# Patient Record
Sex: Female | Born: 1950 | Race: White | Hispanic: No | Marital: Married
Health system: Midwestern US, Community
[De-identification: ages and names within clinical notes are randomized; demographics above are authoritative.]

## PROBLEM LIST (undated history)

## (undated) DIAGNOSIS — I2510 Atherosclerotic heart disease of native coronary artery without angina pectoris: Secondary | ICD-10-CM

## (undated) DIAGNOSIS — Z8709 Personal history of other diseases of the respiratory system: Secondary | ICD-10-CM

## (undated) DIAGNOSIS — L989 Disorder of the skin and subcutaneous tissue, unspecified: Secondary | ICD-10-CM

## (undated) DIAGNOSIS — E785 Hyperlipidemia, unspecified: Secondary | ICD-10-CM

## (undated) DIAGNOSIS — R5383 Other fatigue: Secondary | ICD-10-CM

## (undated) DIAGNOSIS — F411 Generalized anxiety disorder: Secondary | ICD-10-CM

## (undated) DIAGNOSIS — J439 Emphysema, unspecified: Secondary | ICD-10-CM

## (undated) DIAGNOSIS — R140 Abdominal distension (gaseous): Secondary | ICD-10-CM

## (undated) DIAGNOSIS — L299 Pruritus, unspecified: Secondary | ICD-10-CM

## (undated) DIAGNOSIS — I10 Essential (primary) hypertension: Secondary | ICD-10-CM

## (undated) DIAGNOSIS — F32A Depression, unspecified: Secondary | ICD-10-CM

## (undated) DIAGNOSIS — M25551 Pain in right hip: Secondary | ICD-10-CM

## (undated) DIAGNOSIS — M549 Dorsalgia, unspecified: Secondary | ICD-10-CM

## (undated) DIAGNOSIS — I4891 Unspecified atrial fibrillation: Secondary | ICD-10-CM

## (undated) DIAGNOSIS — F410 Panic disorder [episodic paroxysmal anxiety] without agoraphobia: Secondary | ICD-10-CM

## (undated) DIAGNOSIS — M25562 Pain in left knee: Principal | ICD-10-CM

## (undated) DIAGNOSIS — D509 Iron deficiency anemia, unspecified: Secondary | ICD-10-CM

## (undated) DIAGNOSIS — F419 Anxiety disorder, unspecified: Secondary | ICD-10-CM

## (undated) DIAGNOSIS — R918 Other nonspecific abnormal finding of lung field: Secondary | ICD-10-CM

## (undated) DIAGNOSIS — D508 Other iron deficiency anemias: Secondary | ICD-10-CM

## (undated) DIAGNOSIS — E278 Other specified disorders of adrenal gland: Secondary | ICD-10-CM

## (undated) DIAGNOSIS — M47816 Spondylosis without myelopathy or radiculopathy, lumbar region: Principal | ICD-10-CM

## (undated) DIAGNOSIS — M25561 Pain in right knee: Secondary | ICD-10-CM

## (undated) DIAGNOSIS — E559 Vitamin D deficiency, unspecified: Secondary | ICD-10-CM

## (undated) DIAGNOSIS — D649 Anemia, unspecified: Secondary | ICD-10-CM

## (undated) DIAGNOSIS — E611 Iron deficiency: Secondary | ICD-10-CM

## (undated) DIAGNOSIS — I480 Paroxysmal atrial fibrillation: Secondary | ICD-10-CM

## (undated) HISTORY — PX: OTHER SURGICAL HISTORY: SHX169

## (undated) HISTORY — DX: Emphysema, unspecified: J43.9

## (undated) HISTORY — DX: Abdominal distension (gaseous): R14.0

## (undated) HISTORY — DX: Disorder of the skin and subcutaneous tissue, unspecified: L98.9

## (undated) HISTORY — DX: Essential (primary) hypertension: I10

## (undated) HISTORY — DX: Panic disorder (episodic paroxysmal anxiety): F41.0

## (undated) HISTORY — DX: Depression, unspecified: F32.A

## (undated) HISTORY — PX: APPENDECTOMY: SHX54

## (undated) HISTORY — DX: Atherosclerotic heart disease of native coronary artery without angina pectoris: I25.10

## (undated) HISTORY — DX: Personal history of other diseases of the respiratory system: Z87.09

## (undated) HISTORY — DX: Generalized anxiety disorder: F41.1

## (undated) HISTORY — PX: NECK SURGERY: SHX720

## (undated) HISTORY — DX: Hyperlipidemia, unspecified: E78.5

## (undated) HISTORY — DX: Dorsalgia, unspecified: M54.9

## (undated) HISTORY — DX: Pain in right hip: M25.551

## (undated) HISTORY — DX: Unspecified atrial fibrillation: I48.91

## (undated) HISTORY — DX: Other fatigue: R53.83

## (undated) HISTORY — DX: Pruritus, unspecified: L29.9

---

## 1998-02-24 HISTORY — PX: BREAST BIOPSY: SHX20

## 1998-02-24 HISTORY — PX: BREAST EXCISIONAL BIOPSY: SUR124

## 2016-01-03 NOTE — ED Provider Notes (Signed)
Formatting of this note is different from the original.      DOS: 01/03/2016    Chief Complaint   Patient presents with   ? Medical Evaluation     pt reports pain in bilateral arms that radiates up to shoulder with numbness and tingling in fingers     HPI  The patient is a 65 y.o. female who presents today with Medical Evaluation (pt reports pain in bilateral arms that radiates up to shoulder with numbness and tingling in fingers)  HPI Comments: Chief complaint of shoulder pain.  Patient states that 5 days ago she was sitting at her kitchen table when she had a sudden sensation of bilateral upper extremity numbness and tingling with a brief episode of "heartburn".  She was a little bit lightheaded, but discomfort past after only a minute or so.    Last night, she felt like she was having difficulty and left shoulder and arm pain and with no associated symptoms.    This morning, she had another episode of the lateral upper extremity discomfort and tingling which again only lasted a few moments, now has some left shoulder and arm pain.  Today she also had the associated epigastric discomfort and lightheadedness, but these have passed.     Patient has not had shortness of breath, diaphoresis, vomiting or nausea.      Patient called her PCP and was referred to the emergency room for concerns about cardiac issues.  Patient has no history of cardiac disease.  She does smoke cigarettes and has been treated occasionally for hypertension.      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  No increased neck pain recently.    The history is provided by the patient.   Medical Evaluation   Associated symptoms: no abdominal pain, no chest pain, no congestion, no cough, no diarrhea, no ear pain, no fever, no headaches, no nausea, no rash, no rhinorrhea, no shortness of breath, no sore throat and no vomiting        Review of Systems    Review of  Systems   Constitutional: Negative for activity change, chills and fever.   HENT: Negative for congestion, ear pain, rhinorrhea and sore throat.    Eyes: Negative for pain, redness and visual disturbance.   Respiratory: Positive for chest tightness. Negative for cough and shortness of breath.    Cardiovascular: Negative for chest pain and leg swelling.   Gastrointestinal: Negative for abdominal pain, diarrhea, nausea and vomiting.   Genitourinary: Negative for dysuria, flank pain and pelvic pain.   Musculoskeletal: Negative for back pain and neck pain.   Skin: Negative for color change and rash.   Neurological: Positive for light-headedness. Negative for dizziness, syncope, weakness and headaches.   Psychiatric/Behavioral: Negative for agitation and behavioral problems.          The patient's past medical, family and social history was reviewed and updated as needed.        No Known Allergies    Vital Signs  Temp: 36.2 C (97.2 F)  Temp src: Temporal  Pulse: 87  Heart Rate: 82 BPM  Resp: 16  SpO2: 98 %  BP: 124/55   O2 Device: None (Room air)    Physical Exam   Constitutional: She is oriented to person, place, and time. She appears well-developed and well-nourished. No distress.   HENT:   Mouth/Throat: Oropharynx is  clear and moist. No oropharyngeal exudate.   Eyes: Conjunctivae and EOM are normal. Pupils are equal, round, and reactive to light.   Cardiovascular: Regular rhythm.    Pulmonary/Chest: Effort normal and breath sounds normal. No respiratory distress. She has no wheezes. She has no rales. She exhibits no tenderness.   Abdominal: She exhibits no distension and no mass. There is no tenderness. There is no rebound and no guarding.   Musculoskeletal: Normal range of motion. She exhibits tenderness (mild diffuse left shoulder soft tissue tenderness).   Neurological: She is alert and oriented to person, place, and time.   Skin: Skin is warm and dry.   Psychiatric: She has a normal mood and affect. Her behavior  is normal. Judgment and thought content normal.   Nursing note and vitals reviewed.    RESULTS    Radiology orders: None      ED Lab Results   Labs Reviewed   BASIC METABOLIC PANEL (BMP)       Result Value Status    Sodium 144   Final    Potassium 3.9   Final    Chloride 104   Final    CO2 30   Final    BUN 16   Final    Creatinine 0.65   Final    GFR, Calculated 95   Final    Calcium 9.5   Final    Calculated Calcium 8.9   Final    Glucose, Serum 98   Final    Fasting? Unknown   Final   HEMAGRAM AND DIFFERENTIAL    WBC 6.62   Final    RBC 4.18   Final    Hemoglobin 13.2   Final    HCT 39.8   Final    MCV 95   Final    MCH 31.6   Final    MCHC 33.2   Final    RDW-CV 13.1   Final    RDW-SD 45.6   Final    PLT 276   Final    MPV 9.5   Final    Neutrophils 58.6   Final    Lymphocytes 29.8   Final    Monocytes 6.6   Final    Eosinophils 3.6   Final    Basophils 0.8   Final    Immature Grans 0.6   Final    ABS Neutrophils 3.88   Final    ABS Lymphs 1.97   Final    ABS Monocytes 0.44   Final    ABS Eosinophils 0.24   Final    ABS Basophils 0.05   Final    ABS Immature Grans 0.04   Final    Type of Diff: Automated   Final   TROPONIN I    Troponin I <0.034   Final         Relevant Data     Procedures        ED COURSE   A medical screening exam was performed.  On exam , patient looks well with normal vital signs.  She is having 5/10 left shoulder and arm pain currently, with some mild diffuse left shoulder reproducible tenderness and discomfort on movement.  Exam otherwise unremarkable.    EKG today shows no ischemic changes or abnormalities, labs including BMP, CBC and troponin are normal.  With symptoms occurring at rest and involving numbness and tingling in both of the upper extremities, this is sounds unlikely to be cardiac, especially  given normal workup today.    Patient declines Motrin or Tylenol for her shoulder discomfort and will take her own Excedrin at home and follow up with PCP as needed.      ASSESSMENT AND  PLAN  Final diagnoses:   Left shoulder pain, unspecified chronicity     DISPOSITION: Discharged  The patient's pain was managed to an adequate level weighing risk vs. benefit of further medications. Upon departure from the Emergency Department, the patient's pain was 4 on a zero to ten scale.     Any further pain treatment will be at the discretion of the provider following up with the patient based on their clinical assessment.    Condition at departure from the Emergency Department: Stable     PCP:  Duffy BruceJudith Steinberg    MDM  Number of Diagnoses or Management Options  Diagnosis management comments: 4      Amount and/or Complexity of Data Reviewed  Clinical lab tests: ordered  Tests in the medicine section of CPT: reviewed and ordered  Review and summarize past medical records: yes    Rudi RummageJohn Ellis      01/06/2016 10:47    No flowsheet data found.                          Electronically signed by Deretha EmoryJerard, Paul B, PA at 01/06/2016 10:47 EST

## 2016-01-03 NOTE — ED Notes (Signed)
Formatting of this note might be different from the original.  TCALL: (RE-ENTERED)[id number] REFERRED TO ED BY STEINBERG. LEFT ARM TINGLE AND LEFT ARM NUMBNESS. (DJP)  Electronically signed by Era BumpersParent, Daniel at 01/03/2016 23:26 EST

## 2016-01-03 NOTE — ED Notes (Signed)
Formatting of this note might be different from the original.  Blood drawn via saline lock per protocol,  tiger, green and purple tube(s) sent to lab per order.  Electronically signed by Caryl NeverShearer, Daniel at 01/03/2016 11:29 EST

## 2016-01-03 NOTE — ED Notes (Signed)
Formatting of this note might be different from the original.  Patient A+O, speech clear, respirations unlabored, skin pink, warm and dry, denies lightheadedness, chest pain, shortness of breath and nausea, gait steady, NAD.  Discharge instructions provided and reviewed with patient, demonstrated understanding and discharged to home accompanied by family.  Electronically signed by Corliss MarcusBrooks, William A, RN at 01/03/2016 12:41 EST

## 2017-08-31 ENCOUNTER — Other Ambulatory Visit: Payer: Self-pay | Admitting: Family Medicine

## 2017-08-31 DIAGNOSIS — Z1231 Encounter for screening mammogram for malignant neoplasm of breast: Secondary | ICD-10-CM

## 2017-09-04 ENCOUNTER — Telehealth: Payer: Self-pay | Admitting: *Deleted

## 2017-09-04 NOTE — Telephone Encounter (Signed)
Received referral for low dose lung cancer screening CT scan. Message left at phone number listed in EMR for patient to call me back to facilitate scheduling scan.  

## 2017-09-04 NOTE — Telephone Encounter (Signed)
Formatting of this note might be different from the original.  Received referral for low dose lung cancer screening CT scan. Message left at phone number listed in EMR for patient to call me back to facilitate scheduling scan.   Electronically signed by Jonne PlyPerkins, Shawn P, RN at 09/04/2017  1:12 PM EDT

## 2017-09-07 ENCOUNTER — Telehealth: Payer: Self-pay | Admitting: *Deleted

## 2017-09-07 DIAGNOSIS — Z122 Encounter for screening for malignant neoplasm of respiratory organs: Secondary | ICD-10-CM

## 2017-09-07 DIAGNOSIS — Z87891 Personal history of nicotine dependence: Secondary | ICD-10-CM

## 2017-09-07 NOTE — Telephone Encounter (Signed)
Received referral for initial lung cancer screening scan. Contacted patient and obtained smoking history,(current, 33.75 pack year) as well as answering questions related to screening process. Patient denies signs of lung cancer such as weight loss or hemoptysis. Patient denies comorbidity that would prevent curative treatment if lung cancer were found. Patient is scheduled for shared decision making visit and CT scan on 09/17/17.

## 2017-09-16 ENCOUNTER — Encounter: Payer: Self-pay | Admitting: Nurse Practitioner

## 2017-09-17 ENCOUNTER — Inpatient Hospital Stay: Payer: Managed Care, Other (non HMO) | Attending: Nurse Practitioner | Admitting: Nurse Practitioner

## 2017-09-17 ENCOUNTER — Ambulatory Visit
Admission: RE | Admit: 2017-09-17 | Discharge: 2017-09-17 | Disposition: A | Discharge status: Home / Self Care | Payer: Managed Care, Other (non HMO) | Source: Ambulatory Visit | Attending: Nurse Practitioner | Admitting: Nurse Practitioner

## 2017-09-17 ENCOUNTER — Ambulatory Visit: Admission: RE | Admit: 2017-09-17 | Payer: Managed Care, Other (non HMO) | Source: Ambulatory Visit

## 2017-09-17 DIAGNOSIS — J439 Emphysema, unspecified: Secondary | ICD-10-CM | POA: Insufficient documentation

## 2017-09-17 DIAGNOSIS — Z122 Encounter for screening for malignant neoplasm of respiratory organs: Secondary | ICD-10-CM | POA: Insufficient documentation

## 2017-09-17 DIAGNOSIS — Z87891 Personal history of nicotine dependence: Secondary | ICD-10-CM | POA: Diagnosis present

## 2017-09-17 DIAGNOSIS — I700 Atherosclerosis of aorta: Secondary | ICD-10-CM | POA: Diagnosis not present

## 2017-09-17 DIAGNOSIS — N200 Calculus of kidney: Secondary | ICD-10-CM | POA: Diagnosis not present

## 2017-09-17 IMAGING — CT CT CHEST LUNG CANCER SCREENING LOW DOSE W/O CM
2 of 5 series · 15 of 40 positions shown, 18 images · non-contrast
Comparison: None.

CLINICAL DATA: Current smoker, 34 pack-year history.

EXAM:
CT CHEST WITHOUT CONTRAST LOW-DOSE FOR LUNG CANCER SCREENING
TECHNIQUE: Multidetector CT imaging of the chest was performed following the
standard protocol without IV contrast.

[Series 3: lung · axial · 0.55mm/px · z∈[-1291,-983]mm · 12 of 344 slices shown, 15 images (1 of 2)]
[im 18/344  mediastinal]
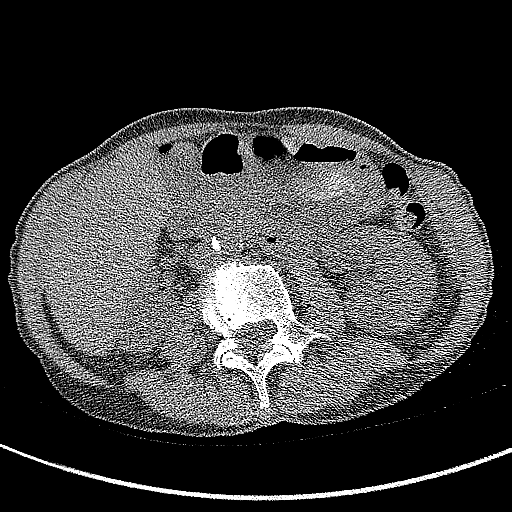
[im 18/344  lung]
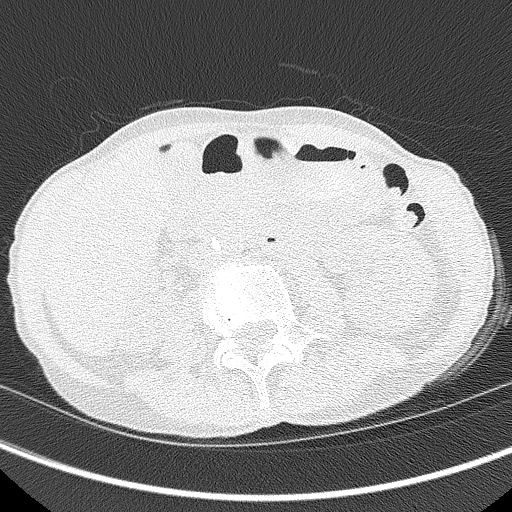
[im 52/344  lung]
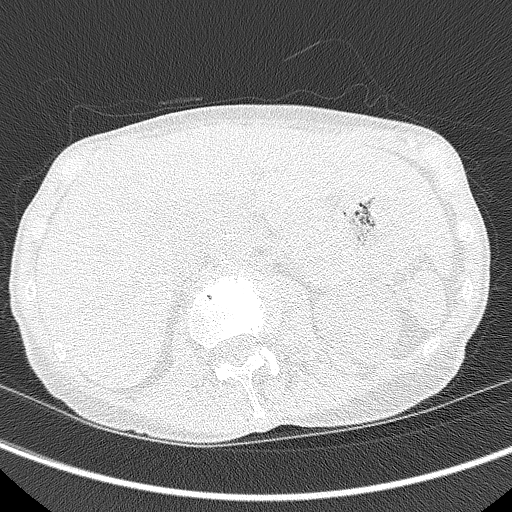
[im 69/344  lung]
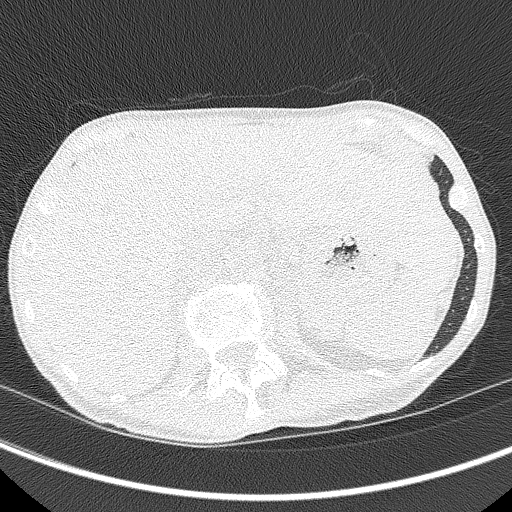
[im 103/344  lung]
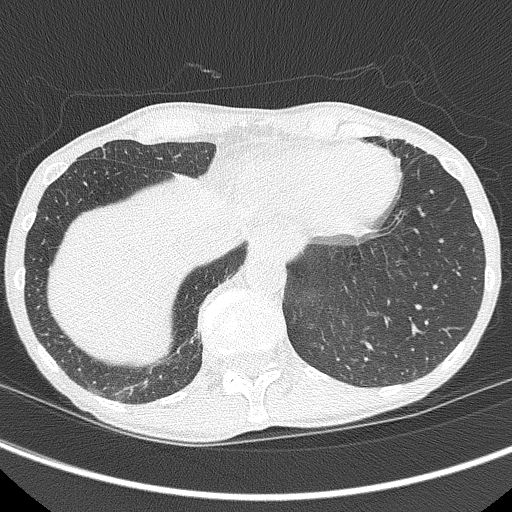
[im 138/344  mediastinal]
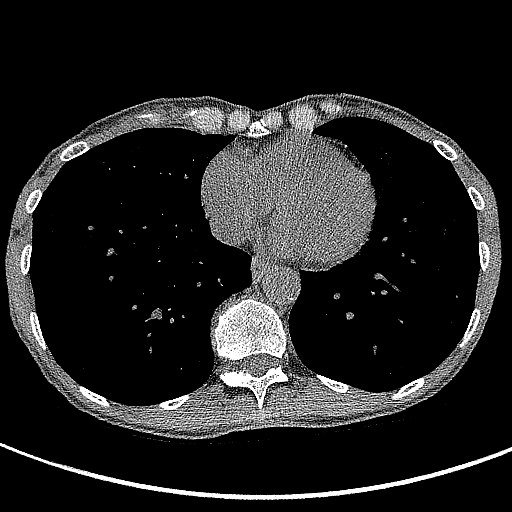
[im 138/344  lung]
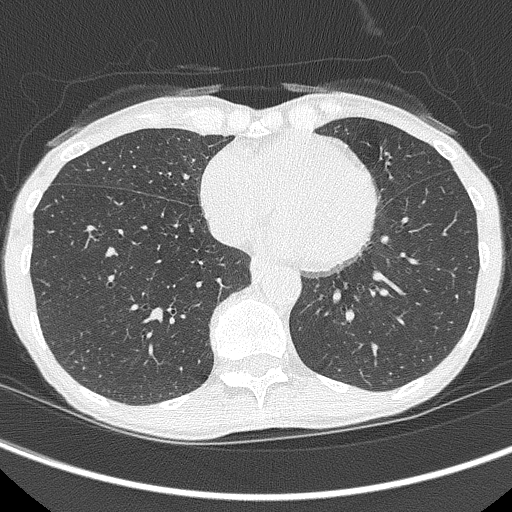
[im 155/344  lung]
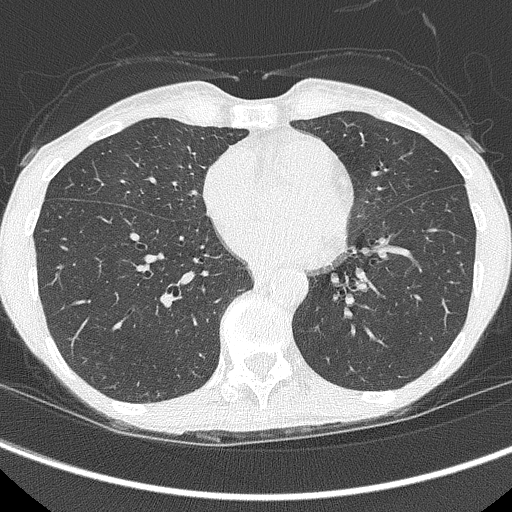
[im 189/344  lung]
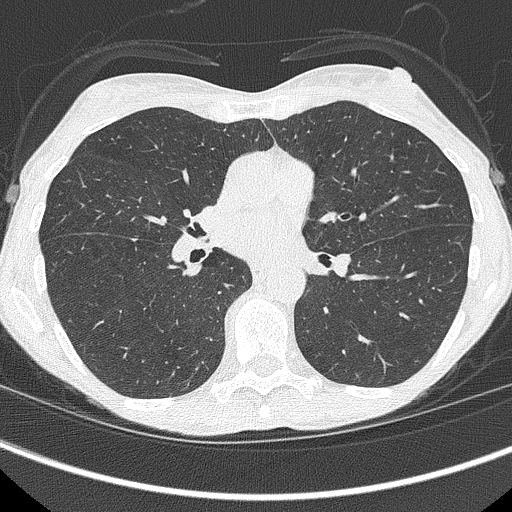
[im 206/344  lung]
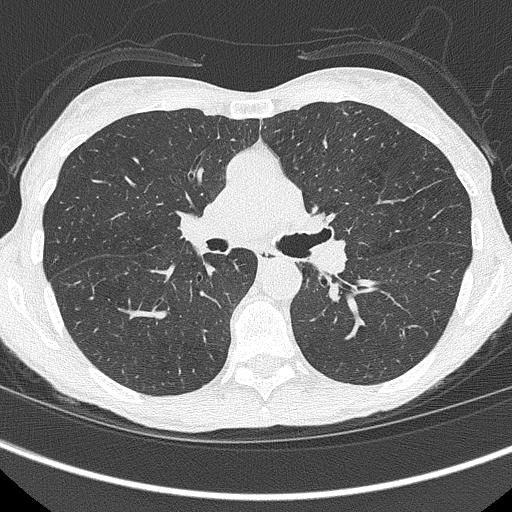
[im 241/344  mediastinal]
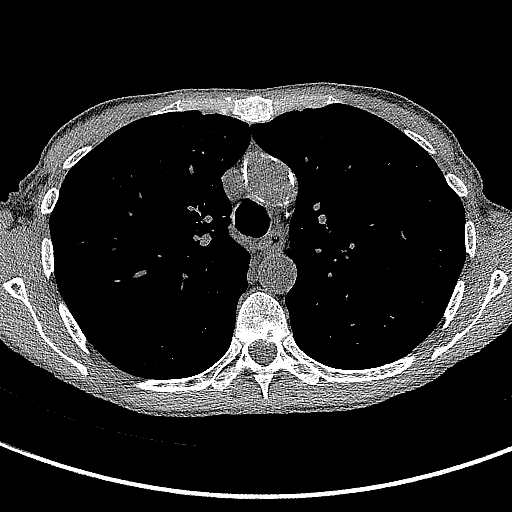
[im 241/344  lung]
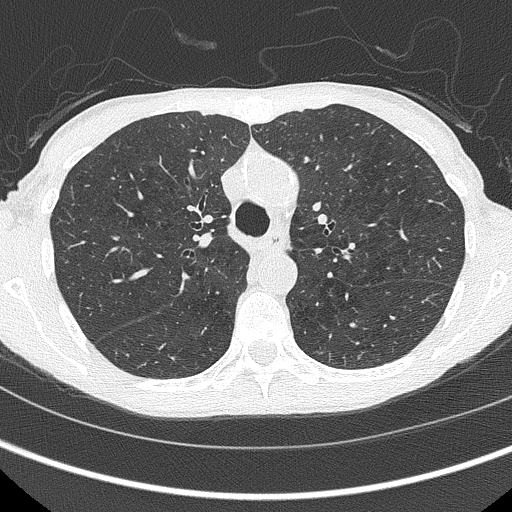
[im 275/344  lung]
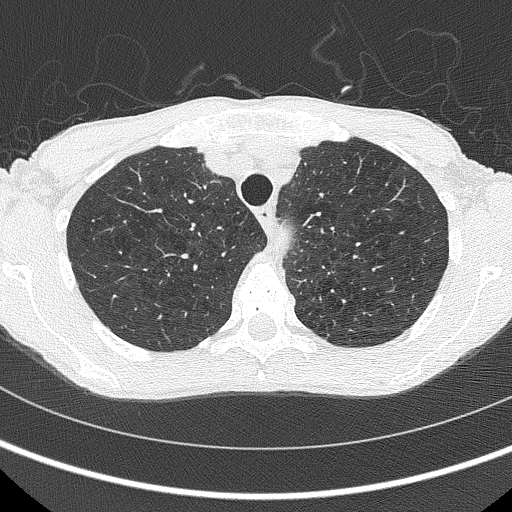
[im 292/344  lung]
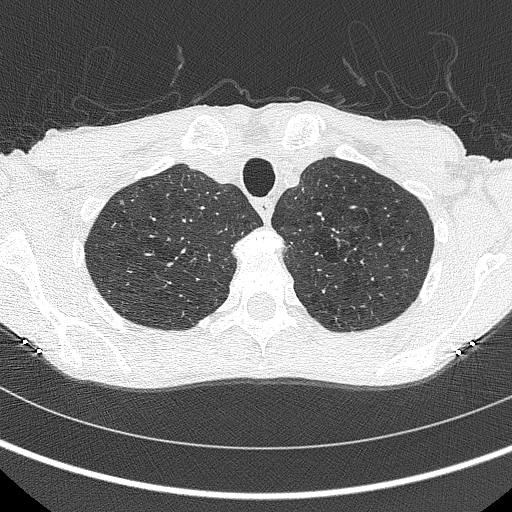
[im 326/344  lung]
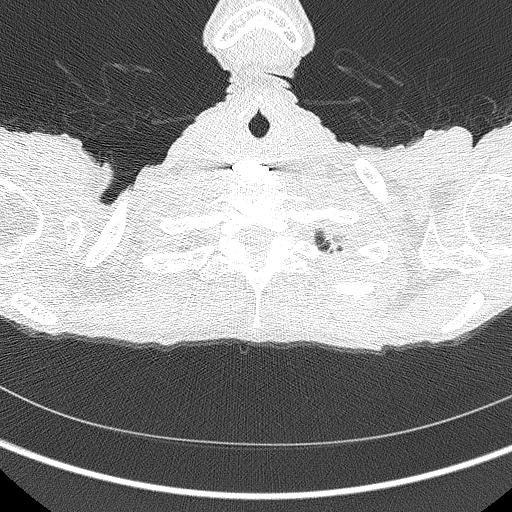

[Series 4: lung · coronal · 0.55mm/px · 3 of 203 slices shown (2 of 2)]
[im 41/203  lung]
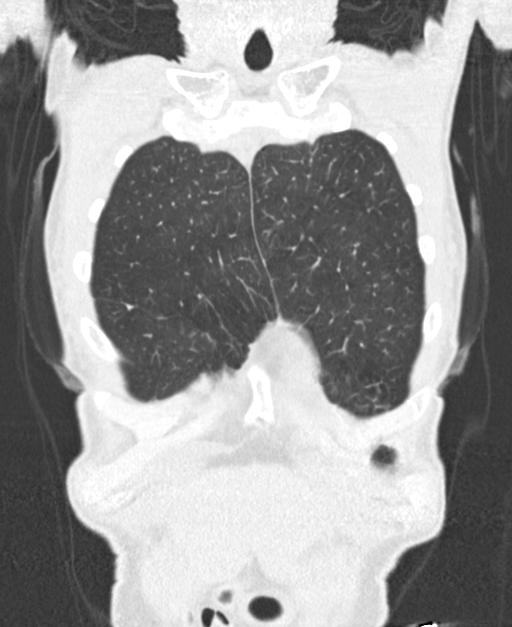
[im 81/203  lung]
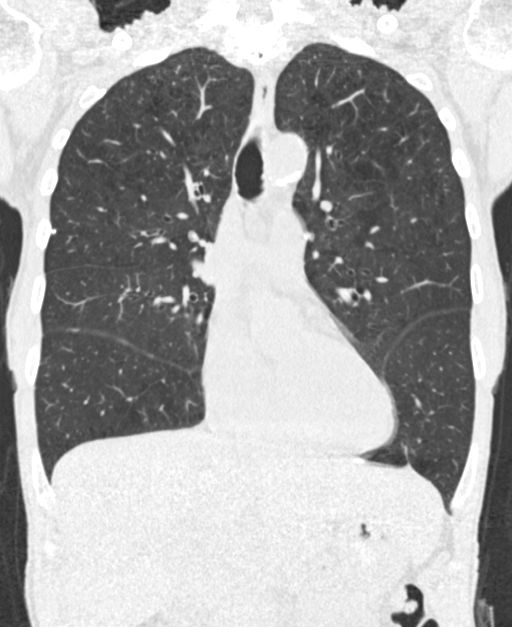
[im 122/203  lung]
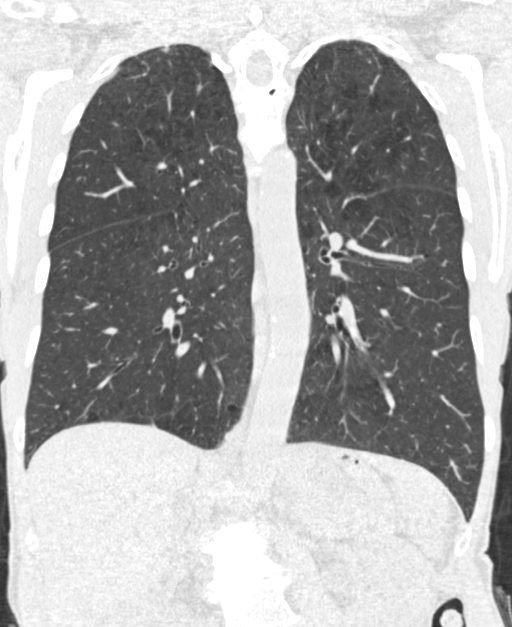

[15 of 40 positions shown; findings below may reference images not displayed]

FINDINGS: Cardiovascular: Atherosclerotic calcification of the arterial
vasculature. Heart size normal. No pericardial effusion.

Mediastinum/Nodes: Mediastinal lymph nodes are not enlarged by CT
size criteria. Hilar regions are difficult to definitively evaluate
without IV contrast. No axillary adenopathy. Esophagus is grossly
unremarkable.

Lungs/Pleura: Biapical pleuroparenchymal scarring. Moderate
centrilobular emphysema. Noncalcified pulmonary nodules measure
mm or less in size. There are perifissural nodules which are likely
subpleural lymph nodes. No pleural fluid. Mucoid impaction in the
anterior segment right upper lobe. Airway is otherwise unremarkable.

Upper Abdomen: Visualized portions of the liver, adrenal glands and
right kidney are unremarkable. Punctate stone in the left kidney.
Visualized portions of the spleen, pancreas and stomach are grossly
unremarkable.

Musculoskeletal: Degenerative changes in the spine. No worrisome
lytic or sclerotic lesions.
IMPRESSION: 1. Lung-RADS 2, benign appearance or behavior. Continue annual
screening with low-dose chest CT without contrast in 12 months.
2.  Aortic atherosclerosis ([GI]-170.0).
3.  Emphysema ([GI]-[GI]).
4. Punctate left renal stone.

## 2017-09-17 NOTE — Progress Notes (Signed)
In accordance with CMS guidelines, patient has met eligibility criteria including age, absence of signs or symptoms of lung cancer.  Social History   Tobacco Use  . Smoking status: Current Every Day Smoker    Packs/day: 0.75    Years: 45.00    Pack years: 33.75    Types: Cigarettes  Substance Use Topics  . Alcohol use: Not on file  . Drug use: Not on file      A shared decision-making session was conducted prior to the performance of CT scan. This includes one or more decision aids, includes benefits and harms of screening, follow-up diagnostic testing, over-diagnosis, false positive rate, and total radiation exposure.   Counseling on the importance of adherence to annual lung cancer LDCT screening, impact of co-morbidities, and ability or willingness to undergo diagnosis and treatment is imperative for compliance of the program.   Counseling on the importance of continued smoking cessation for former smokers; the importance of smoking cessation for current smokers, and information about tobacco cessation interventions have been given to patient including [place] and [employer] quit [place] programs.   Written order for lung cancer screening with LDCT has been given to the patient and any and all questions have been answered to the best of my abilities.    Yearly follow up will be coordinated by Burgess Estelle, Thoracic Navigator.  Beckey Rutter, DNP, AGNP-C [place] [facility] [phone] (work cell) [phone] (office) 09/17/17 4:35 PM

## 2017-09-17 NOTE — Progress Notes (Signed)
Formatting of this note is different from the original.  In accordance with CMS guidelines, patient has met eligibility criteria including age, absence of signs or symptoms of lung cancer.    Social History     Tobacco Use   ? Smoking status: Current Every Day Smoker     Packs/day: 0.75     Years: 45.00     Pack years: 33.75     Types: Cigarettes   Substance Use Topics   ? Alcohol use: Not on file   ? Drug use: Not on file       A shared decision-making session was conducted prior to the performance of CT scan. This includes one or more decision aids, includes benefits and harms of screening, follow-up diagnostic testing, over-diagnosis, false positive rate, and total radiation exposure.    Counseling on the importance of adherence to annual lung cancer LDCT screening, impact of co-morbidities, and ability or willingness to undergo diagnosis and treatment is imperative for compliance of the program.    Counseling on the importance of continued smoking cessation for former smokers; the importance of smoking cessation for current smokers, and information about tobacco cessation interventions have been given to patient including [facility] and [facility] quit [place] programs.    Written order for lung cancer screening with LDCT has been given to the patient and any and all questions have been answered to the best of my abilities.     Yearly follow up will be coordinated by Glenna FellowsShawn Perkins, Thoracic Navigator.    Consuello MasseLauren Allen, DNP, AGNP-C  [facility]  [phone] (work cell)  [phone] (office)  09/17/17  4:35 PM      Electronically signed by Alinda DoomsAllen, Lauren G, NP at 09/17/2017  4:36 PM EDT

## 2017-09-21 ENCOUNTER — Telehealth: Payer: Self-pay | Admitting: *Deleted

## 2017-09-21 ENCOUNTER — Ambulatory Visit: Payer: Self-pay | Admitting: Internal Medicine

## 2017-09-21 DIAGNOSIS — Z0289 Encounter for other administrative examinations: Secondary | ICD-10-CM

## 2017-09-21 NOTE — Telephone Encounter (Signed)
Notified patient of LDCT lung cancer screening program results with recommendation for 12 month follow up imaging. Also notified of incidental findings noted below and is encouraged to discuss further with PCP who will receive a copy of this note and/or the CT report. Patient verbalizes understanding.   IMPRESSION: 1. Lung-RADS 2, benign appearance or behavior. Continue annual screening with low-dose chest CT without contrast in 12 months. 2.  Aortic atherosclerosis (ICD10-170.0). 3.  Emphysema (ICD10-J43.9). 4. Punctate left renal stone.

## 2018-09-27 ENCOUNTER — Telehealth: Payer: Self-pay | Admitting: *Deleted

## 2018-09-27 NOTE — Telephone Encounter (Signed)
Left message for patient to notify them that it is time to schedule annual low dose lung cancer screening CT scan. Instructed patient to call back to verify information prior to the scan being scheduled.  

## 2018-10-15 ENCOUNTER — Other Ambulatory Visit: Payer: Self-pay | Admitting: Family Medicine

## 2018-10-15 DIAGNOSIS — Z1231 Encounter for screening mammogram for malignant neoplasm of breast: Secondary | ICD-10-CM

## 2018-10-15 DIAGNOSIS — Z13820 Encounter for screening for osteoporosis: Secondary | ICD-10-CM

## 2018-11-30 ENCOUNTER — Telehealth: Payer: Self-pay

## 2018-11-30 NOTE — Telephone Encounter (Signed)
Left voicemail for pt to inform her that it is time for her annual lung cancer screening. Instructed pt to call back and confirm information prior to CT scan being scheduled. 

## 2018-12-08 ENCOUNTER — Encounter: Payer: Self-pay | Admitting: *Deleted

## 2019-01-11 ENCOUNTER — Ambulatory Visit
Admission: RE | Admit: 2019-01-11 | Discharge: 2019-01-11 | Disposition: A | Discharge status: Home / Self Care | Payer: Managed Care, Other (non HMO) | Source: Ambulatory Visit | Attending: Family Medicine | Admitting: Family Medicine

## 2019-01-11 ENCOUNTER — Other Ambulatory Visit: Payer: Self-pay

## 2019-01-11 ENCOUNTER — Encounter (INDEPENDENT_AMBULATORY_CARE_PROVIDER_SITE_OTHER): Payer: Self-pay

## 2019-01-11 DIAGNOSIS — Z1231 Encounter for screening mammogram for malignant neoplasm of breast: Secondary | ICD-10-CM

## 2019-01-11 DIAGNOSIS — Z13820 Encounter for screening for osteoporosis: Secondary | ICD-10-CM | POA: Insufficient documentation

## 2019-01-11 IMAGING — MG DIGITAL SCREENING BILAT W/ TOMO W/ CAD
8 series · 9 of 24 positions shown · non-contrast
Comparison: None.

CLINICAL DATA: Screening.

EXAM:
DIGITAL SCREENING BILATERAL MAMMOGRAM WITH TOMO AND CAD

[R MLO synth-2D]
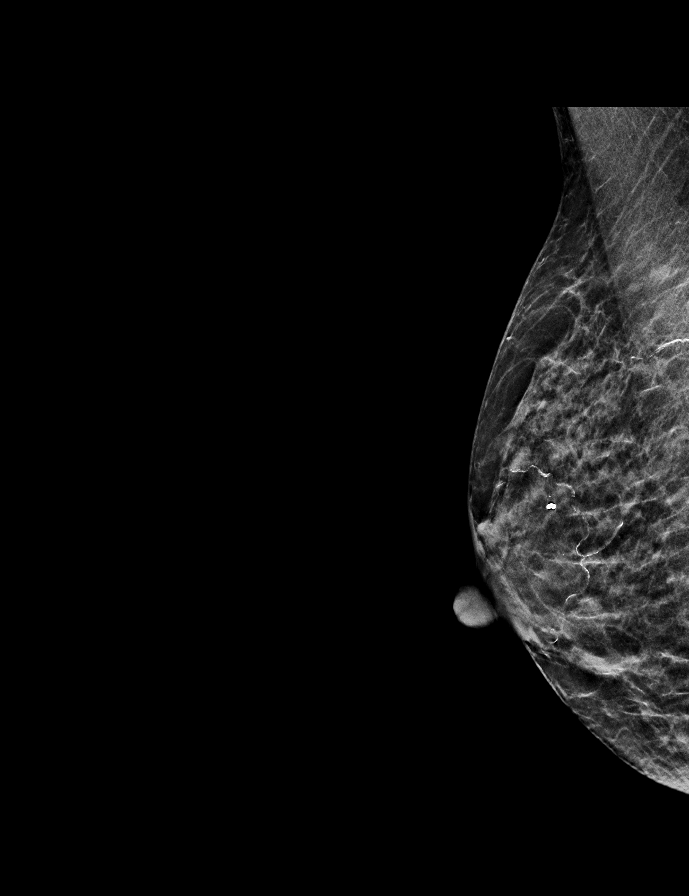

[L CC synth-2D]
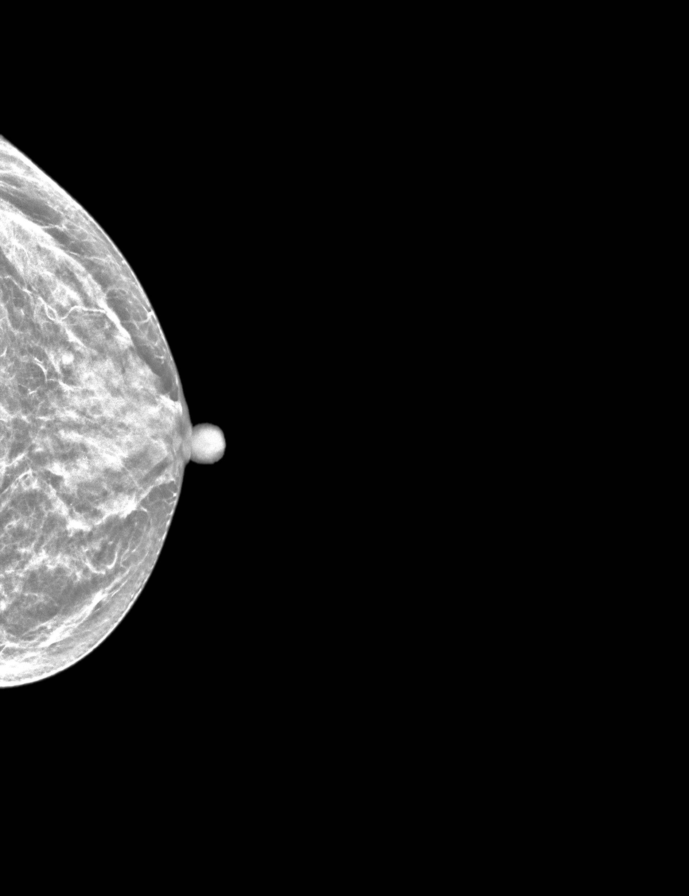

[L MLO synth-2D]
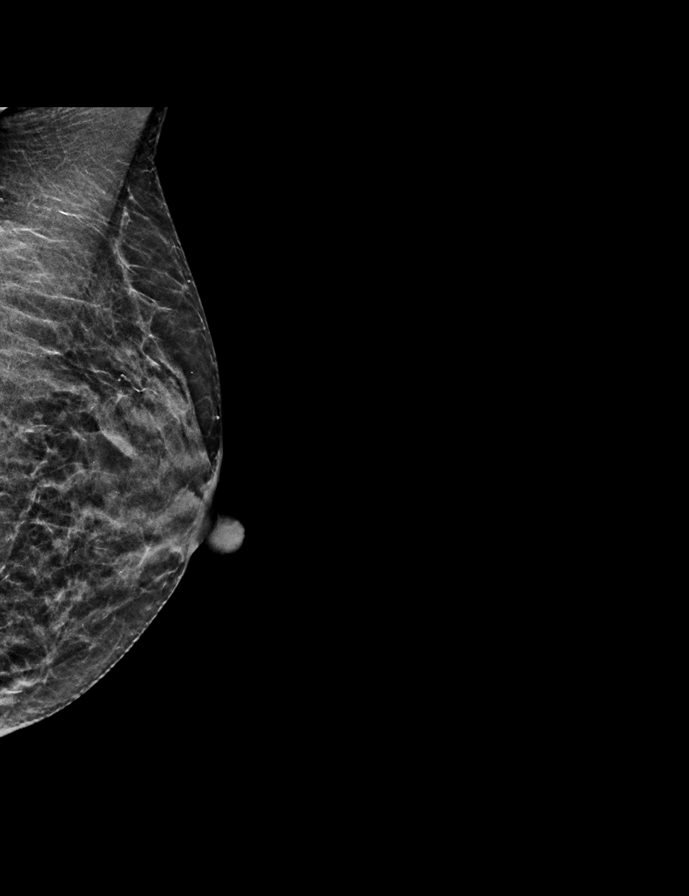

[R CC synth-2D]
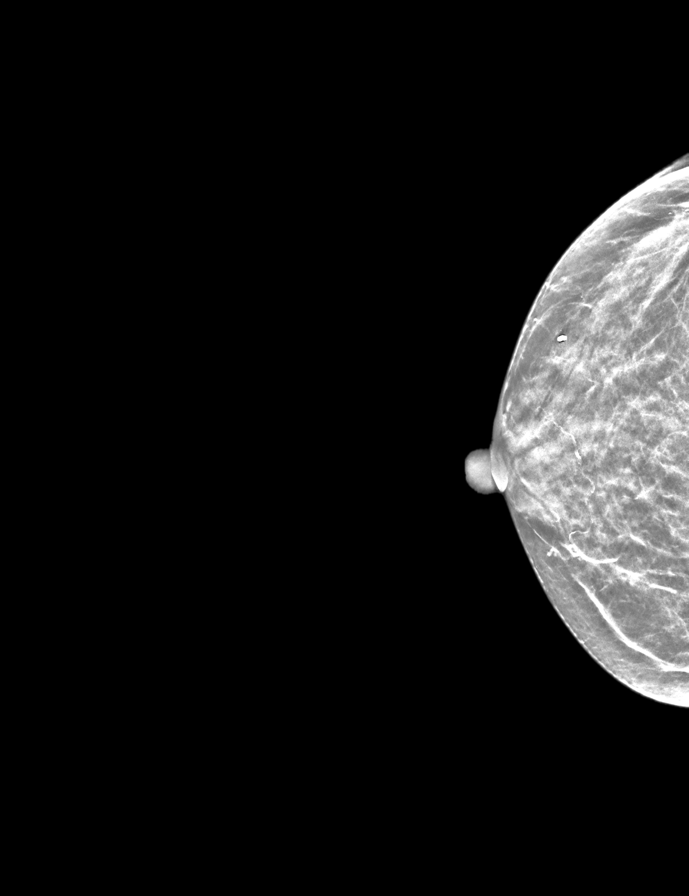

[R CC tomo · 2 of 37 frames shown]
[frame 13/37]
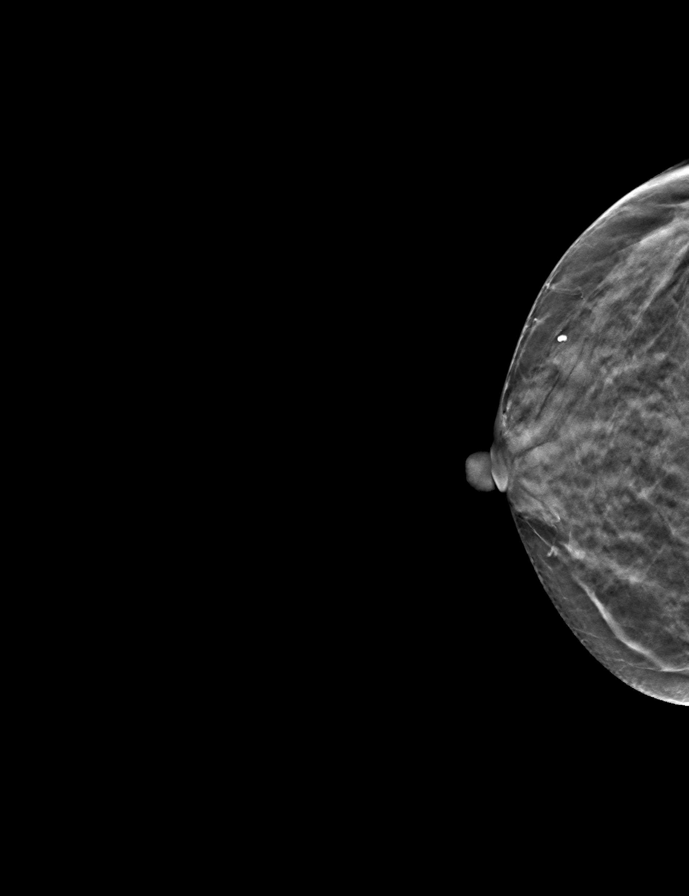
[frame 19/37]
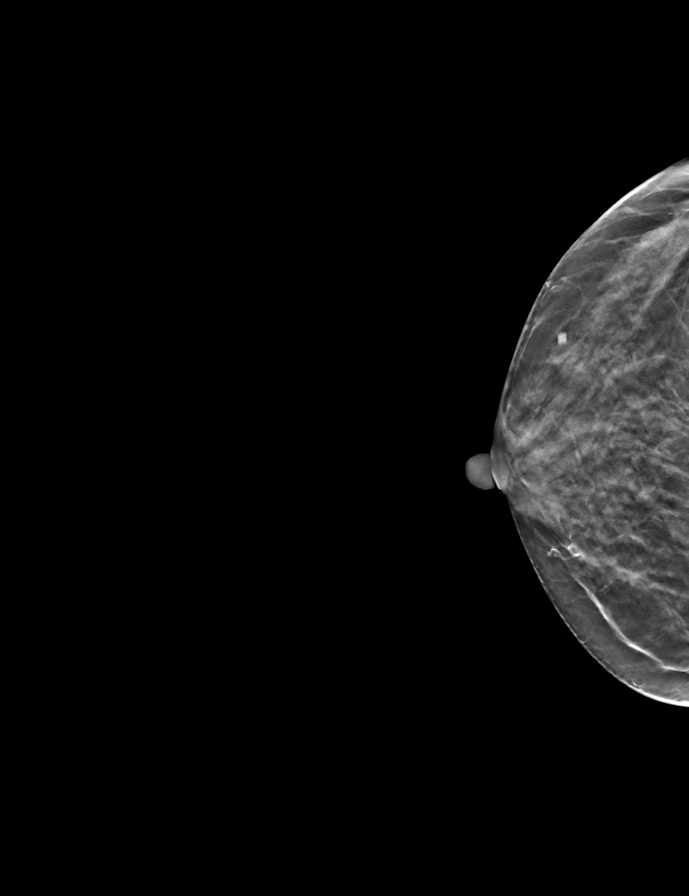

[R MLO tomo · tomo slice 17/34.0]
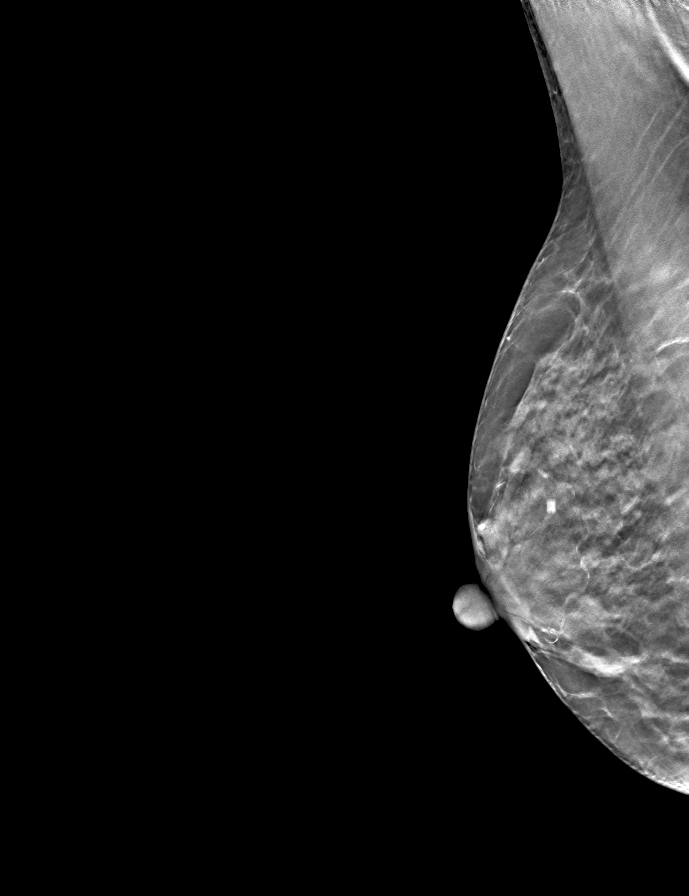

[L CC tomo · tomo slice 21/40.0]
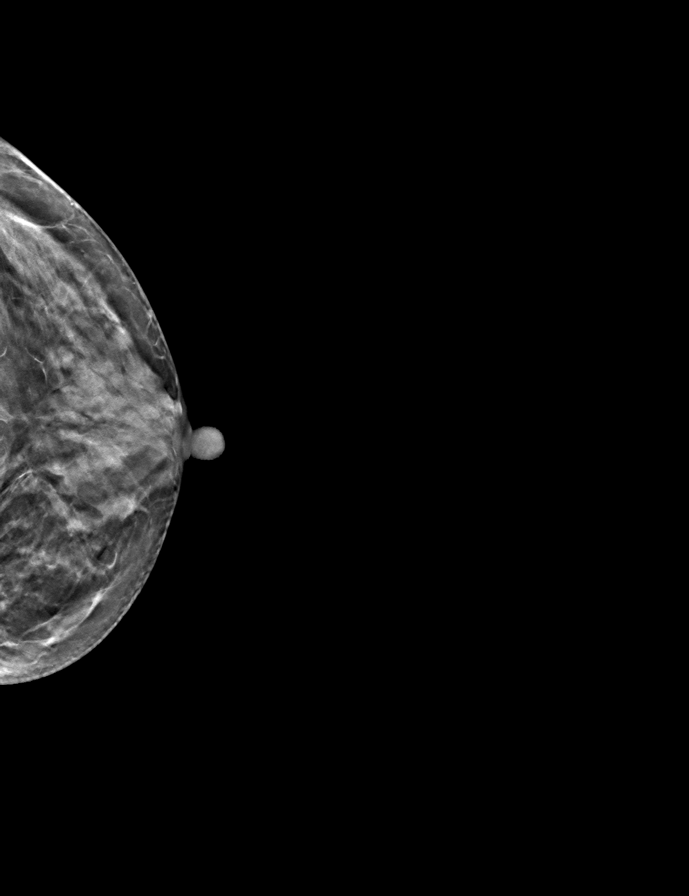

[L MLO tomo · tomo slice 19/36.0]
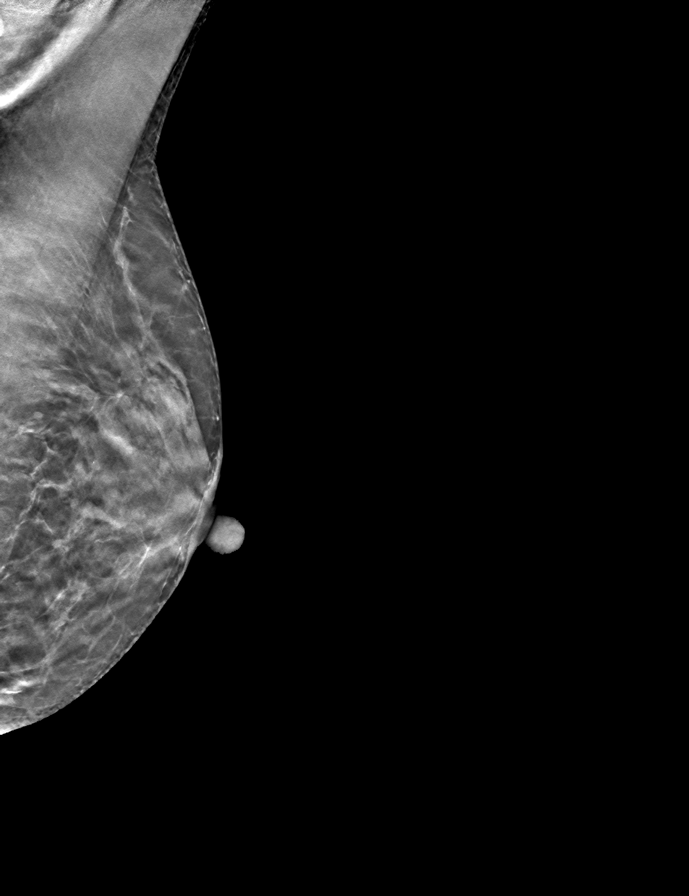

[9 of 24 positions shown; findings below may reference images not displayed]

ACR Breast Density Category c: The breast tissue is heterogeneously
dense, which may obscure small masses
FINDINGS: There are no findings suspicious for malignancy. Images were
processed with CAD.
IMPRESSION: No mammographic evidence of malignancy. A result letter of this
screening mammogram will be mailed directly to the patient.

RECOMMENDATION:
Screening mammogram in one year. (Code:[4W])

BI-RADS CATEGORY  1: Negative.

## 2019-01-12 ENCOUNTER — Telehealth: Payer: Self-pay | Admitting: *Deleted

## 2019-01-12 DIAGNOSIS — Z122 Encounter for screening for malignant neoplasm of respiratory organs: Secondary | ICD-10-CM

## 2019-01-12 DIAGNOSIS — Z87891 Personal history of nicotine dependence: Secondary | ICD-10-CM

## 2019-01-12 NOTE — Telephone Encounter (Signed)
Patient has been notified that annual lung cancer screening low dose CT scan is due currently or will be in near future. Confirmed that patient is within the age range of 55-77, and asymptomatic, (no signs or symptoms of lung cancer). Patient denies illness that would prevent curative treatment for lung cancer if found. Verified smoking history, (current, 34.25 pack year). The shared decision making visit was done 09/17/17. Patient is agreeable for CT scan being scheduled.

## 2019-02-01 ENCOUNTER — Ambulatory Visit
Admission: RE | Admit: 2019-02-01 | Discharge: 2019-02-01 | Disposition: A | Discharge status: Home / Self Care | Payer: Managed Care, Other (non HMO) | Source: Ambulatory Visit | Attending: Nurse Practitioner | Admitting: Nurse Practitioner

## 2019-02-01 ENCOUNTER — Other Ambulatory Visit: Payer: Self-pay

## 2019-02-01 DIAGNOSIS — Z122 Encounter for screening for malignant neoplasm of respiratory organs: Secondary | ICD-10-CM | POA: Diagnosis not present

## 2019-02-01 DIAGNOSIS — Z87891 Personal history of nicotine dependence: Secondary | ICD-10-CM | POA: Diagnosis present

## 2019-02-01 IMAGING — CT CT CHEST LUNG CANCER SCREENING LOW DOSE W/O CM
1 of 3 series · 15 of 40 positions shown, 19 images · non-contrast
Comparison: [DATE].

CLINICAL DATA: Current smoker, 34 pack-year history.

EXAM:
CT CHEST WITHOUT CONTRAST LOW-DOSE FOR LUNG CANCER SCREENING
TECHNIQUE: Multidetector CT imaging of the chest was performed following the
standard protocol without IV contrast.

[Series 2: axial st · axial · 0.57mm/px · z∈[-698,-414]mm · 15 of 63 slices shown, 19 images]
[im 3/63  mediastinal]
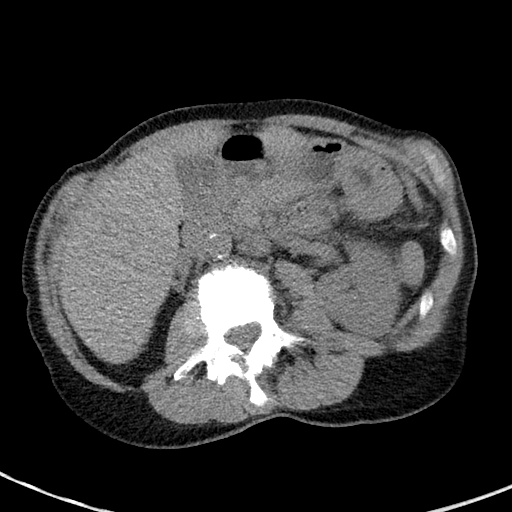
[im 3/63  lung]
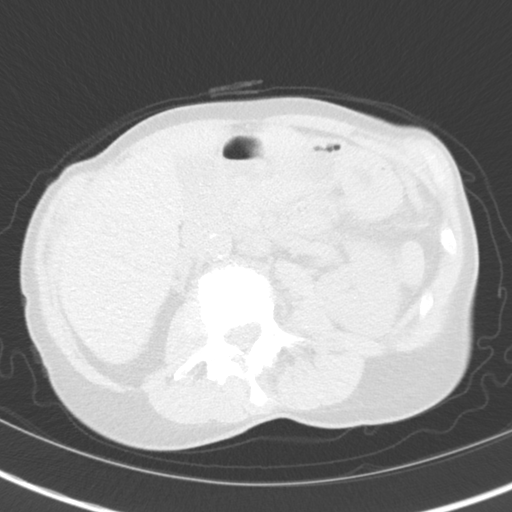
[im 8/63  lung]
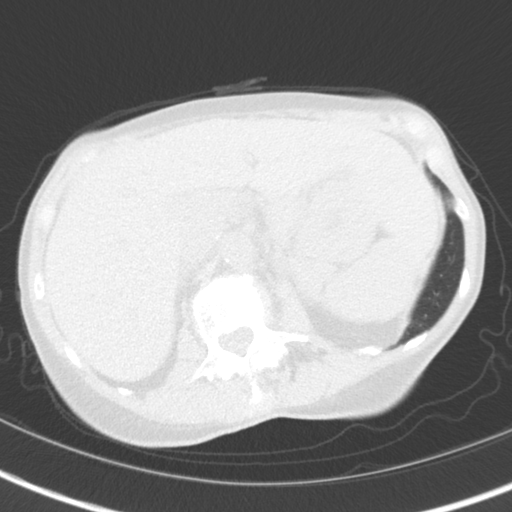
[im 13/63  lung]
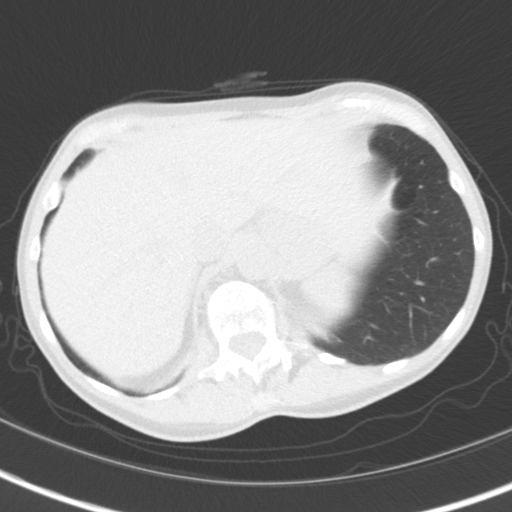
[im 16/63  lung]
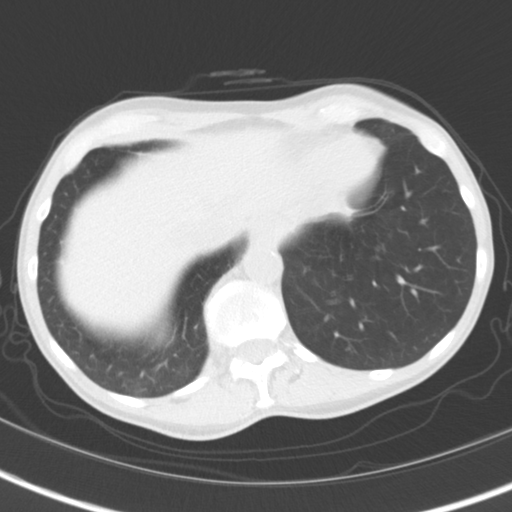
[im 21/63  mediastinal]
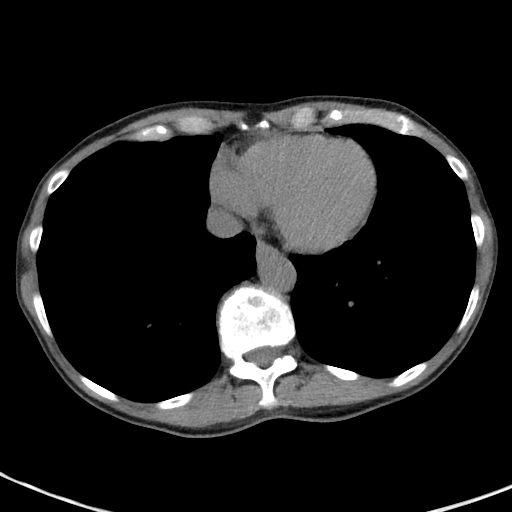
[im 21/63  lung]
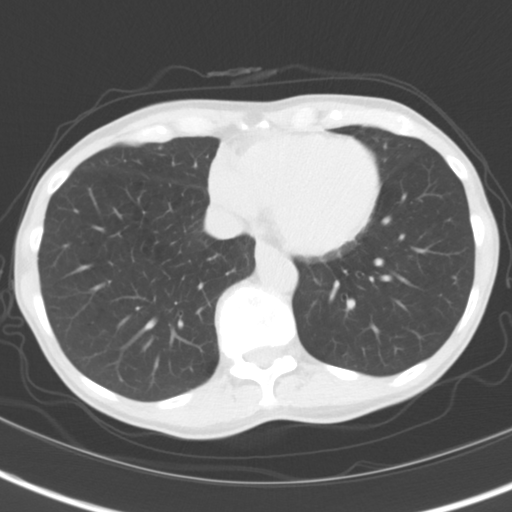
[im 24/63  lung]
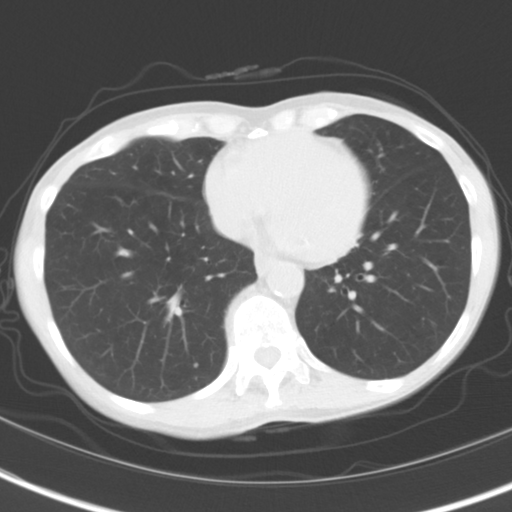
[im 29/63  lung]
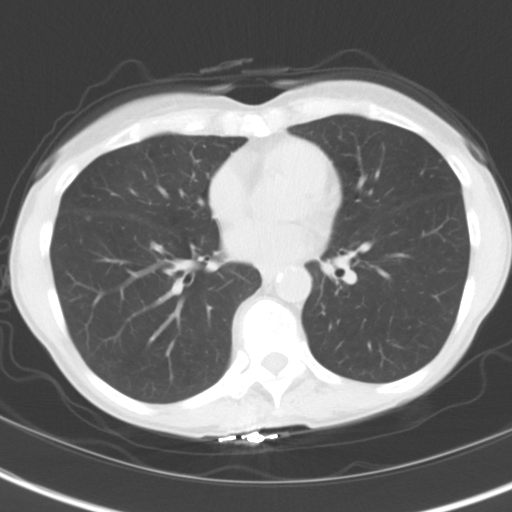
[im 32/63  lung]
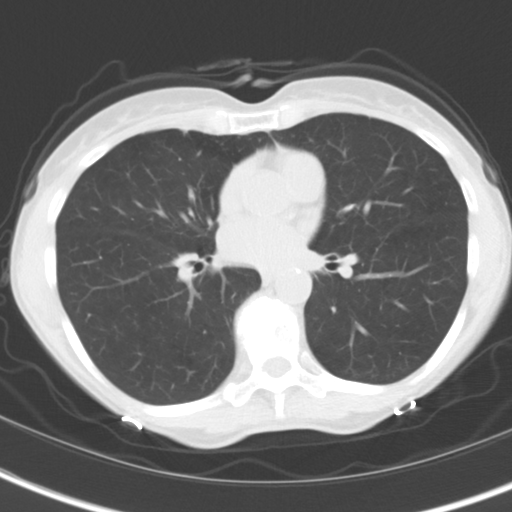
[im 34/63  mediastinal]
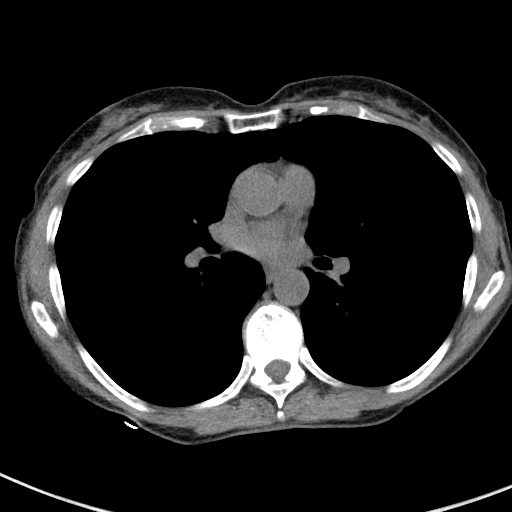
[im 34/63  lung]
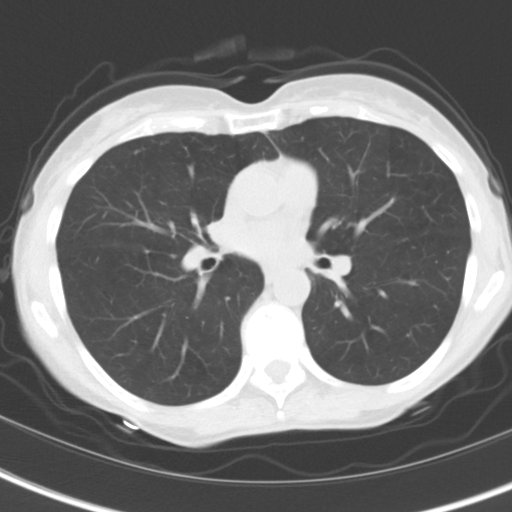
[im 39/63  lung]
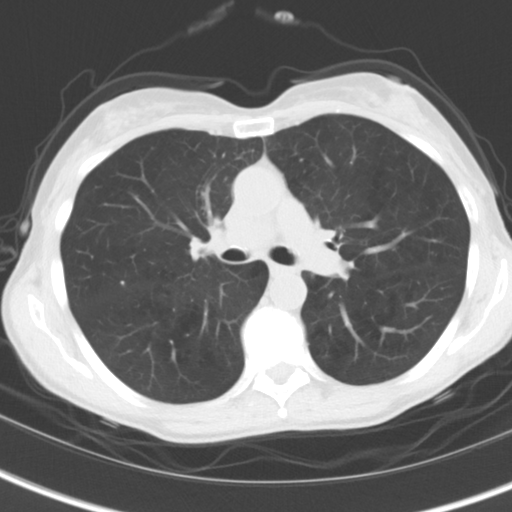
[im 42/63  lung]
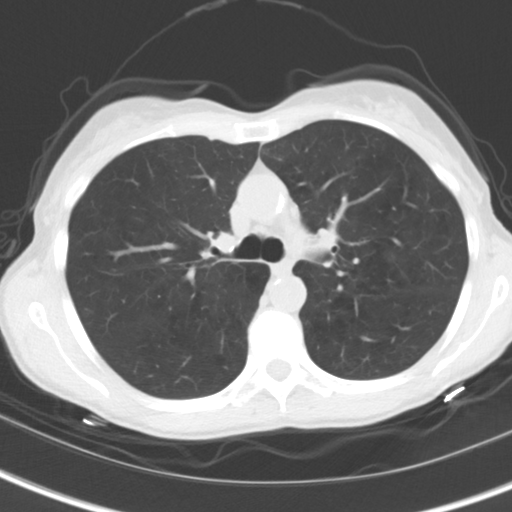
[im 47/63  lung]
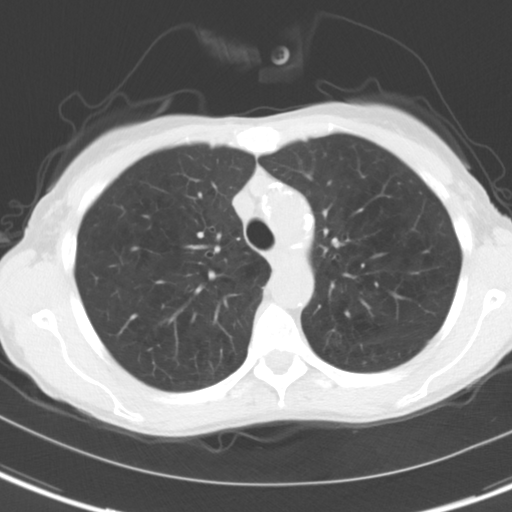
[im 52/63  mediastinal]
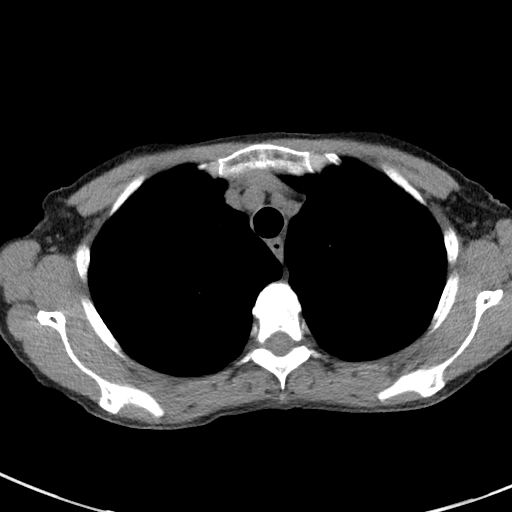
[im 52/63  lung]
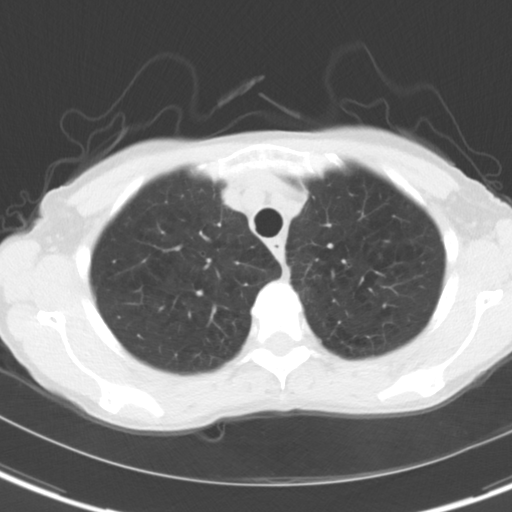
[im 55/63  lung]
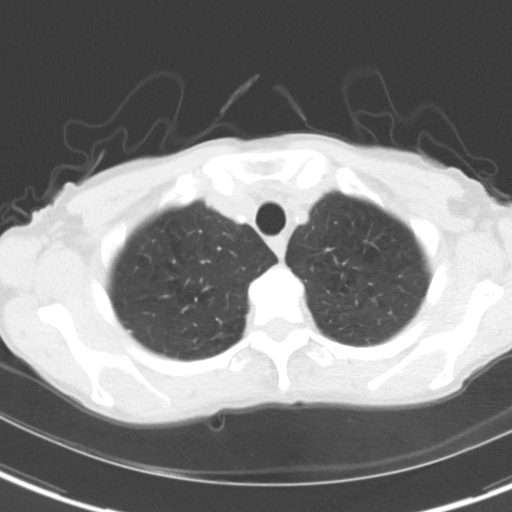
[im 60/63  lung]
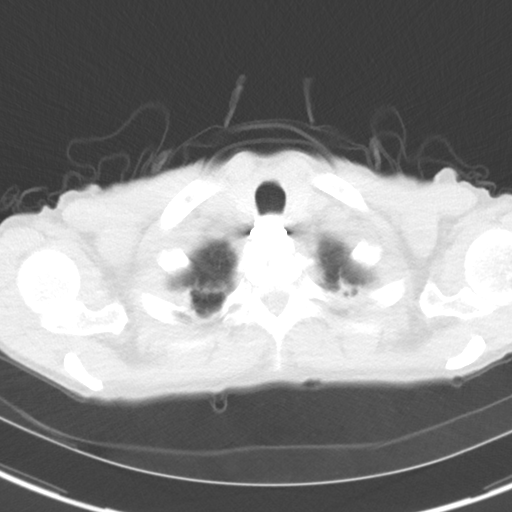

[15 of 40 positions shown; findings below may reference images not displayed]

FINDINGS: Cardiovascular: Atherosclerotic calcification of the aorta. Heart
size normal. No pericardial effusion.

Mediastinum/Nodes: No pathologically enlarged mediastinal or
axillary lymph nodes. Hilar regions are difficult to definitively
evaluate without IV contrast but appear grossly unremarkable.
Esophagus is grossly unremarkable.

Lungs/Pleura: Biapical pleuroparenchymal scarring. Centrilobular
emphysema. Perifissural lymph nodes. Calcified granulomas. 3.2 mm
right lower lobe nodule, unchanged. No new or worrisome pulmonary
nodules. No pleural fluid. Adherent debris in the trachea. Airway is
otherwise unremarkable.

Upper Abdomen: Visualized portions of the liver and adrenal glands
are unremarkable. Punctate stone in the left kidney. Visualized
portions of the spleen, pancreas, stomach and bowel are grossly
unremarkable.

Musculoskeletal: Degenerative changes in the spine. No worrisome
lytic or sclerotic lesions.
IMPRESSION: 1. Lung-RADS 2, benign appearance or behavior. Continue annual
screening with low-dose chest CT without contrast in 12 months.
2. Punctate left renal stone.
3.  Aortic atherosclerosis ([4R]-170.0).
4.  Emphysema ([4R]-[4R]).

## 2019-02-03 ENCOUNTER — Encounter: Payer: Self-pay | Admitting: *Deleted

## 2019-08-19 ENCOUNTER — Other Ambulatory Visit: Payer: Self-pay | Admitting: Nurse Practitioner

## 2019-08-19 DIAGNOSIS — G8929 Other chronic pain: Secondary | ICD-10-CM

## 2019-08-19 DIAGNOSIS — M542 Cervicalgia: Secondary | ICD-10-CM

## 2019-08-28 ENCOUNTER — Other Ambulatory Visit: Payer: Self-pay

## 2019-08-28 ENCOUNTER — Ambulatory Visit
Admission: RE | Admit: 2019-08-28 | Discharge: 2019-08-28 | Disposition: A | Discharge status: Home / Self Care | Payer: Managed Care, Other (non HMO) | Source: Ambulatory Visit | Attending: Nurse Practitioner | Admitting: Nurse Practitioner

## 2019-08-28 DIAGNOSIS — M5441 Lumbago with sciatica, right side: Secondary | ICD-10-CM | POA: Diagnosis present

## 2019-08-28 DIAGNOSIS — G8929 Other chronic pain: Secondary | ICD-10-CM | POA: Insufficient documentation

## 2019-08-28 IMAGING — MR MR LUMBAR SPINE WO/W CM
5 of 7 series · 29 of 48 positions shown · IV contrast (4ml Gadavist)
Comparison: None available.

CLINICAL DATA: Initial evaluation for chronic bilateral lower back
pain with right-sided sciatica.

EXAM:
MRI LUMBAR SPINE WITHOUT AND WITH CONTRAST
TECHNIQUE: Multiplanar and multiecho pulse sequences of the lumbar spine were
obtained without and with intravenous contrast.
CONTRAST:  4mL GADAVIST GADOBUTROL 1 MMOL/ML IV SOLN

[Series 5: T2 · sagittal · 4.0mm · 0.81mm/px · 4 of 17 slices shown (1 of 2)]
[im 1/17]
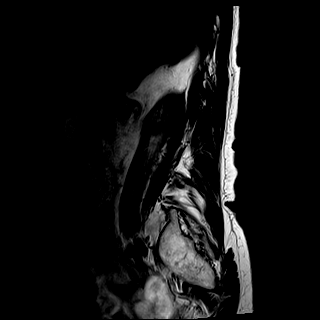
[im 6/17]
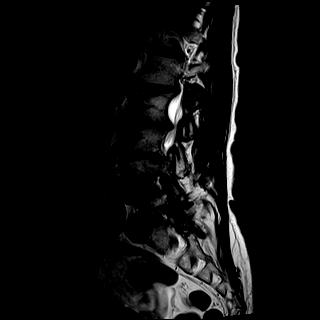
[im 11/17]
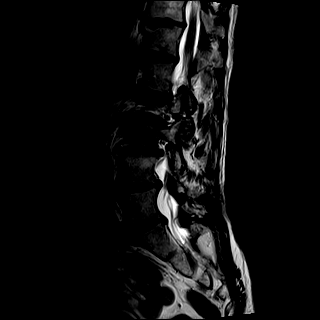
[im 17/17]
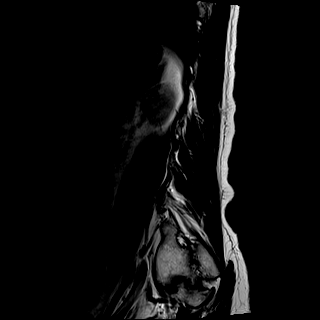

[Series 6: T1 · sagittal · 4.0mm · 0.81mm/px · 4 of 17 slices shown (1 of 2)]
[im 1/17]
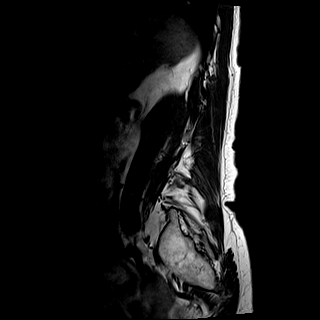
[im 6/17]
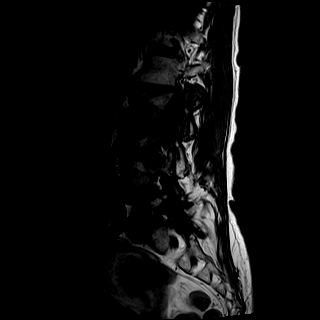
[im 11/17]
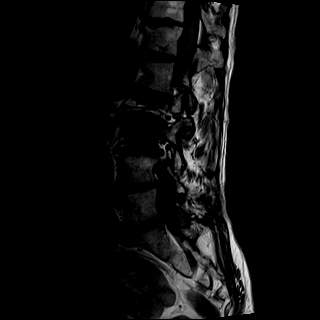
[im 17/17]
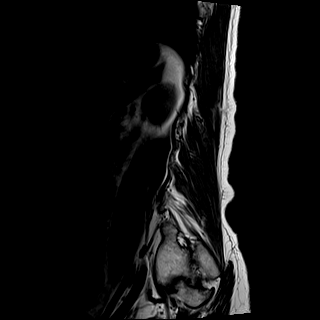

[Series 8: T2 · axial · 4.0mm · 0.78mm/px · z∈[-54,+155]mm · 8 of 36 slices shown (2 of 2)]
[im 1/36]
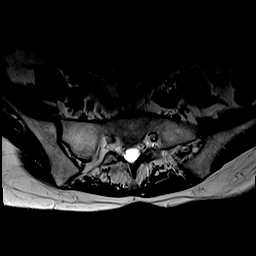
[im 4/36]
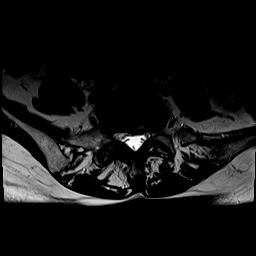
[im 12/36]
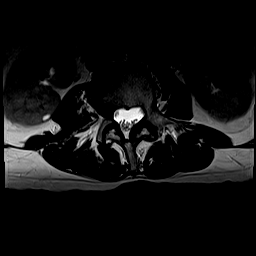
[im 16/36]
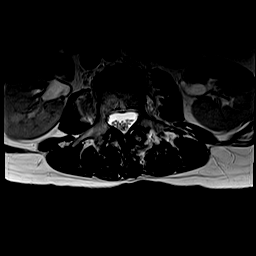
[im 20/36]
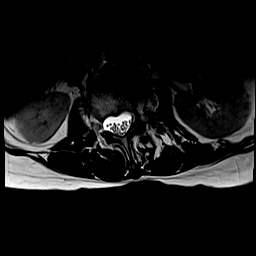
[im 24/36]
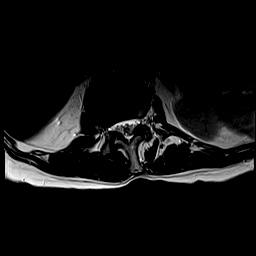
[im 32/36]
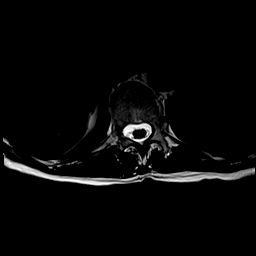
[im 36/36]
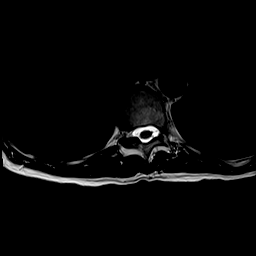

[Series 9: T1 · axial · 4.0mm · 0.39mm/px · z∈[-54,+155]mm · 8 of 36 slices shown (2 of 2)]
[im 1/36]
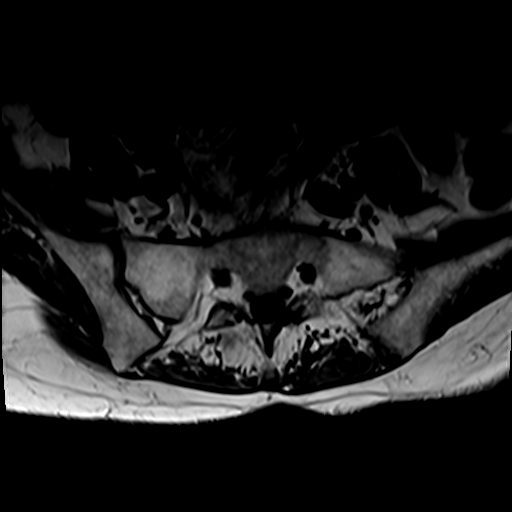
[im 4/36]
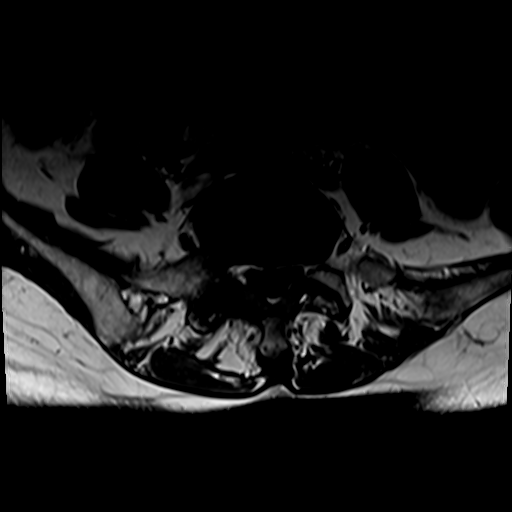
[im 12/36]
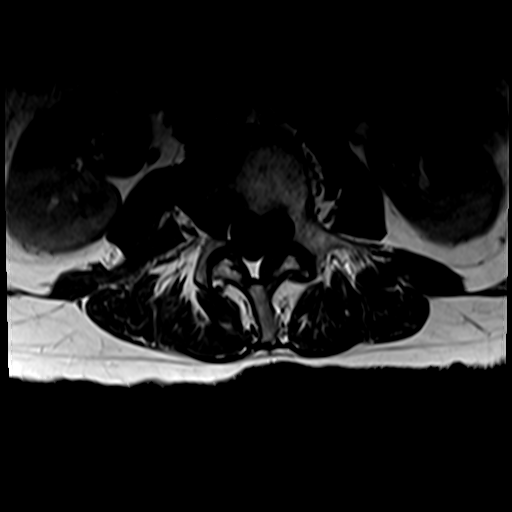
[im 16/36]
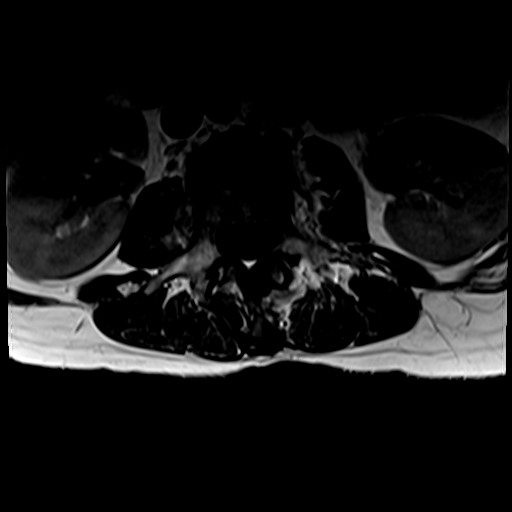
[im 20/36]
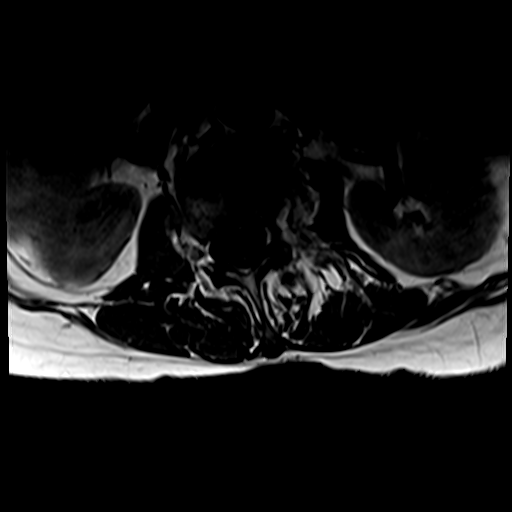
[im 24/36]
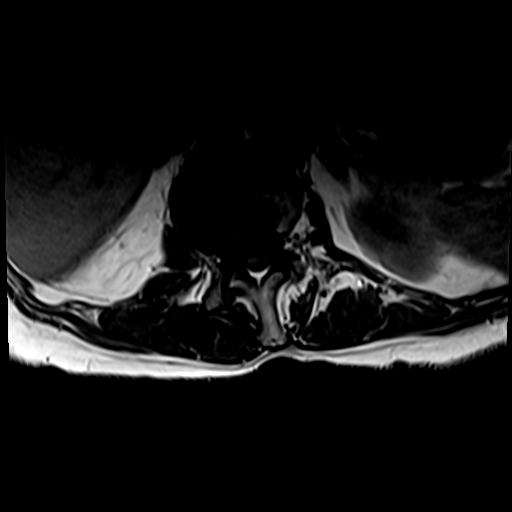
[im 32/36]
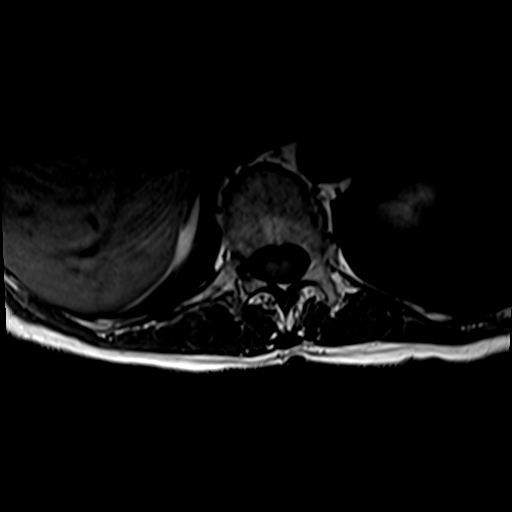
[im 36/36]
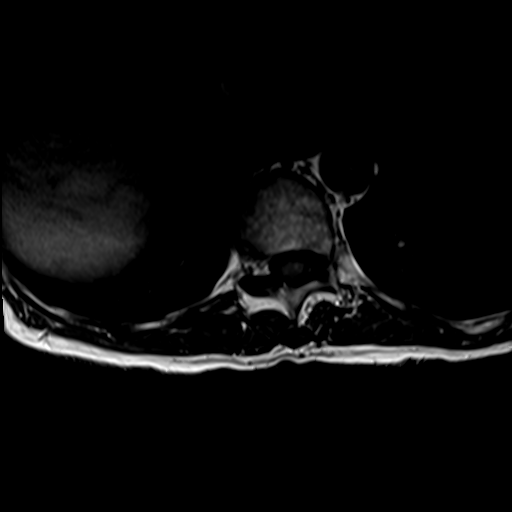

[Series 10: T1 fat-sat post-contrast · sagittal · 4.0mm · 0.81mm/px · 5 of 17 slices shown]
[im 1/17]
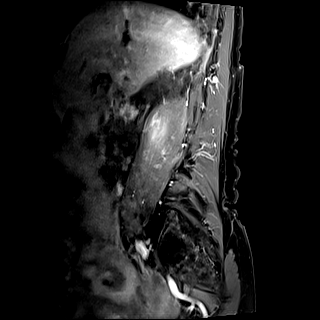
[im 5/17]
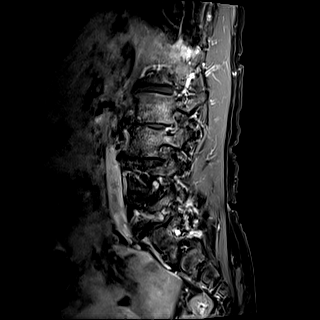
[im 9/17]
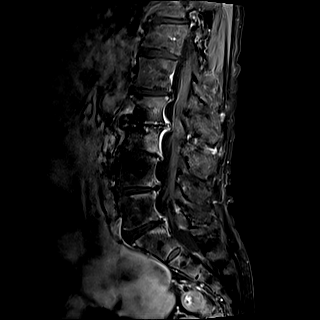
[im 13/17]
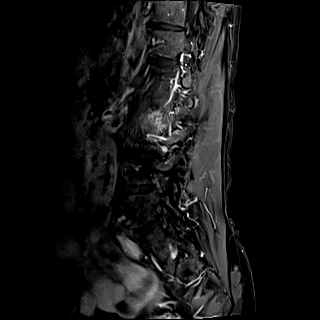
[im 17/17]
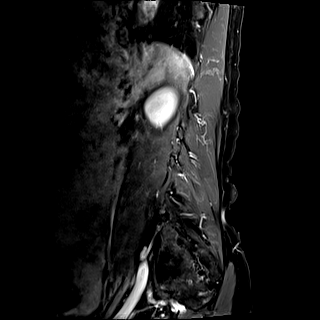

[29 of 48 positions shown; findings below may reference images not displayed]

FINDINGS: Segmentation: Standard. Lowest well-formed disc space labeled the
L5-S1 level.

Alignment: Moderate levoscoliosis with apex at L2-3. Trace
anterolisthesis of L4 on L5. Alignment otherwise normal preservation
of the normal lumbar lordosis.

Vertebrae: Vertebral body height maintained without evidence for
acute or chronic fracture. Bone marrow signal intensity within
normal limits. No worrisome osseous lesions. Advanced discogenic
reactive endplate changes with associated marrow edema and
enhancement present about the L2-3 interspace. More mild reactive
endplate changes noted at L3-4 through L5-S1 as well. No other
abnormal marrow edema.

Conus medullaris and cauda equina: Conus extends to the L1-2 level.
Conus and cauda equina appear normal.

Paraspinal and other soft tissues: Edema and enhancement seen within
the right greater than left psoas musculature adjacent to the L2-3
interspace, felt to be likely reactive in nature. Paraspinous soft
tissues demonstrate no other acute finding. Few scattered
subcentimeter cyst noted within the kidneys bilaterally. Tortuosity
the intra-abdominal aorta noted. Visualized visceral structures
otherwise unremarkable.

Disc levels:

L1-2: Diffuse disc bulge with disc desiccation and intervertebral
disc space narrowing. Disc bulging slightly asymmetric to the left.
Mild to moderate bilateral facet hypertrophy. Resultant mild left
lateral recess stenosis. Central canal remains patent. No foraminal
stenosis.

L2-3: Advanced intervertebral disc space narrowing with diffuse disc
bulge and disc desiccation. Associated severe reactive endplate
changes with associated marrow edema and enhancement. Superimposed
right extraforaminal disc protrusion extends into the adjacent right
psoas muscle (series 8, image 19). Associated edema and enhancement
within the adjacent psoas musculature. More mild soft tissue edema
noted within the contralateral left psoas muscle. Mild to moderate
facet and ligament flavum hypertrophy. Resultant mild spinal
stenosis. Mild left L2 foraminal narrowing. No significant right
foraminal encroachment.

L3-4: Chronic intervertebral disc space narrowing with diffuse disc
bulge and disc desiccation. Reactive endplate changes with marginal
endplate osteophytic spurring. Disc bulging slightly asymmetric to
the left. Moderate left with mild right facet hypertrophy. Resultant
mild canal with left lateral recess stenosis. Mild left L3 foraminal
narrowing. No significant right foraminal encroachment.

L4-5: Diffuse disc bulge with disc desiccation and intervertebral
disc space narrowing. Mild reactive endplate changes. Superimposed
tiny central disc extrusion with superior migration (series 5, image
10). Moderate facet and ligament flavum hypertrophy. Resultant mild
to moderate bilateral lateral recess stenosis, worse on the right.
Mild right L4 foraminal narrowing. No significant left foraminal
stenosis.

L5-S1: Diffuse disc bulge with disc desiccation and intervertebral
disc space narrowing. Mild reactive endplate changes. Severe left
with moderate right facet degeneration. Resultant mild narrowing of
the lateral recesses bilaterally. Moderate right with mild left L5
foraminal stenosis.
IMPRESSION: 1. Advanced degenerative disc disease at L2-3 with associated severe
reactive endplate changes, with superimposed large right
extraforaminal disc herniation. Associated reactive edema and
enhancement within the adjacent right greater than left psoas
musculature. Finding could contribute to back pain with right-sided
sciatica.
2. Disc bulge with facet hypertrophy at L4-5 with resultant mild to
moderate right greater than left lateral recess stenosis.
3. Moderate right L5 foraminal stenosis related to disc bulge,
reactive endplate changes, and facet degeneration.
4. Underlying moderate dextroscoliosis.

## 2019-08-28 MED ORDER — GADOBUTROL 1 MMOL/ML IV SOLN
4.0000 mL | Freq: Once | INTRAVENOUS | Status: AC | PRN
  Administered 2019-08-28: 4 mL via INTRAVENOUS

## 2019-09-04 ENCOUNTER — Ambulatory Visit: Payer: Medicare Other

## 2019-10-06 ENCOUNTER — Other Ambulatory Visit: Payer: Self-pay | Admitting: Physical Medicine & Rehabilitation

## 2019-10-06 DIAGNOSIS — M5412 Radiculopathy, cervical region: Secondary | ICD-10-CM

## 2019-10-23 ENCOUNTER — Ambulatory Visit: Payer: Managed Care, Other (non HMO)

## 2019-11-07 ENCOUNTER — Other Ambulatory Visit: Payer: Self-pay

## 2019-11-07 ENCOUNTER — Ambulatory Visit
Admission: RE | Admit: 2019-11-07 | Discharge: 2019-11-07 | Disposition: A | Discharge status: Home / Self Care | Payer: Managed Care, Other (non HMO) | Source: Ambulatory Visit | Attending: Physical Medicine & Rehabilitation | Admitting: Physical Medicine & Rehabilitation

## 2019-11-07 DIAGNOSIS — M5412 Radiculopathy, cervical region: Secondary | ICD-10-CM | POA: Diagnosis present

## 2019-11-07 IMAGING — MR MR CERVICAL SPINE WO/W CM
5 of 8 series · 27 of 48 positions shown · IV contrast (gadavist)
Comparison: Chest CT [DATE]

CLINICAL DATA: Cervical surgery more than 20 years ago. Progressive
worsening neck pain right worse than left. Shoulder and arm pain.
Occasional tingling of the fingers.

EXAM:
MRI CERVICAL SPINE WITHOUT AND WITH CONTRAST
TECHNIQUE: Multiplanar and multiecho pulse sequences of the cervical spine, to
include the craniocervical junction and cervicothoracic junction,
were obtained without and with intravenous contrast.
CONTRAST:  4mL GADAVIST GADOBUTROL 1 MMOL/ML IV SOLN

[Series 5: T2 · sagittal · 3.0mm · 0.62mm/px · 4 of 15 slices shown (1 of 2)]
[im 1/15]
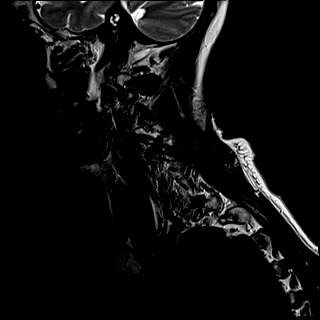
[im 5/15]
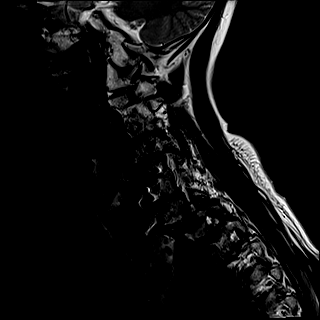
[im 10/15]
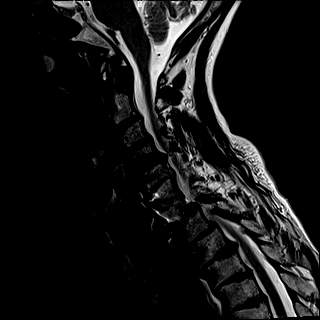
[im 15/15]
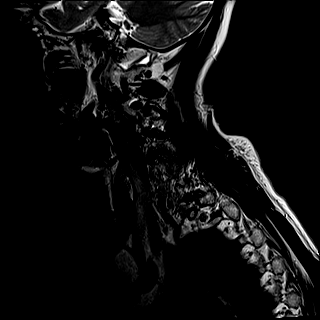

[Series 7: STIR · sagittal · 3.0mm · 0.62mm/px · 4 of 15 slices shown]
[im 1/15]
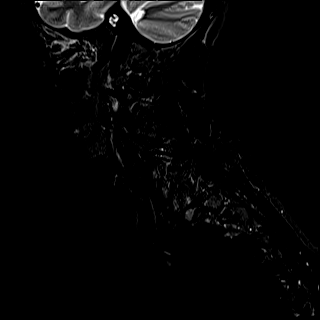
[im 5/15]
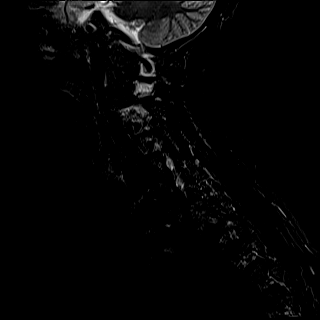
[im 10/15]
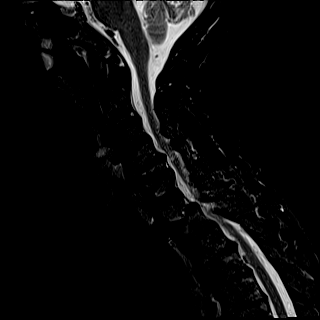
[im 15/15]
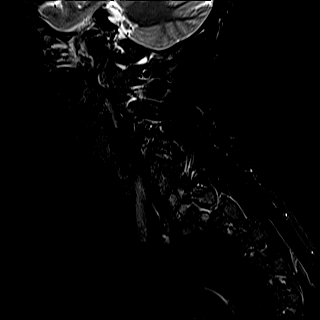

[Series 8: T2 · axial · 3.0mm · 0.70mm/px · z∈[-130,-27]mm · 8 of 33 slices shown (2 of 2)]
[im 1/33]
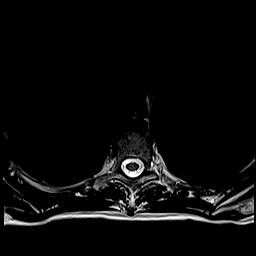
[im 5/33]
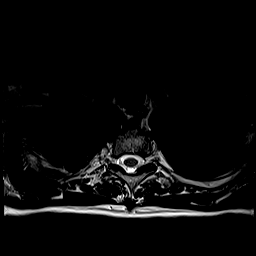
[im 10/33]
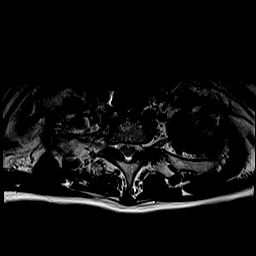
[im 14/33]
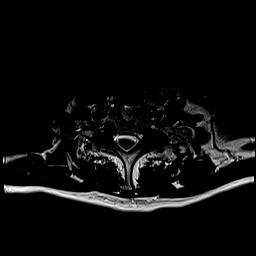
[im 19/33]
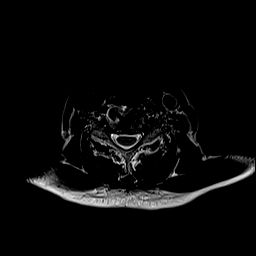
[im 23/33]
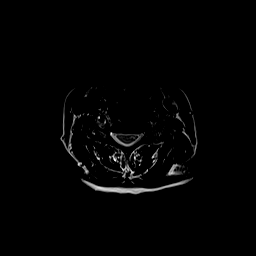
[im 28/33]
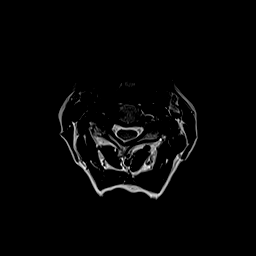
[im 33/33]
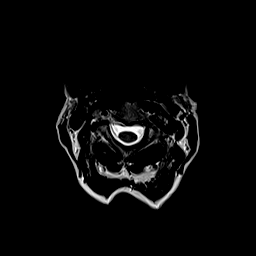

[Series 10: T1 · axial · non-contrast · 3.0mm · 0.35mm/px · z∈[-130,-27]mm · 8 of 33 slices shown]
[im 1/33]
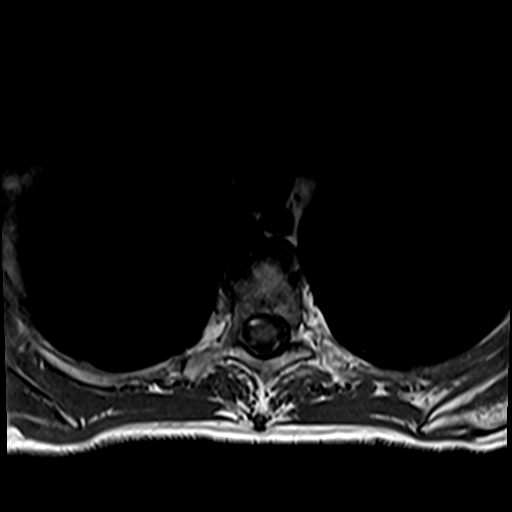
[im 5/33]
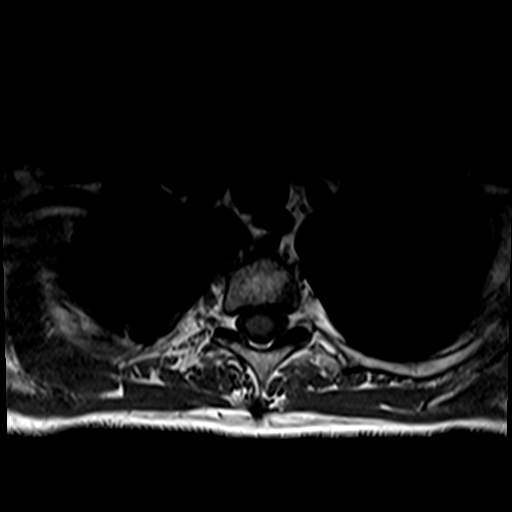
[im 10/33]
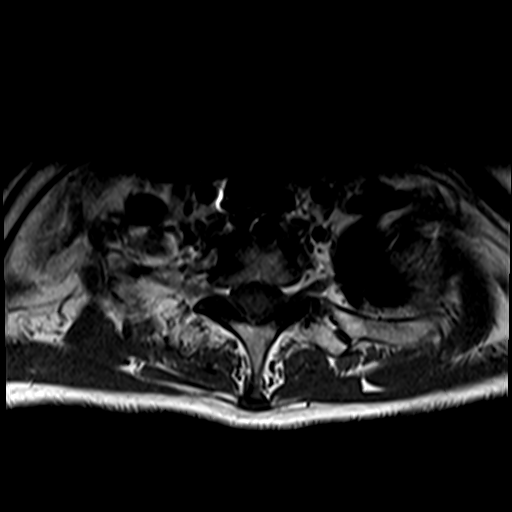
[im 14/33]
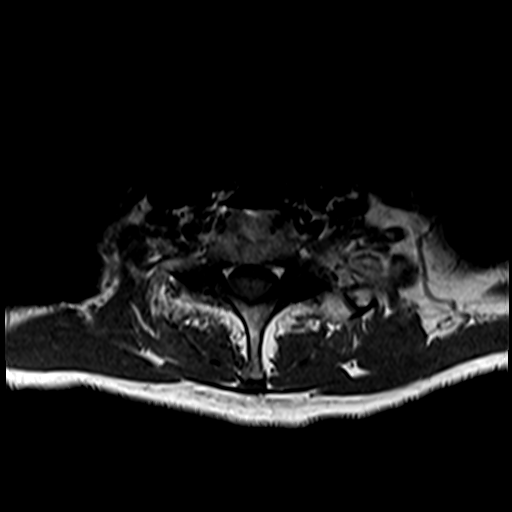
[im 19/33]
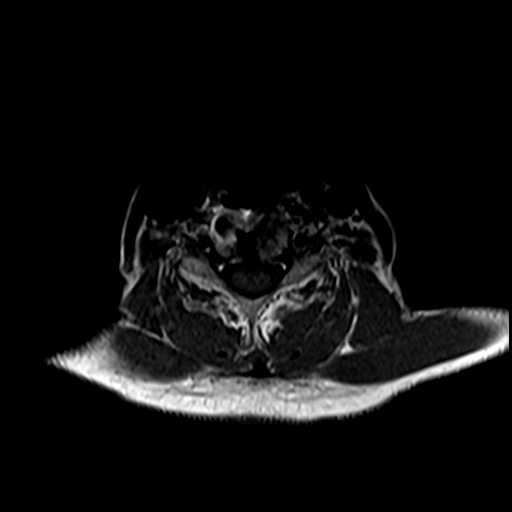
[im 23/33]
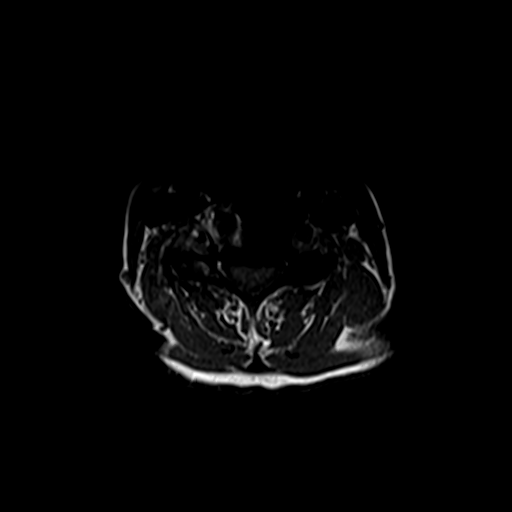
[im 28/33]
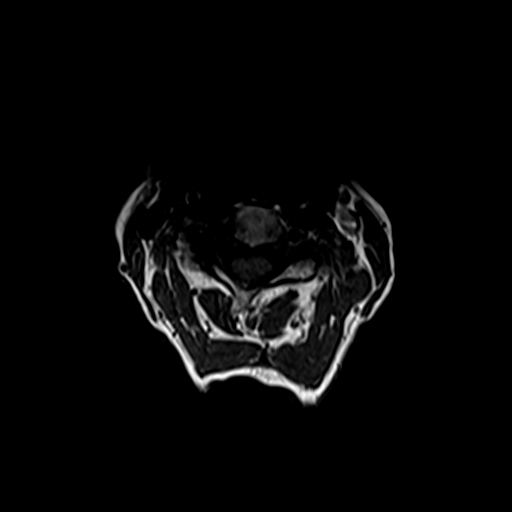
[im 33/33]
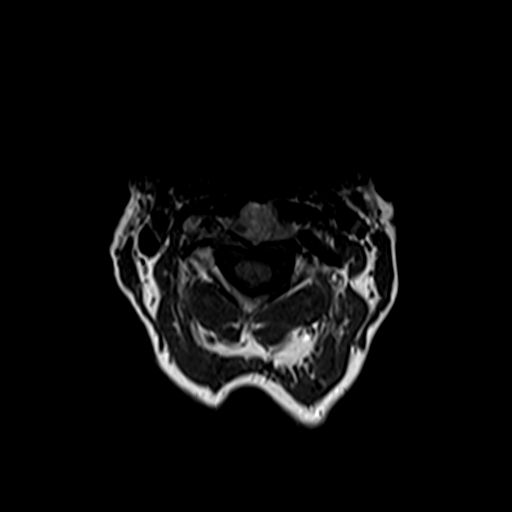

[Series 12: T1 post-contrast · axial · 3.0mm · 0.35mm/px · z∈[-130,-104]mm · 3 of 31 slices shown]
[im 1/31]
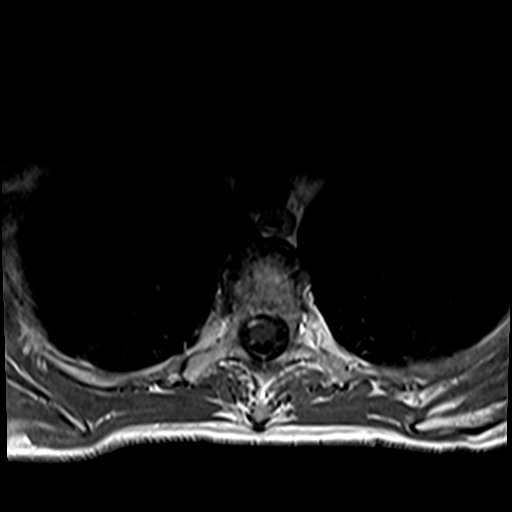
[im 5/31]
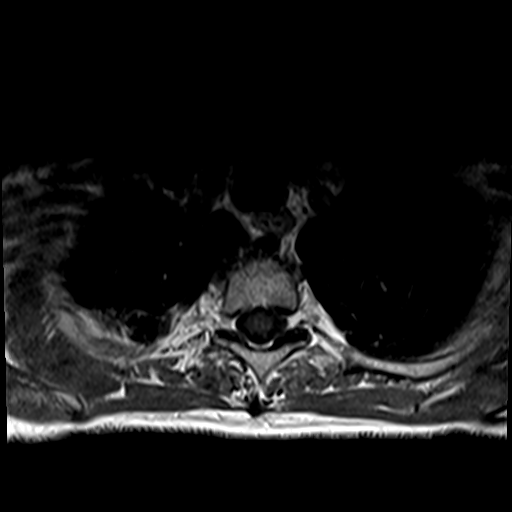
[im 9/31]
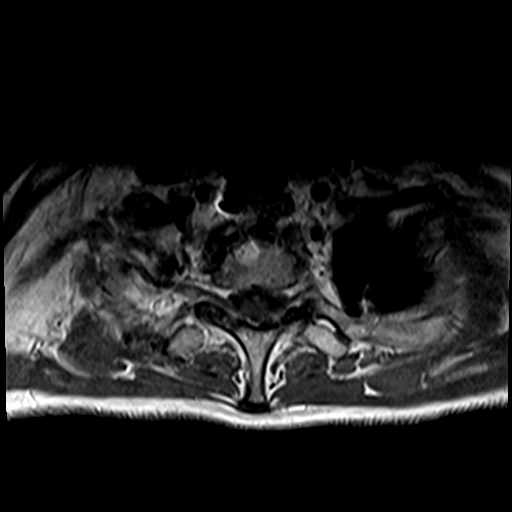

[27 of 48 positions shown; findings below may reference images not displayed]

FINDINGS: Alignment: Straightening of the normal cervical lordosis from the
skull base through C7. 4 mm anterolisthesis at both C7-T1 and T1-T2.

Vertebrae: Previous ACDF C5 to the superior endplate of T1.

Cord: Question early abnormal T2 signal within the ventral cord at
the C4-5 level. See below regarding stenosis.

Posterior Fossa, vertebral arteries, paraspinal tissues: Negative

Disc levels:

Foramen magnum widely patent. C1-2 shows ordinary osteoarthritis but
without encroachment upon the neural structures.

C2-3: Chronic facet osteoarthritis left more than right. No canal
stenosis. Mild left foraminal narrowing.

C3-4: Mild disc bulge. Mild facet degeneration and hypertrophy. No
compressive canal stenosis. Mild bilateral foraminal narrowing.

C4-5: Degenerative spondylosis with endplate osteophytes and bulging
of the disc. Narrowing of the canal with AP diameter measuring
mm. Question early abnormal T2 signal within the ventral cord.
Bilateral foraminal stenosis could affect either C5 nerve.

C5 through C7: Previous ACDF appears solid with sufficient patency
of the canal and foramina.

C7-T1: 4 mm of anterolisthesis. Endplate osteophytes and bulging of
the disc. Canal narrowing with AP diameter of 7.4 mm. No cord
compression or abnormal cord signal. Bilateral foraminal stenosis
could compress either or both C8 nerves. Based on the CT scan, it
looks like the inferior screws at the ACDF do enter the anterior
superior corner of the T1 vertebral body.

T1-2: Bilateral facet arthropathy with 4 mm of anterolisthesis.
Bulging of the disc. AP diameter of the canal 7.2 mm. No cord
compression. Mild bilateral foraminal stenosis.

T2-3, T3-4 and T4-5: Disc degeneration with endplate osteophytes and
disc bulges but no apparent compressive stenosis.
IMPRESSION: 1. Previous ACDF C5 through C7 appears solid with sufficient patency
of the canal and foramina.
2. Adjacent segment degenerative spondylosis at C4-5. Spinal
stenosis with AP diameter 7.3 mm. Question early abnormal T2 signal
within the ventral cord. Bilateral foraminal stenosis could affect
either C5 nerve.
3. C7-T1: Bilateral foraminal stenosis could compress either or both
C8 nerves. This is due to osteophytic encroachment and facet
arthropathy. 4 mm of anterolisthesis. Canal narrowing with AP
diameter of 7.4 mm. No cord compression. Based on the CT scan, it
looks like the inferior screws at the ACDF enter the anterior
superior corner of the T1 vertebral body.
4. T1-2: Bilateral facet arthropathy with 4 mm of anterolisthesis.
Bulging of the disc. Mild bilateral foraminal stenosis.
5. C3-4: Mild bilateral foraminal narrowing.
6. C2-3: Chronic facet osteoarthritis left more than right. Mild
left foraminal narrowing.
7. T2-3, T3-4 and T4-5: Spondylosis without apparent compressive
stenosis.

## 2019-11-07 MED ORDER — GADOBUTROL 1 MMOL/ML IV SOLN
4.0000 mL | Freq: Once | INTRAVENOUS | Status: AC | PRN
  Administered 2019-11-07: 4 mL via INTRAVENOUS

## 2019-11-14 ENCOUNTER — Other Ambulatory Visit: Payer: Self-pay | Admitting: Physical Medicine & Rehabilitation

## 2019-11-14 DIAGNOSIS — M542 Cervicalgia: Secondary | ICD-10-CM

## 2019-11-21 ENCOUNTER — Ambulatory Visit
Admission: RE | Admit: 2019-11-21 | Discharge: 2019-11-21 | Disposition: A | Discharge status: Home / Self Care | Payer: Managed Care, Other (non HMO) | Source: Ambulatory Visit | Attending: Physical Medicine & Rehabilitation | Admitting: Physical Medicine & Rehabilitation

## 2019-11-21 ENCOUNTER — Other Ambulatory Visit: Payer: Self-pay

## 2019-11-21 DIAGNOSIS — M542 Cervicalgia: Secondary | ICD-10-CM

## 2019-11-21 IMAGING — XA DG INJECT/[PERSON_NAME] INC NEEDLE/CATH/PLC EPI/CERV/THOR W/IMG
2 series · 2 of 2 positions shown · non-contrast
Comparison: none

CLINICAL DATA: Cervical spondylosis without myelopathy. Neck pain
greater on the right. Headaches.

[Series 1: ortho standard · 1 of 1 slices shown (1 of 2)]
[im 1/1]
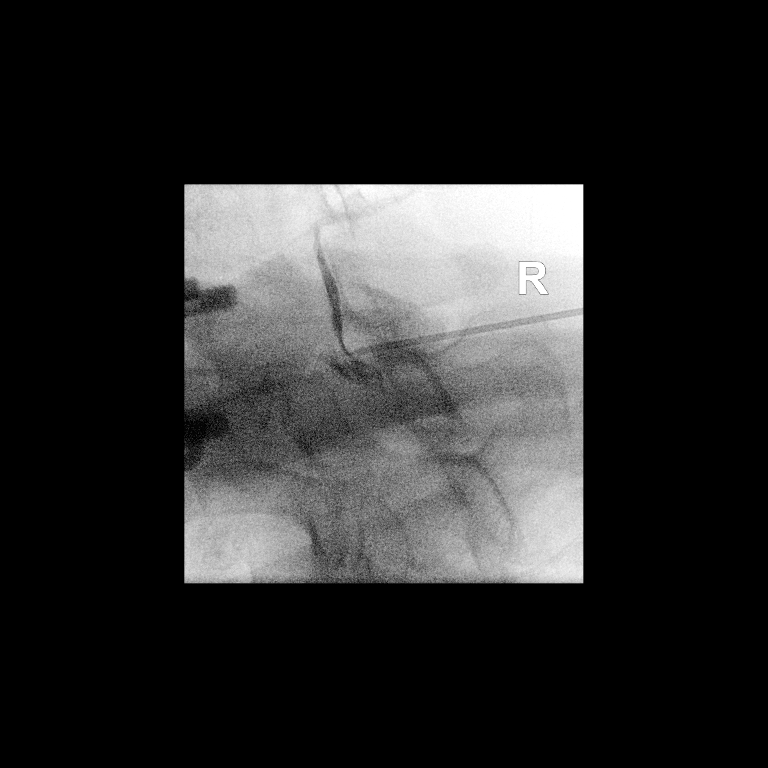

[Series 3: ortho standard · 1 of 1 slices shown (2 of 2)]
[im 1/1]
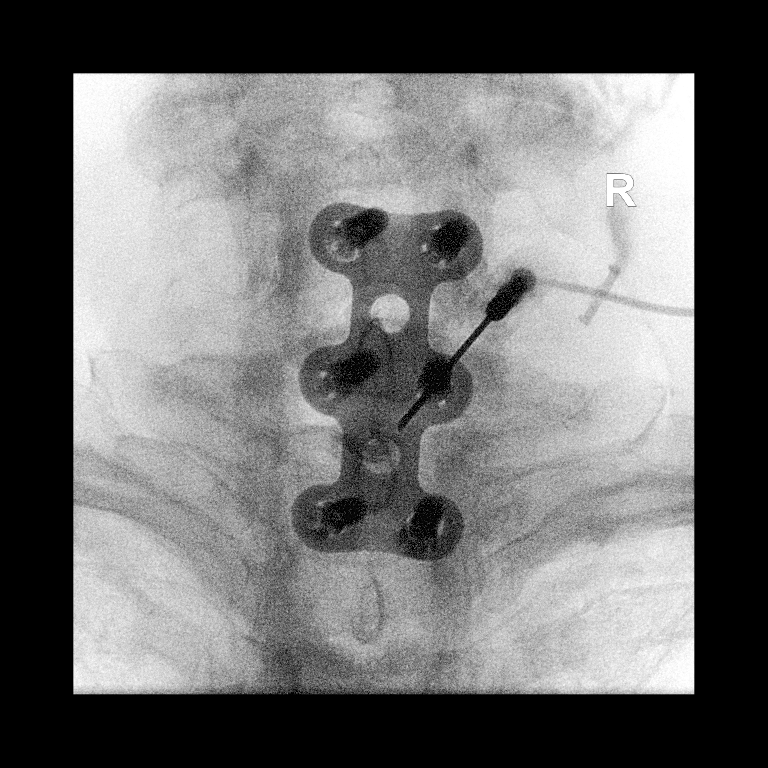

[2 of 2 positions shown; findings below may reference images not displayed]

FLUOROSCOPY TIME:  Fluoroscopy Time: 39 seconds

Radiation Exposure Index: 8.26 microGray*m^2

PROCEDURE:
The procedure, risks, benefits, and alternatives were explained to
the patient. Questions regarding the procedure were encouraged and
answered. The patient understands and consents to the procedure.

CERVICAL EPIDURAL INJECTION

An interlaminar approach was performed on the right at C7-T1. A
inch 20 gauge epidural needle was advanced using loss-of-resistance
technique.

DIAGNOSTIC EPIDURAL INJECTION

Injection of Isovue-M 300 shows a good epidural pattern with spread
above and below the level of needle placement, bilaterally but
greater on the right. No vascular opacification is seen.

THERAPEUTIC EPIDURAL INJECTION

1.5 ml of Kenalog 40 mixed with 2 ml of normal saline were then
instilled. The procedure was well-tolerated, and the patient was
discharged thirty minutes following the injection in good condition.
IMPRESSION: Technically successful interlaminar epidural injection on the right
at C7-T1.

## 2019-11-21 MED ORDER — IOPAMIDOL (ISOVUE-M 300) INJECTION 61%
1.0000 mL | Freq: Once | INTRAMUSCULAR | Status: AC
  Administered 2019-11-21: 1 mL via EPIDURAL

## 2019-11-21 MED ORDER — TRIAMCINOLONE ACETONIDE 40 MG/ML IJ SUSP (RADIOLOGY)
60.0000 mg | Freq: Once | INTRAMUSCULAR | Status: AC
  Administered 2019-11-21: 60 mg via EPIDURAL

## 2019-11-21 NOTE — Discharge Instructions (Signed)

## 2020-01-04 ENCOUNTER — Other Ambulatory Visit: Payer: Self-pay | Admitting: Family Medicine

## 2020-01-04 DIAGNOSIS — Z1231 Encounter for screening mammogram for malignant neoplasm of breast: Secondary | ICD-10-CM

## 2020-01-23 ENCOUNTER — Encounter: Payer: Self-pay | Admitting: *Deleted

## 2020-01-25 ENCOUNTER — Telehealth: Payer: Self-pay | Admitting: *Deleted

## 2020-01-25 ENCOUNTER — Encounter: Payer: Self-pay | Admitting: *Deleted

## 2020-01-25 NOTE — Telephone Encounter (Signed)
Attempted to contact and schedule lung screening scan. Message left for patient to call back to schedule. 

## 2020-01-30 ENCOUNTER — Ambulatory Visit (INDEPENDENT_AMBULATORY_CARE_PROVIDER_SITE_OTHER): Payer: Managed Care, Other (non HMO) | Admitting: Cardiovascular Disease

## 2020-01-30 ENCOUNTER — Encounter: Payer: Self-pay | Admitting: Cardiovascular Disease

## 2020-01-30 ENCOUNTER — Other Ambulatory Visit: Payer: Self-pay

## 2020-01-30 ENCOUNTER — Ambulatory Visit (INDEPENDENT_AMBULATORY_CARE_PROVIDER_SITE_OTHER): Payer: Managed Care, Other (non HMO)

## 2020-01-30 VITALS — BP 142/68 | HR 72 | Ht 62.5 in | Wt 99.0 lb

## 2020-01-30 DIAGNOSIS — I479 Paroxysmal tachycardia, unspecified: Secondary | ICD-10-CM

## 2020-01-30 DIAGNOSIS — J432 Centrilobular emphysema: Secondary | ICD-10-CM

## 2020-01-30 DIAGNOSIS — I700 Atherosclerosis of aorta: Secondary | ICD-10-CM | POA: Diagnosis not present

## 2020-01-30 DIAGNOSIS — R0789 Other chest pain: Secondary | ICD-10-CM

## 2020-01-30 DIAGNOSIS — F17200 Nicotine dependence, unspecified, uncomplicated: Secondary | ICD-10-CM

## 2020-01-30 NOTE — Progress Notes (Signed)
Cardiology Office Note  Date:  01/30/2020   ID:  Carrie Meyers, DOB 1950-04-19, MRN [id number]  PCP:  Shane Crutch, PA   Chief Complaint  Patient presents with  . New Patient (Initial Visit)    Referred by PCP for afib. Meds reviewed verbally with patient.     HPI:  Carrie Meyers is a 69 year old woman with past medical history of Smoker Aortic atherosclerosis on CT scan Emphysema on CT scan Who presents by referral from Shane Crutch for cardiac arrhythmia, possible atrial fibrillation  3 weeks ago, 12/2019 Using cardiomobile appreciated irreguilar rhythm , Funny feeling across chest Lasted <30 min, Had multiple Other episodes going back years Previously told it was panic attacks  Tolerating Crestor, lisinopril Requested patient lab work  CT chest:01/2019 just pulled up and reviewed .  Aortic atherosclerosis (ICD10-170.0). 4.  Emphysema (ICD10-J43.9).  Denies any significant chest pain on exertion, She does have a cough, chronic  PMH:   has a past medical history of A-fib (HCC), Abdominal bloating, Atherosclerosis of native coronary artery, Depression, Fatigue, Generalized anxiety disorder, Hip pain, bilateral, History of emphysema, Hyperlipidemia, Hypertension, Panic attacks, Pruritus of scalp, Right-sided back pain, and Skin lesion.  PSH:    Past Surgical History:  Procedure Laterality Date  . APPENDECTOMY    . BREAST BIOPSY Left 2000   negative  . BREAST EXCISIONAL BIOPSY Left 2000   negative  . c section    . NECK SURGERY      Current Outpatient Medications  Medication Sig Dispense Refill  . cetirizine (ZYRTEC) 10 MG tablet Take 10 mg by mouth daily.    Marland Kitchen lisinopril (ZESTRIL) 20 MG tablet Take 20 mg by mouth daily.    Marland Kitchen LORazepam (ATIVAN) 0.5 MG tablet Take 1 tablet by mouth daily as needed.    . pantoprazole (PROTONIX) 20 MG tablet Take 20 mg by mouth daily.    . rosuvastatin (CRESTOR) 10 MG tablet Take 10 mg by mouth daily.     No current  facility-administered medications for this visit.     Allergies:   Patient has no allergy information on record.   Social History:  The patient  reports that she has been smoking cigarettes. She has a 33.75 pack-year smoking history. She has never used smokeless tobacco. She reports that she does not drink alcohol and does not use drugs.   Family History:   family history is not on file.    Review of Systems: Review of Systems  Constitutional: Negative.   HENT: Negative.   Respiratory: Negative.   Cardiovascular: Negative.   Gastrointestinal: Negative.   Musculoskeletal: Negative.   Neurological: Negative.   Psychiatric/Behavioral: Negative.   All other systems reviewed and are negative.   PHYSICAL EXAM: VS:  BP (!) 142/68 (BP Location: Right Arm, Patient Position: Sitting, Cuff Size: Normal)   Pulse 72   Ht 5' 2.5" (1.588 m)   Wt 99 lb (44.9 kg)   SpO2 98%   BMI 17.82 kg/m  , BMI Body mass index is 17.82 kg/m. GEN: Well nourished, well developed, in no acute distress HEENT: normal Neck: no JVD, 1+ carotid bruits L>R Cardiac: RRR; no murmurs, rubs, or gallops,no edema  Respiratory:  clear to auscultation bilaterally, normal work of breathing GI: soft, nontender, nondistended, + BS MS: no deformity or atrophy Skin: warm and dry, no rash Neuro:  Strength and sensation are intact Psych: euthymic mood, full affect  Recent Labs: No results found for requested labs within last 8760 hours.  Lipid Panel No results found for: CHOL, HDL, LDLCALC, TRIG    Wt Readings from Last 3 Encounters:  01/30/20 99 lb (44.9 kg)  09/17/17 90 lb (40.8 kg)       ASSESSMENT AND PLAN:  Problem List Items Addressed This Visit    None    Visit Diagnoses    Paroxysmal tachycardia (HCC)    -  Primary   Relevant Orders   EKG 12-Lead   LONG TERM MONITOR (3-14 DAYS)   Chest tightness       Relevant Orders   LONG TERM MONITOR (3-14 DAYS)   Smoker       Relevant Orders   LONG  TERM MONITOR (3-14 DAYS)   Centrilobular emphysema (HCC)       Relevant Orders   LONG TERM MONITOR (3-14 DAYS)   Aortic atherosclerosis (HCC)       Relevant Orders   EKG 12-Lead   LONG TERM MONITOR (3-14 DAYS)     Paroxysmal tachycardia, She is concerned about a mobile showing irregular rhythm consistent with atrial fibrillation Discussed atrial fibrillation in detail, identification of rhythm, monitoring, medications, risk and benefit of anticoagulation -We will start with a 2-week ZIO monitor to identify rhythm May need repeat monitor if not identified We will call her with the results  Aortic atherosclerosis Mild to moderate in the arch Minimal in the carotid arteries and minimal if any coronary calcification Recommend smoking cessation She is on Crestor, lipids have been requested from primary care  Smoker We have encouraged her to continue to work on weaning her cigarettes and smoking cessation. 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 [facility] [place], [place] [phone]

## 2020-01-30 NOTE — Patient Instructions (Addendum)
Medication Instructions:  No changes  If you need a refill on your cardiac medications before your next appointment, please call your pharmacy.    Lab work: No new labs needed   If you have labs (blood work) drawn today and your tests are completely normal, you will receive your results only by: Marland Kitchen MyChart Message (if you have MyChart) OR . A paper copy in the mail If you have any lab test that is abnormal or we need to change your treatment, we will call you to review the results.   Testing/Procedures: Zio monitor for irregular rhythm possible atrial fibrillation on cardiomobile  Your physician has recommended that you wear a Zio monitor. This monitor is a medical device that records the heart's electrical activity. Doctors most often use these monitors to diagnose arrhythmias. Arrhythmias are problems with the speed or rhythm of the heartbeat. The monitor is a small device applied to your chest. You can wear one while you do your normal daily activities. While wearing this monitor if you have any symptoms to push the button and record what you felt. Once you have worn this monitor for the period of time provider prescribed (Usually 14 days), you will return the monitor device in the postage paid box. Once it is returned they will download the data collected and provide [place] with a report which the provider will then review and we will call you with those results. Important tips:  1. Avoid showering during the first 24 hours of wearing the monitor. 2. Avoid excessive sweating to help maximize wear time. 3. Do not submerge the device, no hot tubs, and no swimming pools. 4. Keep any lotions or oils away from the patch. 5. After 24 hours you may shower with the patch on. Take brief showers with your back facing the shower head.  6. Do not remove patch once it has been placed because that will interrupt data and decrease adhesive wear time. 7. Push the button when you have any symptoms and write  down what you were feeling. 8. Once you have completed wearing your monitor, remove and place into box which has postage paid and place in your outgoing mailbox.  9. If for some reason you have misplaced your box then call our office and we can provide another box and/or mail it off for you.         Follow-Up: At [facility], you and your health needs are our priority.  As part of our continuing mission to provide you with exceptional heart care, we have created designated Provider Care Teams.  These Care Teams include your primary Cardiologist (physician) and Advanced Practice Providers (APPs -  Physician Assistants and Nurse Practitioners) who all work together to provide you with the care you need, when you need it.  . You will need a follow up appointment in:  We will call with the results  . Providers on your designated Care Team:   . Nicolasa Ducking, NP . Eula Listen, PA-C . Marisue Ivan, PA-C  Any Other Special Instructions Will Be Listed Below (If Applicable).  COVID-19 Vaccine Information can be found at: [email or web address] For questions related to vaccine distribution or appointments, please email [email or web address] or call [phone].

## 2020-02-01 ENCOUNTER — Ambulatory Visit: Payer: Managed Care, Other (non HMO)

## 2020-02-28 ENCOUNTER — Other Ambulatory Visit: Payer: Self-pay | Admitting: *Deleted

## 2020-02-28 DIAGNOSIS — Z122 Encounter for screening for malignant neoplasm of respiratory organs: Secondary | ICD-10-CM

## 2020-02-28 DIAGNOSIS — Z87891 Personal history of nicotine dependence: Secondary | ICD-10-CM

## 2020-02-28 NOTE — Progress Notes (Signed)
Contacted and will be scheduled for lung screening scan in [place]. Patient is a current smoker with a 34.75 pack year history.

## 2020-03-01 ENCOUNTER — Other Ambulatory Visit: Payer: Self-pay

## 2020-03-01 ENCOUNTER — Ambulatory Visit
Admission: RE | Admit: 2020-03-01 | Discharge: 2020-03-01 | Disposition: A | Discharge status: Home / Self Care | Payer: Managed Care, Other (non HMO) | Source: Ambulatory Visit | Attending: Family Medicine | Admitting: Family Medicine

## 2020-03-01 DIAGNOSIS — Z1231 Encounter for screening mammogram for malignant neoplasm of breast: Secondary | ICD-10-CM | POA: Diagnosis present

## 2020-03-01 LAB — HM MAMMOGRAPHY: Mammography, External: NEGATIVE

## 2020-03-01 IMAGING — MG DIGITAL SCREENING BILAT W/ TOMO W/ CAD
8 series · 9 of 24 positions shown · non-contrast
Comparison: Previous exam(s).

CLINICAL DATA: Screening.

EXAM:
DIGITAL SCREENING BILATERAL MAMMOGRAM WITH TOMO AND CAD

[L MLO synth-2D]
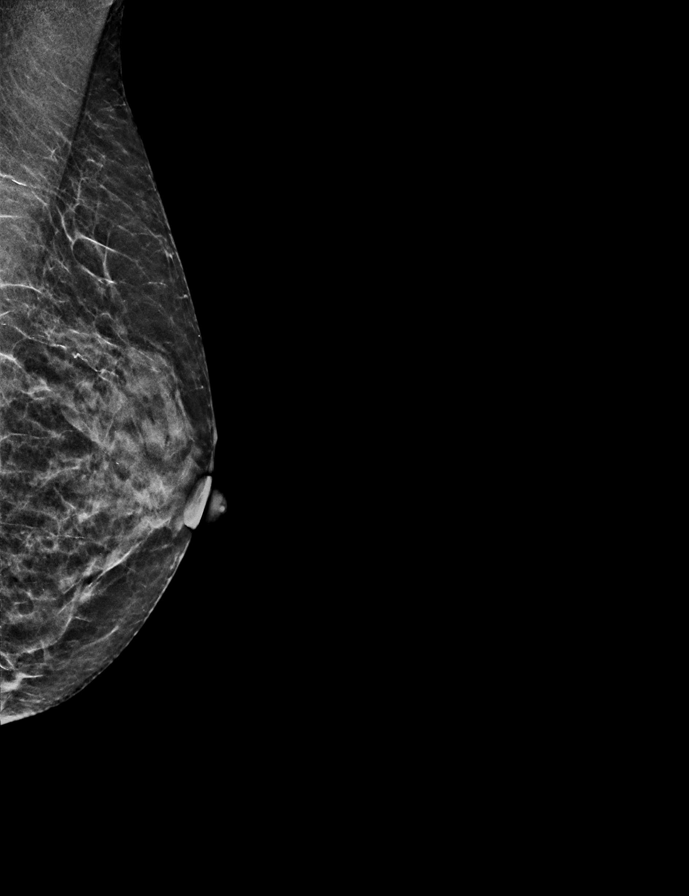

[R MLO synth-2D]
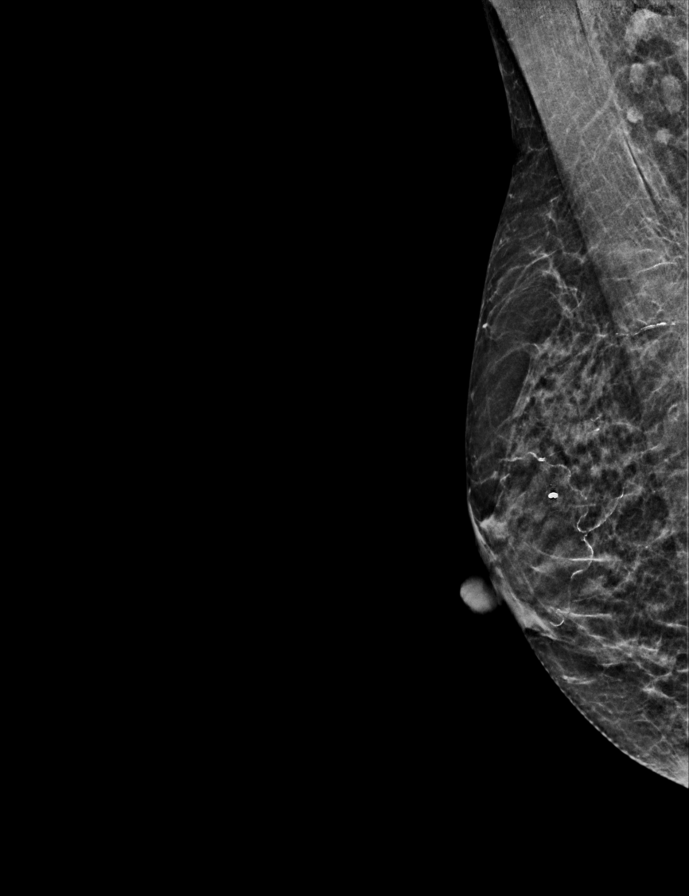

[R CC synth-2D]
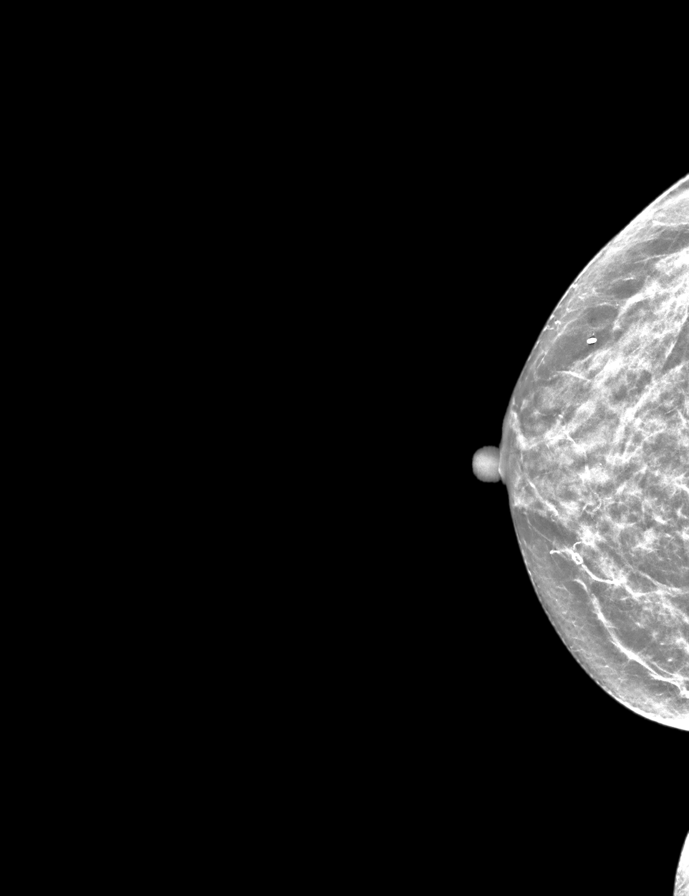

[L CC synth-2D]
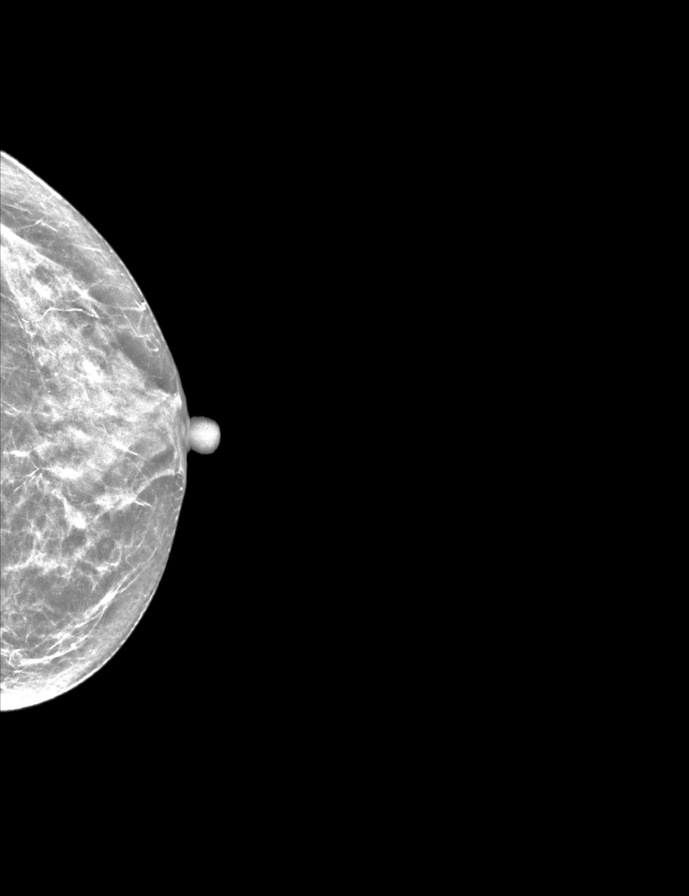

[R MLO tomo · 2 of 39 frames shown]
[frame 13/39]
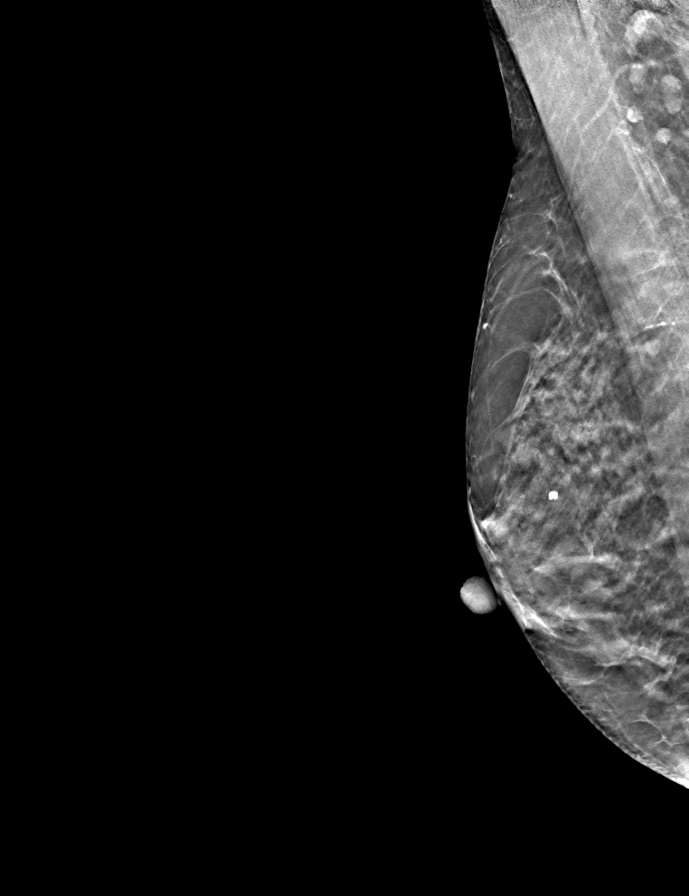
[frame 20/39]
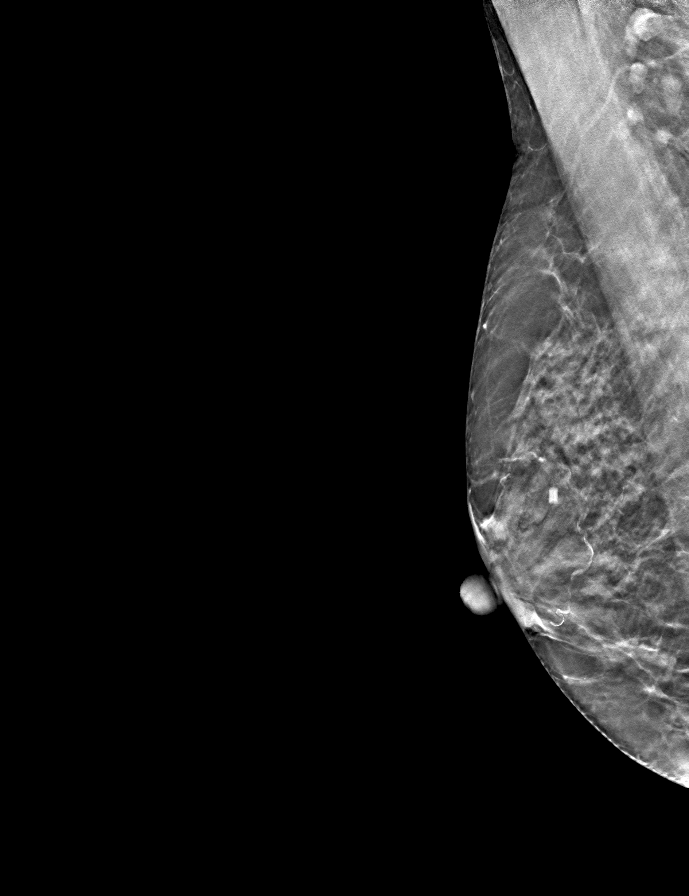

[R CC tomo · tomo slice 25/49.0]
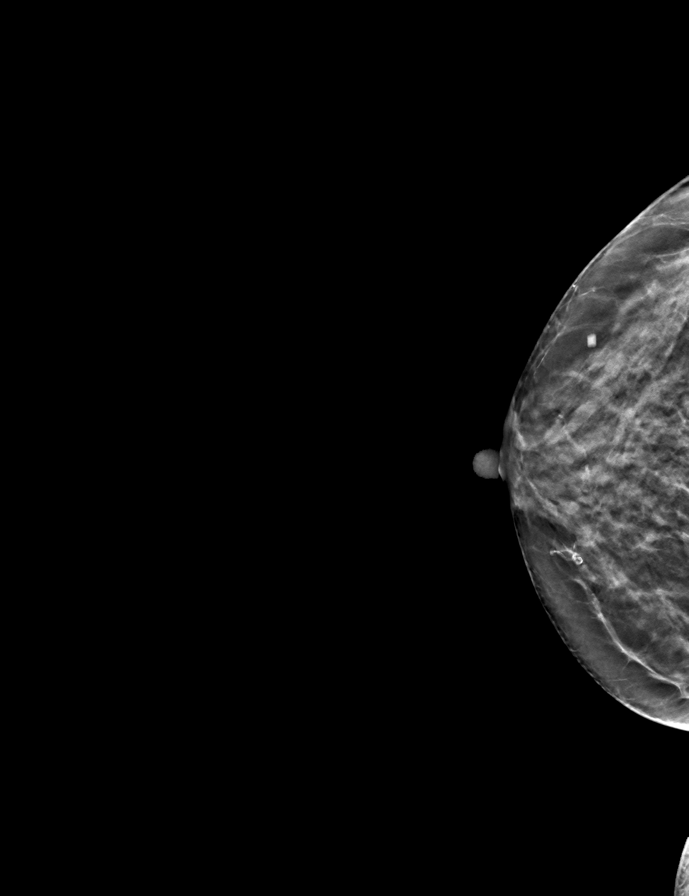

[L MLO tomo · tomo slice 21/40.0]
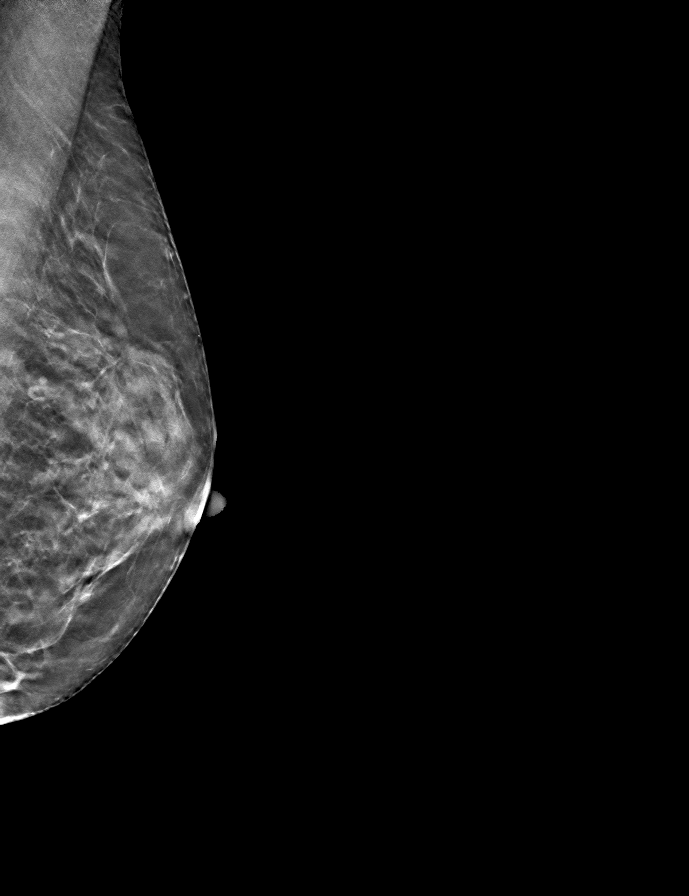

[L CC tomo · tomo slice 23/45.0]
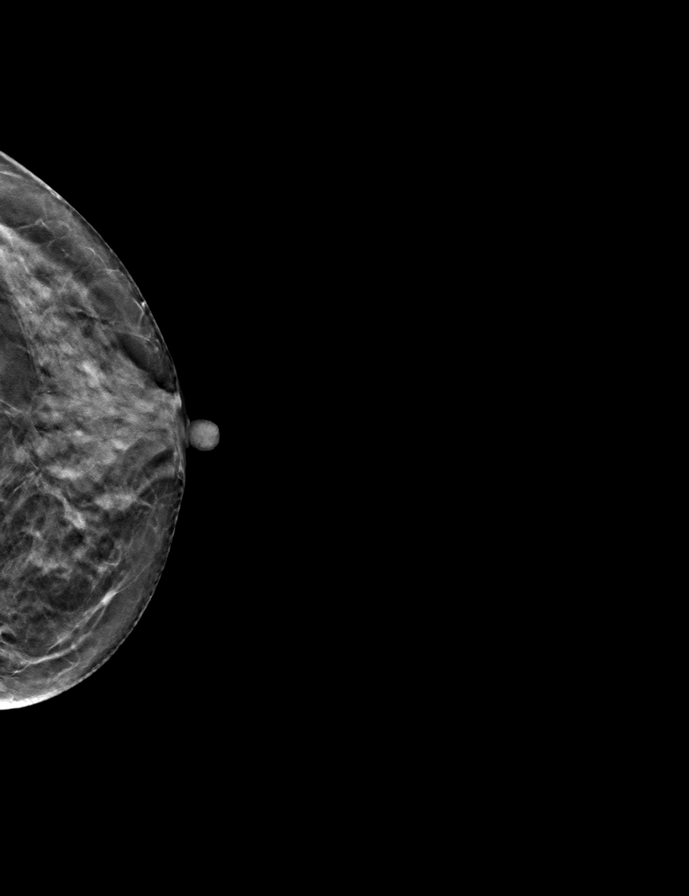

[9 of 24 positions shown; findings below may reference images not displayed]

ACR Breast Density Category c: The breast tissue is heterogeneously
dense, which may obscure small masses.
FINDINGS: There are no findings suspicious for malignancy. Images were
processed with CAD.
IMPRESSION: No mammographic evidence of malignancy. A result letter of this
screening mammogram will be mailed directly to the patient.

RECOMMENDATION:
Screening mammogram in one year. (Code:[5V])

BI-RADS CATEGORY  1: Negative.

## 2020-03-06 ENCOUNTER — Telehealth: Payer: Self-pay | Admitting: Cardiovascular Disease

## 2020-03-06 NOTE — Telephone Encounter (Signed)
Called pt back, advised her results have been uploaded to Dr. Mariah Milling, waiting for him to read/review at this time, pt verbalized understanding, will await phone call back with formal results and any recommendation if needed.

## 2020-03-06 NOTE — Telephone Encounter (Signed)
Patient calling to check the status of ZIO results  Please call to discuss

## 2020-03-07 ENCOUNTER — Other Ambulatory Visit: Payer: Self-pay

## 2020-03-07 ENCOUNTER — Ambulatory Visit
Admission: RE | Admit: 2020-03-07 | Discharge: 2020-03-07 | Disposition: A | Discharge status: Home / Self Care | Payer: Managed Care, Other (non HMO) | Source: Ambulatory Visit | Attending: Nurse Practitioner | Admitting: Nurse Practitioner

## 2020-03-07 DIAGNOSIS — Z87891 Personal history of nicotine dependence: Secondary | ICD-10-CM | POA: Insufficient documentation

## 2020-03-07 DIAGNOSIS — Z122 Encounter for screening for malignant neoplasm of respiratory organs: Secondary | ICD-10-CM | POA: Diagnosis present

## 2020-03-07 IMAGING — CT CT CHEST LUNG CANCER SCREENING LOW DOSE W/O CM
1 of 2 series · 15 of 40 positions shown, 19 images · non-contrast
Comparison: [DATE].

CLINICAL DATA: Current smoker, 35 pack-year history.

EXAM:
CT CHEST WITHOUT CONTRAST LOW-DOSE FOR LUNG CANCER SCREENING
TECHNIQUE: Multidetector CT imaging of the chest was performed following the
standard protocol without IV contrast.

[Series 2: axial st · axial · 0.57mm/px · z∈[-603,-318]mm · 15 of 63 slices shown, 19 images]
[im 3/63  mediastinal]
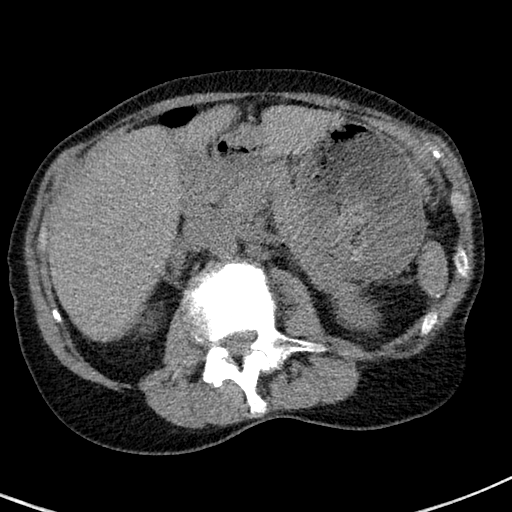
[im 3/63  lung]
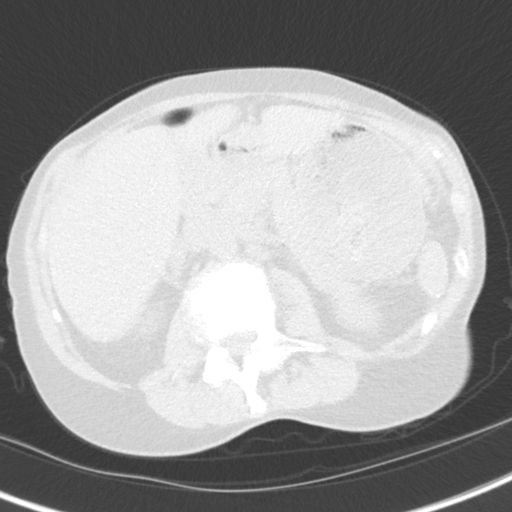
[im 8/63  lung]
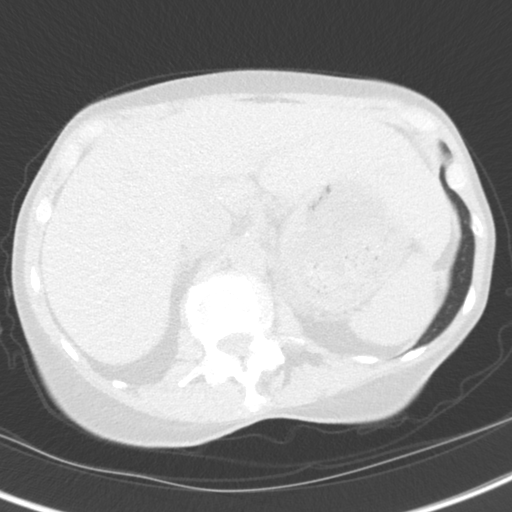
[im 12/63  lung]
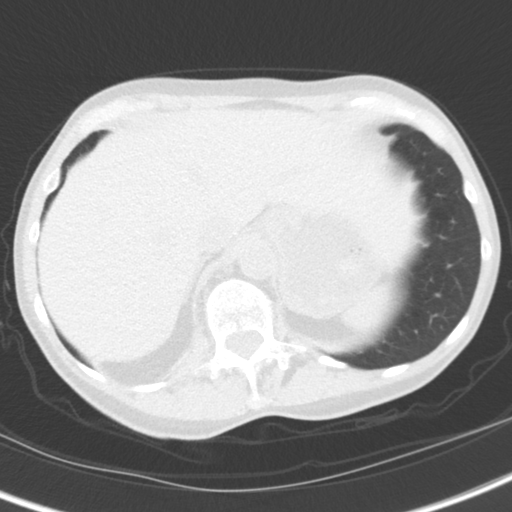
[im 16/63  lung]
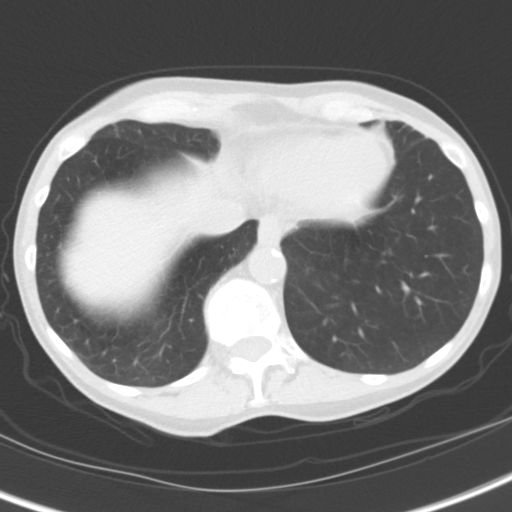
[im 20/63  mediastinal]
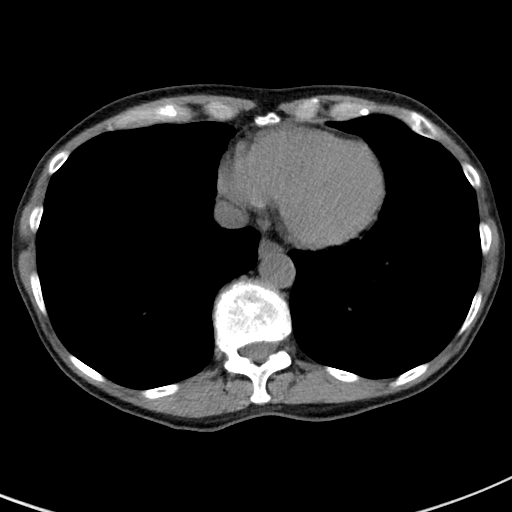
[im 20/63  lung]
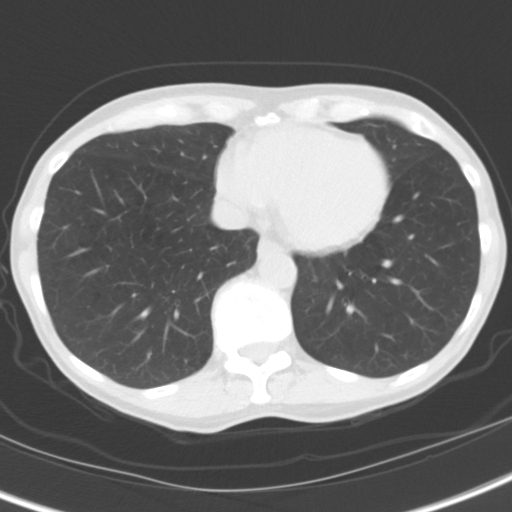
[im 24/63  lung]
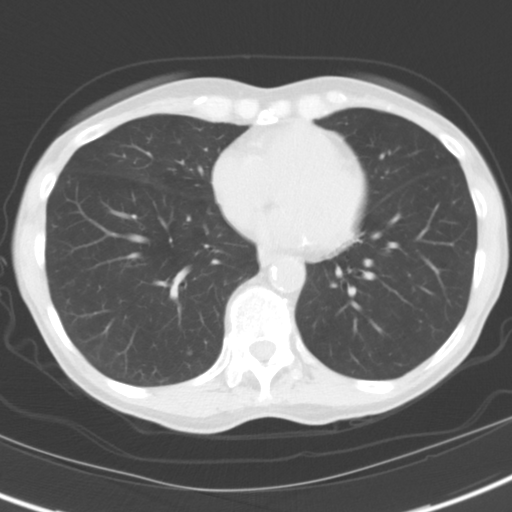
[im 29/63  lung]
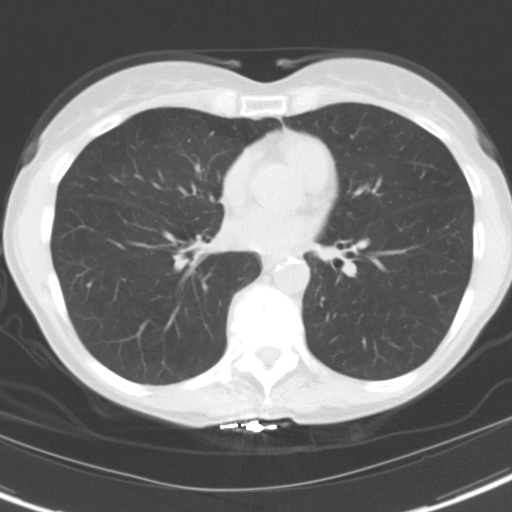
[im 32/63  lung]
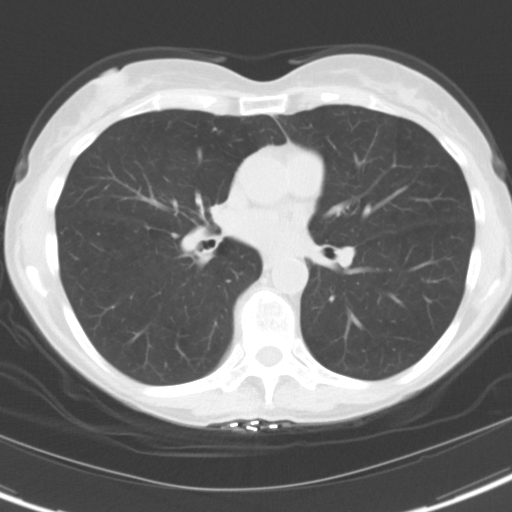
[im 34/63  mediastinal]
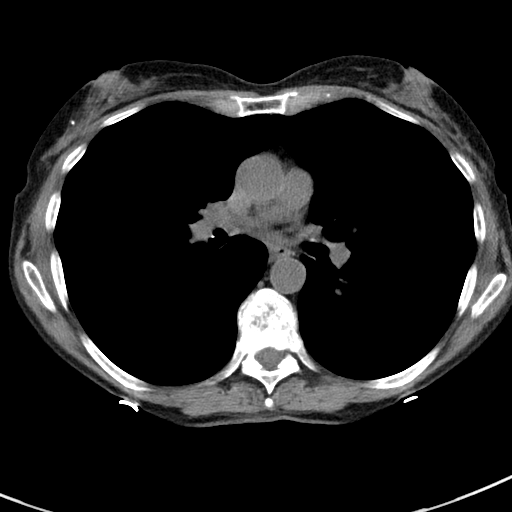
[im 34/63  lung]
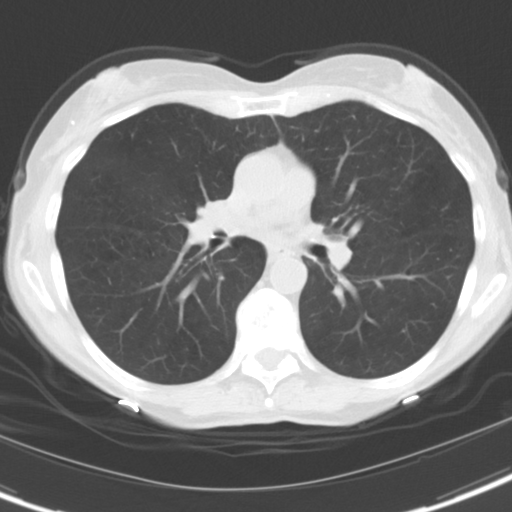
[im 39/63  lung]
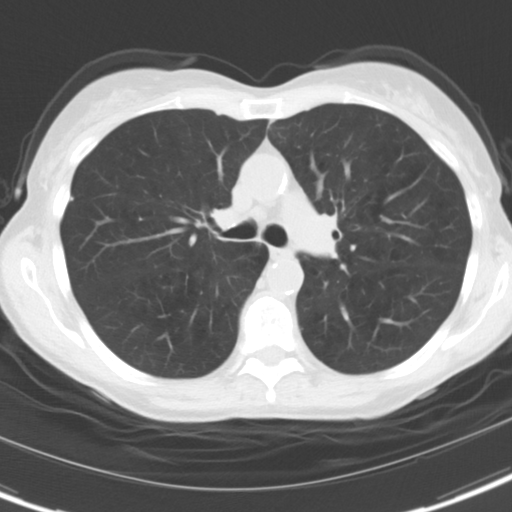
[im 43/63  lung]
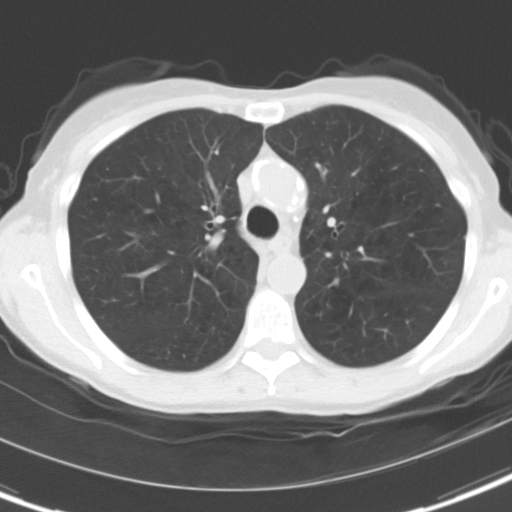
[im 47/63  lung]
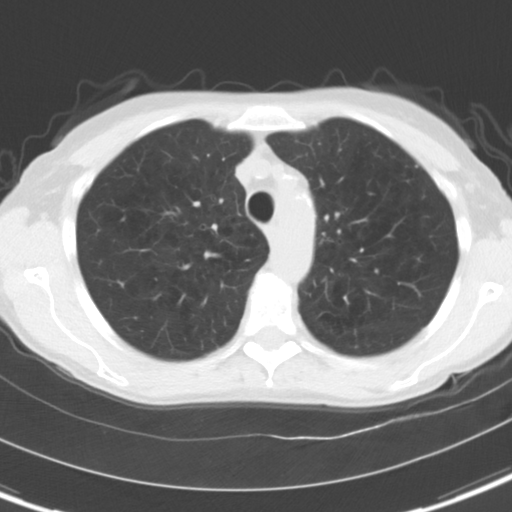
[im 51/63  mediastinal]
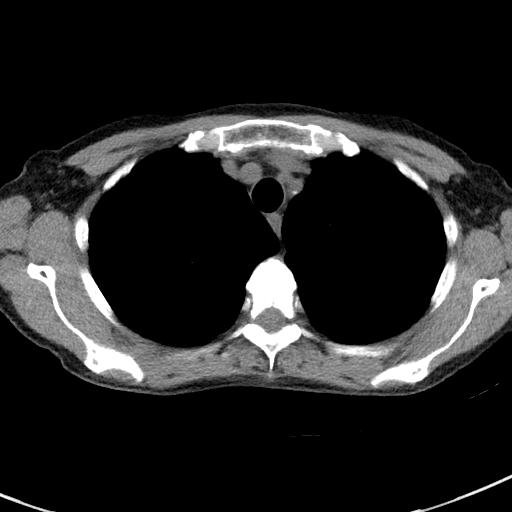
[im 51/63  lung]
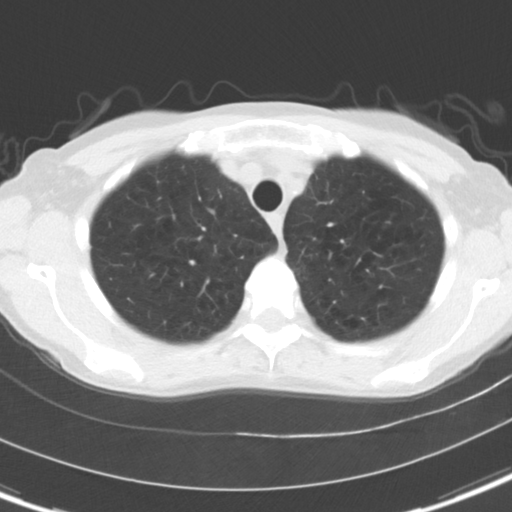
[im 55/63  lung]
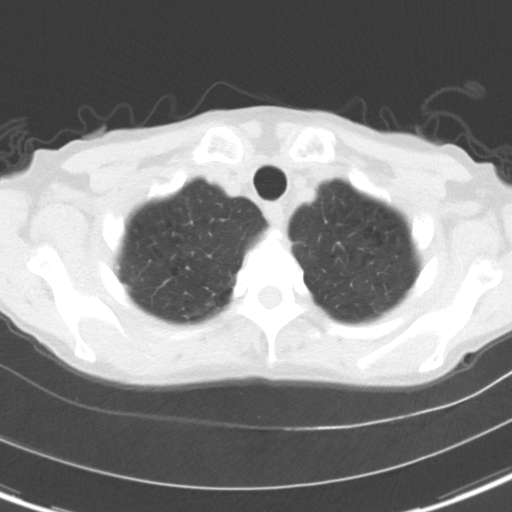
[im 60/63  lung]
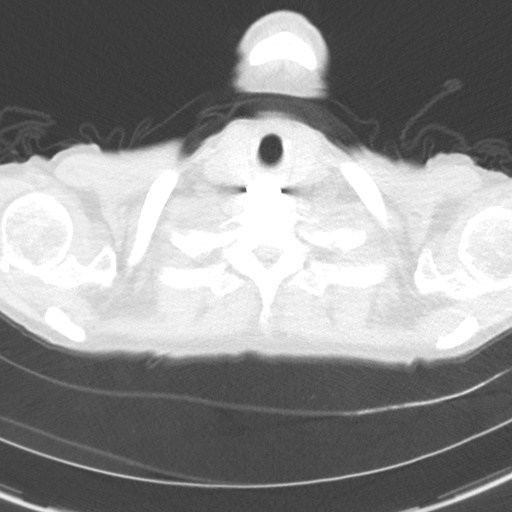

[15 of 40 positions shown; findings below may reference images not displayed]

FINDINGS: Cardiovascular: Atherosclerotic calcification of the aorta. Heart
size normal. No pericardial effusion.

Mediastinum/Nodes: No pathologically enlarged mediastinal or
axillary lymph nodes. Hilar regions are difficult to evaluate
without IV contrast. Esophagus is grossly unremarkable.

Lungs/Pleura: Biapical pleuroparenchymal scarring. Centrilobular
emphysema. Smoking related respiratory bronchiolitis. Calcified
granulomas. Perifissural lymph nodes. Pulmonary nodules measure
mm or less in size, as before. No pleural fluid. Airway is
unremarkable.

Upper Abdomen: Visualized portions of the liver, gallbladder,
adrenal glands, kidneys, spleen, pancreas, stomach and bowel are
grossly unremarkable.

Musculoskeletal: Degenerative changes in the spine. No worrisome
lytic or sclerotic lesions.
IMPRESSION: 1. Lung-RADS 2, benign appearance or behavior. Continue annual
screening with low-dose chest CT without contrast in 12 months.
2.  Aortic atherosclerosis ([TO]-[TO]).
3.  Emphysema ([TO]-[TO]).

## 2020-03-12 ENCOUNTER — Telehealth: Payer: Self-pay

## 2020-03-12 NOTE — Telephone Encounter (Signed)
-----   Message from Antonieta Iba, MD sent at 03/07/2020  5:51 PM EST ----- Event monitor  1 patient triggered event, not associated with significant arrhythmia No significant arrhythmia  Normal sinus rhythm min HR of 60 bpm, max HR of 207 bpm, and avg HR of 85 bpm.  1 run of Ventricular Tachycardia occurred lasting 8 beats with a max rate of 146 bpm (avg 129 bpm).   26 Supraventricular Tachycardia runs occurred, the run with the fastest interval lasting 5 beats with a max rate of 207 bpm, the longest lasting 17 beats with an avg rate of 126 bpm.

## 2020-03-12 NOTE — Telephone Encounter (Signed)
Able to reach pt regarding her recent Zio monitor, Dr. Mariah Milling had a chance to review her results and advised   "1 patient triggered event, not associated with significant arrhythmia No significant arrhythmia  Normal sinus rhythm min HR of 60 bpm, max HR of 207 bpm, and avg HR of 85 bpm.  1 run of Ventricular Tachycardia occurred lasting 8 beats with a max rate of 146 bpm (avg 129 bpm).   26 Supraventricular Tachycardia runs occurred, the run with the fastest interval lasting 5 beats with a max rate of 207 bpm, the longest lasting 17 beats with an avg rate of 126 bpm."   Pt verbalized understanding of results, advise overall all good results and average HR good. Advise pt to monitor for irregular hear rhythms and keep a log of them for reference.Pt verbalized understanding,  otherwise all questions or concerns were address and no additional concerns at this time, will call back for anything further.

## 2020-03-13 ENCOUNTER — Telehealth: Payer: Self-pay

## 2020-03-13 NOTE — Telephone Encounter (Signed)
Spoke with pt yesterday regarding her Zio results, Dr. Mariah Milling uploaded additional noted today regarding her monitor wear "No atrial fib  Rare episodes of tachycardia, all less than 30 sec long  Triggered event not associated with arrhythmia  No medications needed but if these rare short burts cause symptoms,  Metoprolol succinate 12.5 up to 25 mg daily could be started" As of conversation yesterday, pt was happy that she did not half to start any new medications, pt is keeping a journal of when/if she has a trigger event to bring at her next appt with Dr. Mariah Milling. Maybe then medication can be talked about, pt hesitance of being on "too many medications". Pt will call clinic for any concerns or questions.

## 2020-03-14 ENCOUNTER — Encounter: Payer: Self-pay | Admitting: *Deleted

## 2020-05-01 ENCOUNTER — Other Ambulatory Visit (HOSPITAL_COMMUNITY): Payer: Self-pay | Admitting: Physical Medicine & Rehabilitation

## 2020-05-01 ENCOUNTER — Other Ambulatory Visit: Payer: Self-pay | Admitting: Physical Medicine & Rehabilitation

## 2020-05-01 DIAGNOSIS — M5414 Radiculopathy, thoracic region: Secondary | ICD-10-CM

## 2020-05-01 DIAGNOSIS — M546 Pain in thoracic spine: Secondary | ICD-10-CM

## 2020-05-10 ENCOUNTER — Other Ambulatory Visit: Payer: Self-pay | Admitting: Physical Medicine & Rehabilitation

## 2020-05-10 DIAGNOSIS — M5414 Radiculopathy, thoracic region: Secondary | ICD-10-CM

## 2020-05-10 DIAGNOSIS — M546 Pain in thoracic spine: Secondary | ICD-10-CM

## 2020-05-12 ENCOUNTER — Ambulatory Visit: Payer: Medicare Other

## 2020-05-16 ENCOUNTER — Ambulatory Visit
Admission: RE | Admit: 2020-05-16 | Discharge: 2020-05-16 | Disposition: A | Discharge status: Home / Self Care | Payer: Managed Care, Other (non HMO) | Source: Ambulatory Visit | Attending: Physical Medicine & Rehabilitation | Admitting: Physical Medicine & Rehabilitation

## 2020-05-16 DIAGNOSIS — M5414 Radiculopathy, thoracic region: Secondary | ICD-10-CM

## 2020-05-16 DIAGNOSIS — M546 Pain in thoracic spine: Secondary | ICD-10-CM

## 2020-05-16 IMAGING — MR MR THORACIC SPINE W/O CM
4 of 5 series · 17 of 48 positions shown · non-contrast
Comparison: Chest CT [DATE]

CLINICAL DATA: Lower back pain, worse on the right for 1 year.

EXAM:
MRI THORACIC SPINE WITHOUT CONTRAST
TECHNIQUE: Multiplanar, multisequence MR imaging of the thoracic spine was
performed. No intravenous contrast was administered.

[Series 4: T2 · sagittal · 4.0mm · 0.50mm/px · 6 of 17 slices shown (1 of 3)]
[im 1/17]
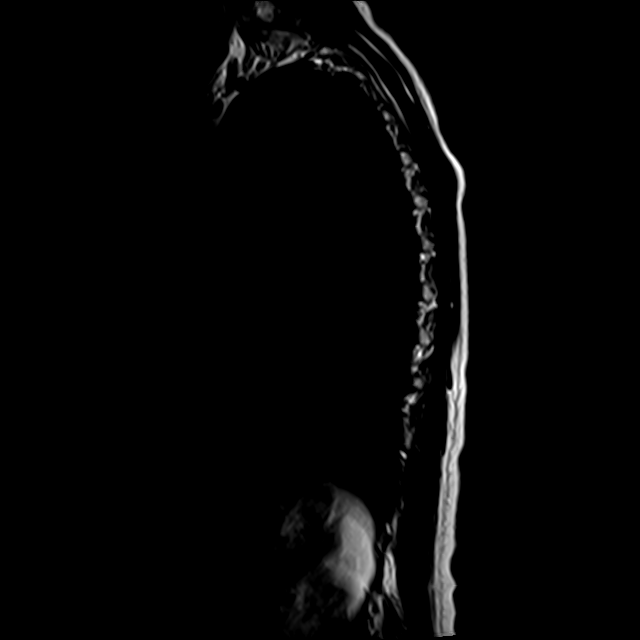
[im 4/17]
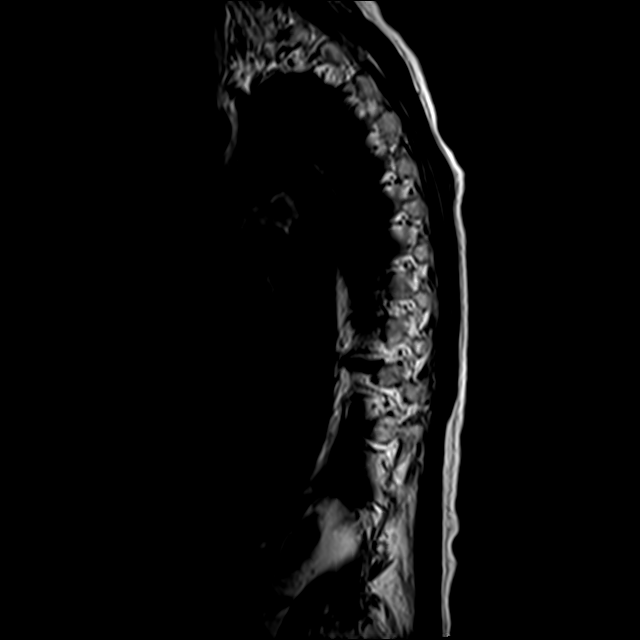
[im 7/17]
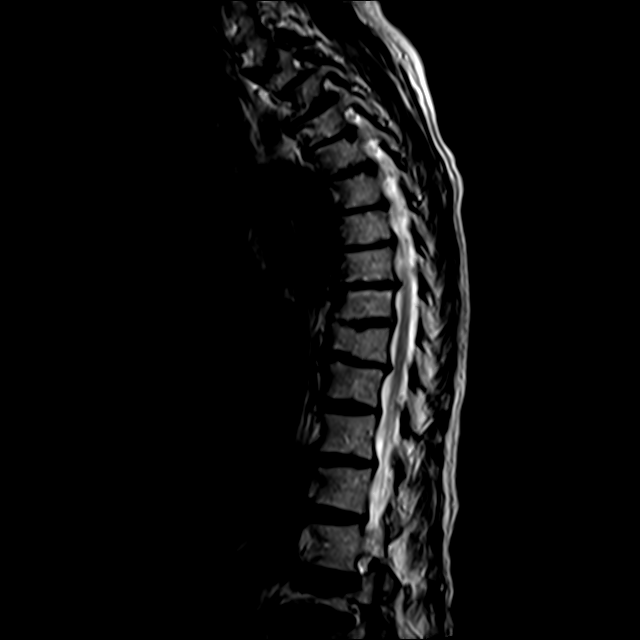
[im 10/17]
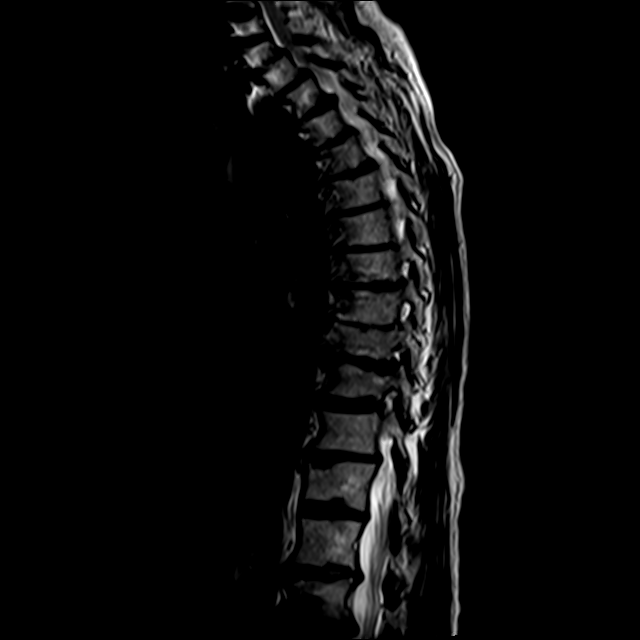
[im 13/17]
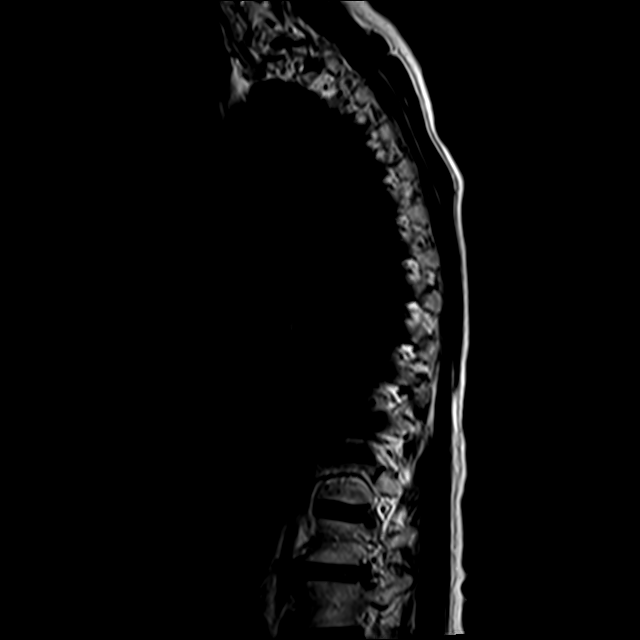
[im 17/17]
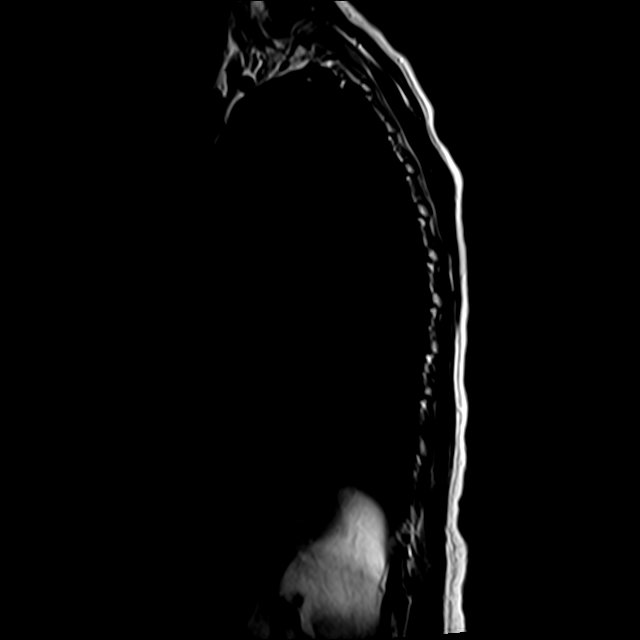

[Series 6: T1 · sagittal · 4.0mm · 1.00mm/px · 3 of 17 slices shown]
[im 4/17]
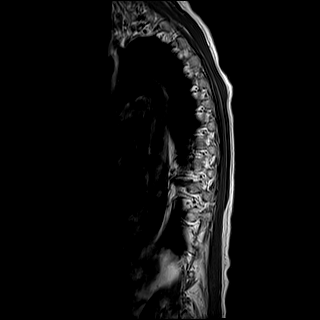
[im 10/17]
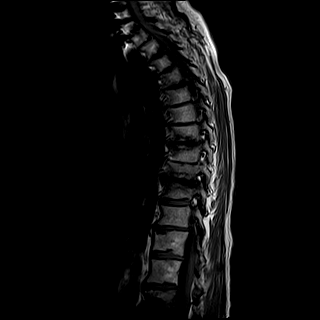
[im 17/17]
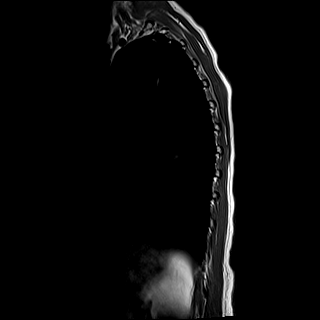

[Series 7: T2 · axial · 4.0mm · 0.39mm/px · z∈[-261,-99]mm · 5 of 39 slices shown (2 of 3)]
[im 1/39]
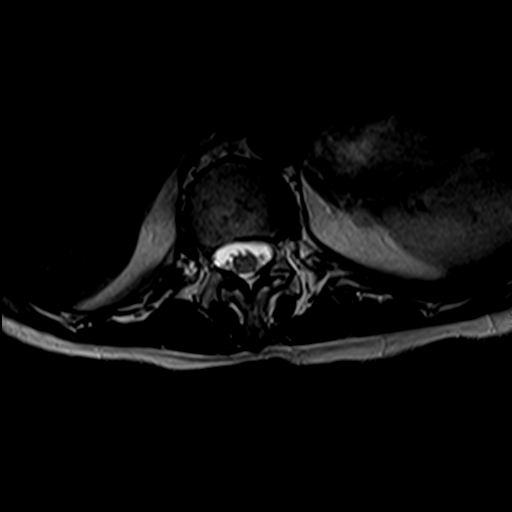
[im 6/39]
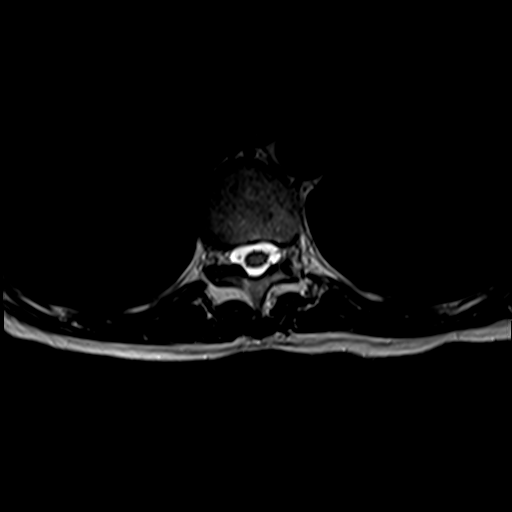
[im 11/39]
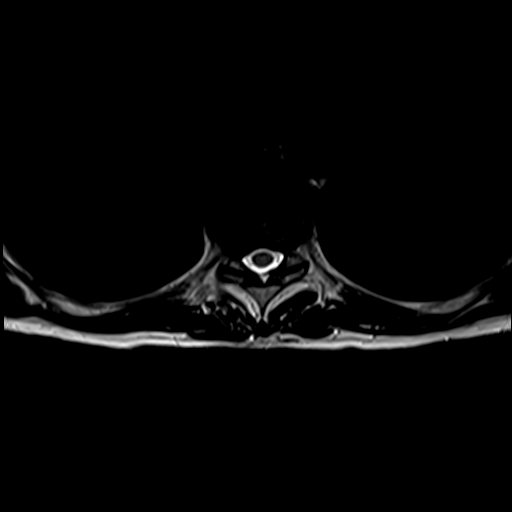
[im 20/39]
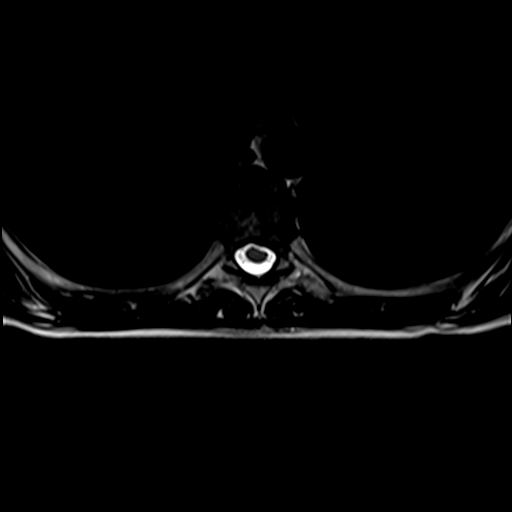
[im 33/39]
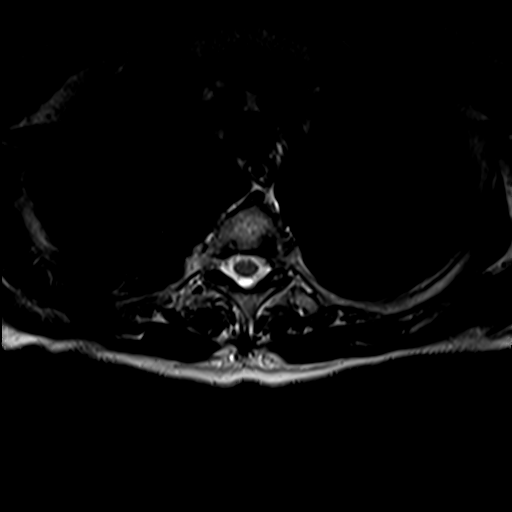

[Series 8: T2 · axial · 4.0mm · 0.39mm/px · z∈[-224,-99]mm · 3 of 39 slices shown (3 of 3)]
[im 6/39]
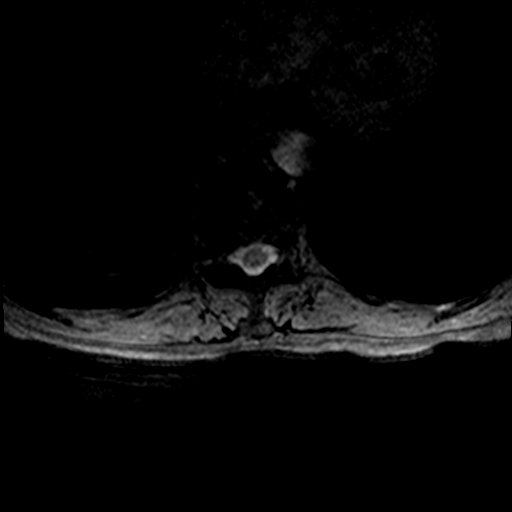
[im 20/39]
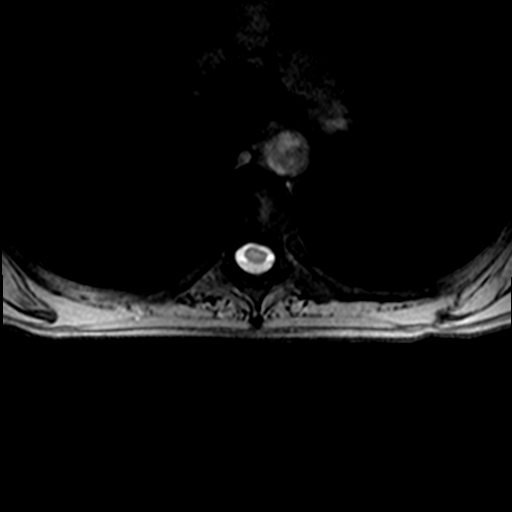
[im 33/39]
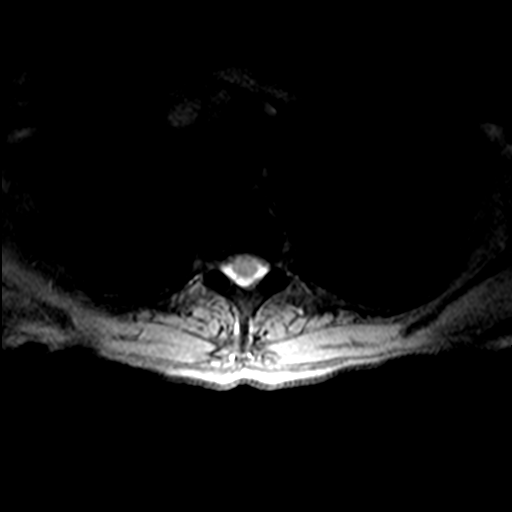

[17 of 48 positions shown; findings below may reference images not displayed]

FINDINGS: Alignment: Exaggerated thoracic kyphosis with slight thoracic
levocurvature which is compensatory to lumbar dextroscoliosis by CT.
Facet mediated anterolisthesis at seen at C7-T1 and T1-2. At T1-2
slip is measured at 5 mm.

Vertebrae: Discogenic endplate edema at T7-8 and T9-10, the levels
of most advanced disc degeneration. No fracture, discitis, or
aggressive bone lesion. Partial coverage of L1-2 shows prominent
sclerosis with degenerative appearance by prior CT. Multilevel ACDF.

Cord: Mild degenerative cord flattening at C7-T1 and T1-2. No cord
edema. No masslike finding in the thecal sac.

Paraspinal and other soft tissues: Negative

Disc levels:

Advanced disc narrowing with endplate degeneration in the upper and
midthoracic levels with better preserved disc height at T10-11 to
T12-L1. Discogenic endplate edema at T7-8 and T9-10 as noted above.
Multilevel facet spurring greatest at C7-T1 and T1-2 where there is
anterolisthesis.

Spinal stenosis most notable at C7-T1 and T1-2 where there is mild
cord mass effect on sagittal images.

Biforaminal stenosis at T1-2 and C7-T1 due to slip, disc height
loss, and spurring. Ankylosis may have occurred at C7-T1. ACDF
hardware screws extend to this severely narrowed disc space by CT.

Notable right foraminal impingement at T9-10 due to eccentric disc
protrusion.
IMPRESSION: 1. Generalized, advanced disc degeneration with discogenic endplate
edema at T7-8 and T9-10.
2. Facet osteoarthritis especially at C7-T1 and T1-2 where there is
anterolisthesis.
3. Spinal stenosis with mild cord flattening at C7-T1 (where there
may be ankylosis) and T1-2. Biforaminal impingement is seen at both
of these levels.
4. T9-10 right foraminal impingement due to disc protrusion.

## 2020-05-24 ENCOUNTER — Other Ambulatory Visit: Payer: Self-pay

## 2020-05-24 ENCOUNTER — Emergency Department: Payer: Managed Care, Other (non HMO)

## 2020-05-24 ENCOUNTER — Emergency Department
Admission: EM | Admit: 2020-05-24 | Discharge: 2020-05-24 | Disposition: A | Discharge status: Home / Self Care | Payer: Managed Care, Other (non HMO) | Attending: Emergency Medicine | Admitting: Emergency Medicine

## 2020-05-24 ENCOUNTER — Telehealth: Payer: Self-pay | Admitting: Internal Medicine

## 2020-05-24 DIAGNOSIS — J449 Chronic obstructive pulmonary disease, unspecified: Secondary | ICD-10-CM | POA: Diagnosis not present

## 2020-05-24 DIAGNOSIS — F17210 Nicotine dependence, cigarettes, uncomplicated: Secondary | ICD-10-CM | POA: Insufficient documentation

## 2020-05-24 DIAGNOSIS — I480 Paroxysmal atrial fibrillation: Secondary | ICD-10-CM | POA: Insufficient documentation

## 2020-05-24 DIAGNOSIS — Z7901 Long term (current) use of anticoagulants: Secondary | ICD-10-CM | POA: Diagnosis not present

## 2020-05-24 DIAGNOSIS — R002 Palpitations: Secondary | ICD-10-CM | POA: Diagnosis present

## 2020-05-24 DIAGNOSIS — Z79899 Other long term (current) drug therapy: Secondary | ICD-10-CM | POA: Insufficient documentation

## 2020-05-24 DIAGNOSIS — I10 Essential (primary) hypertension: Secondary | ICD-10-CM | POA: Insufficient documentation

## 2020-05-24 LAB — CBC
HCT: 36.6 % (ref 36.0–46.0)
Hemoglobin: 12.2 g/dL (ref 12.0–15.0)
MCH: 31.2 pg (ref 26.0–34.0)
MCHC: 33.3 g/dL (ref 30.0–36.0)
MCV: 93.6 fL (ref 80.0–100.0)
Platelets: 349 10*3/uL (ref 150–400)
RBC: 3.91 MIL/uL (ref 3.87–5.11)
RDW: 13 % (ref 11.5–15.5)
WBC: 12 10*3/uL — ABNORMAL HIGH (ref 4.0–10.5)
nRBC: 0 % (ref 0.0–0.2)

## 2020-05-24 LAB — BASIC METABOLIC PANEL
Anion gap: 11 (ref 5–15)
BUN: 17 mg/dL (ref 8–23)
CO2: 24 mmol/L (ref 22–32)
Calcium: 9.9 mg/dL (ref 8.9–10.3)
Chloride: 105 mmol/L (ref 98–111)
Creatinine, Ser: 0.75 mg/dL (ref 0.44–1.00)
GFR, Estimated: >60 mL/min (ref >60)
Glucose, Bld: 83 mg/dL (ref 70–99)
Potassium: 4.2 mmol/L (ref 3.5–5.1)
Sodium: 140 mmol/L (ref 135–145)

## 2020-05-24 LAB — MAGNESIUM: Magnesium: 2.3 mg/dL (ref 1.7–2.4)

## 2020-05-24 IMAGING — CR DG CHEST 2V
1 series · 2 of 2 positions shown · non-contrast
Comparison: None.

CLINICAL DATA: Atrial fibrillation, hypertension, tobacco abuse

EXAM:
CHEST - 2 VIEW

[Series 1: dg chest 2 view · 0.14mm/px · 2 of 2 slices shown]
[im 1/2]
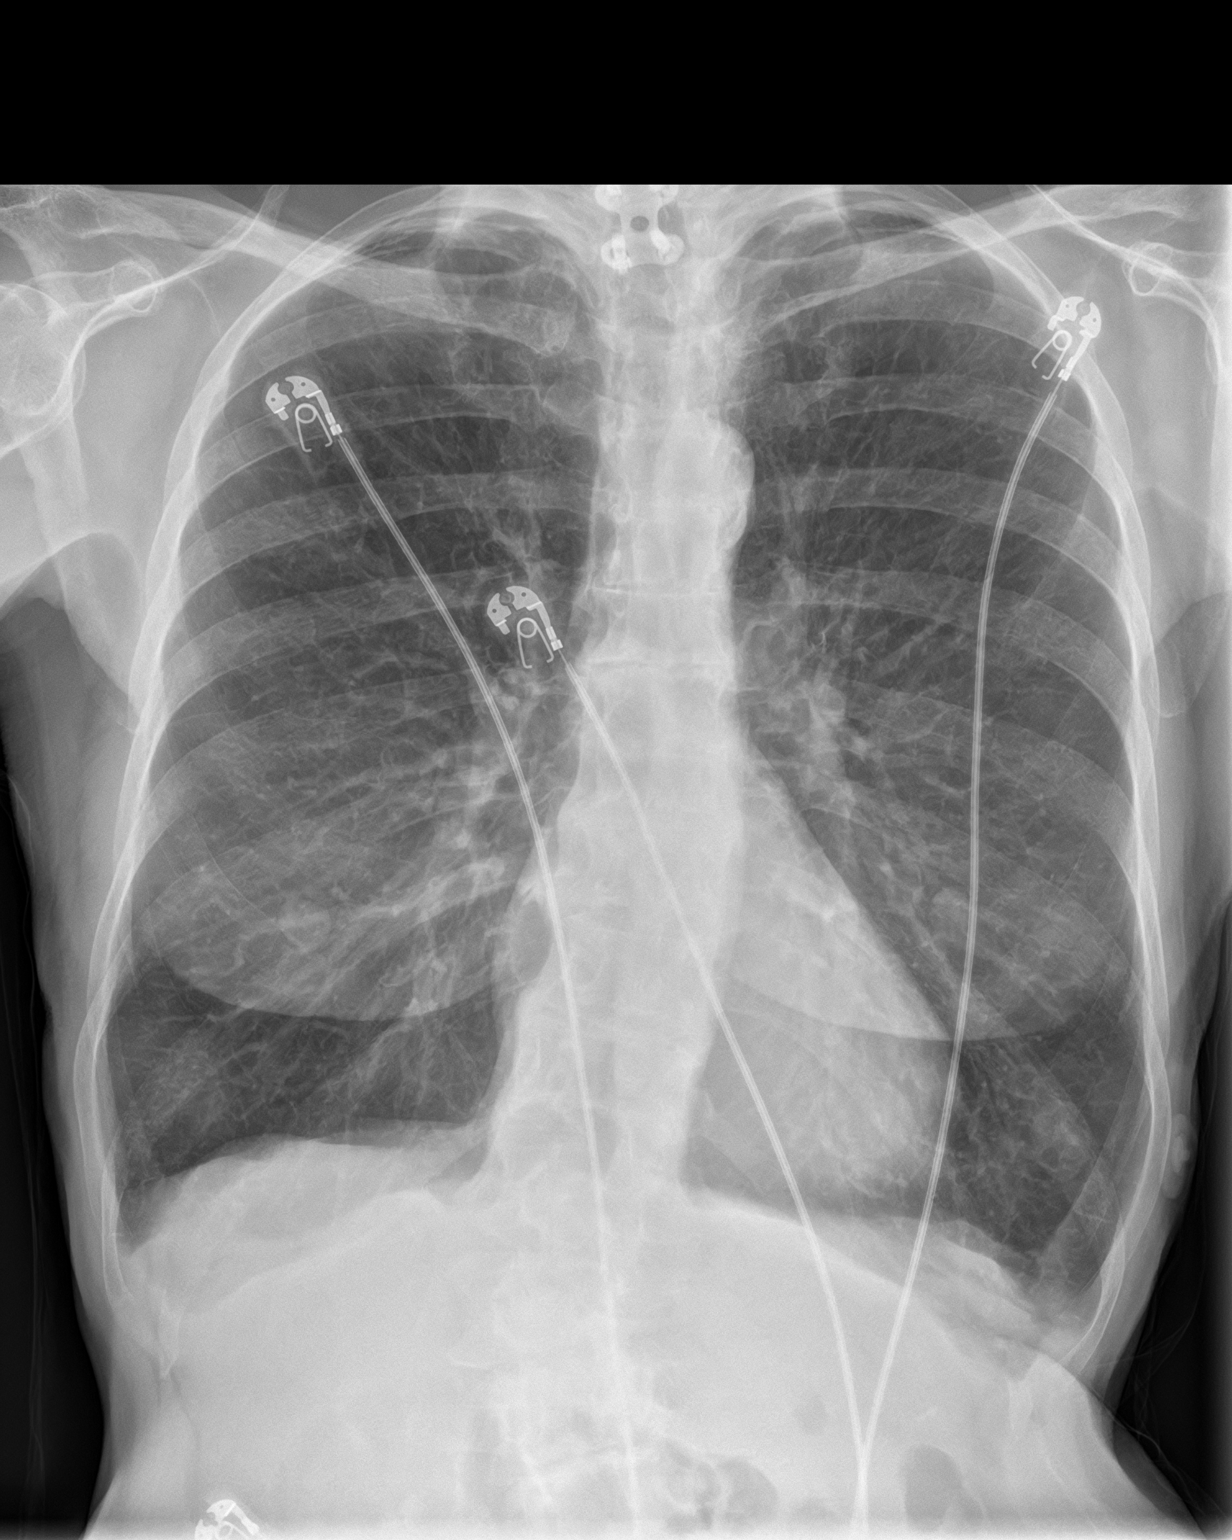
[im 2/2]
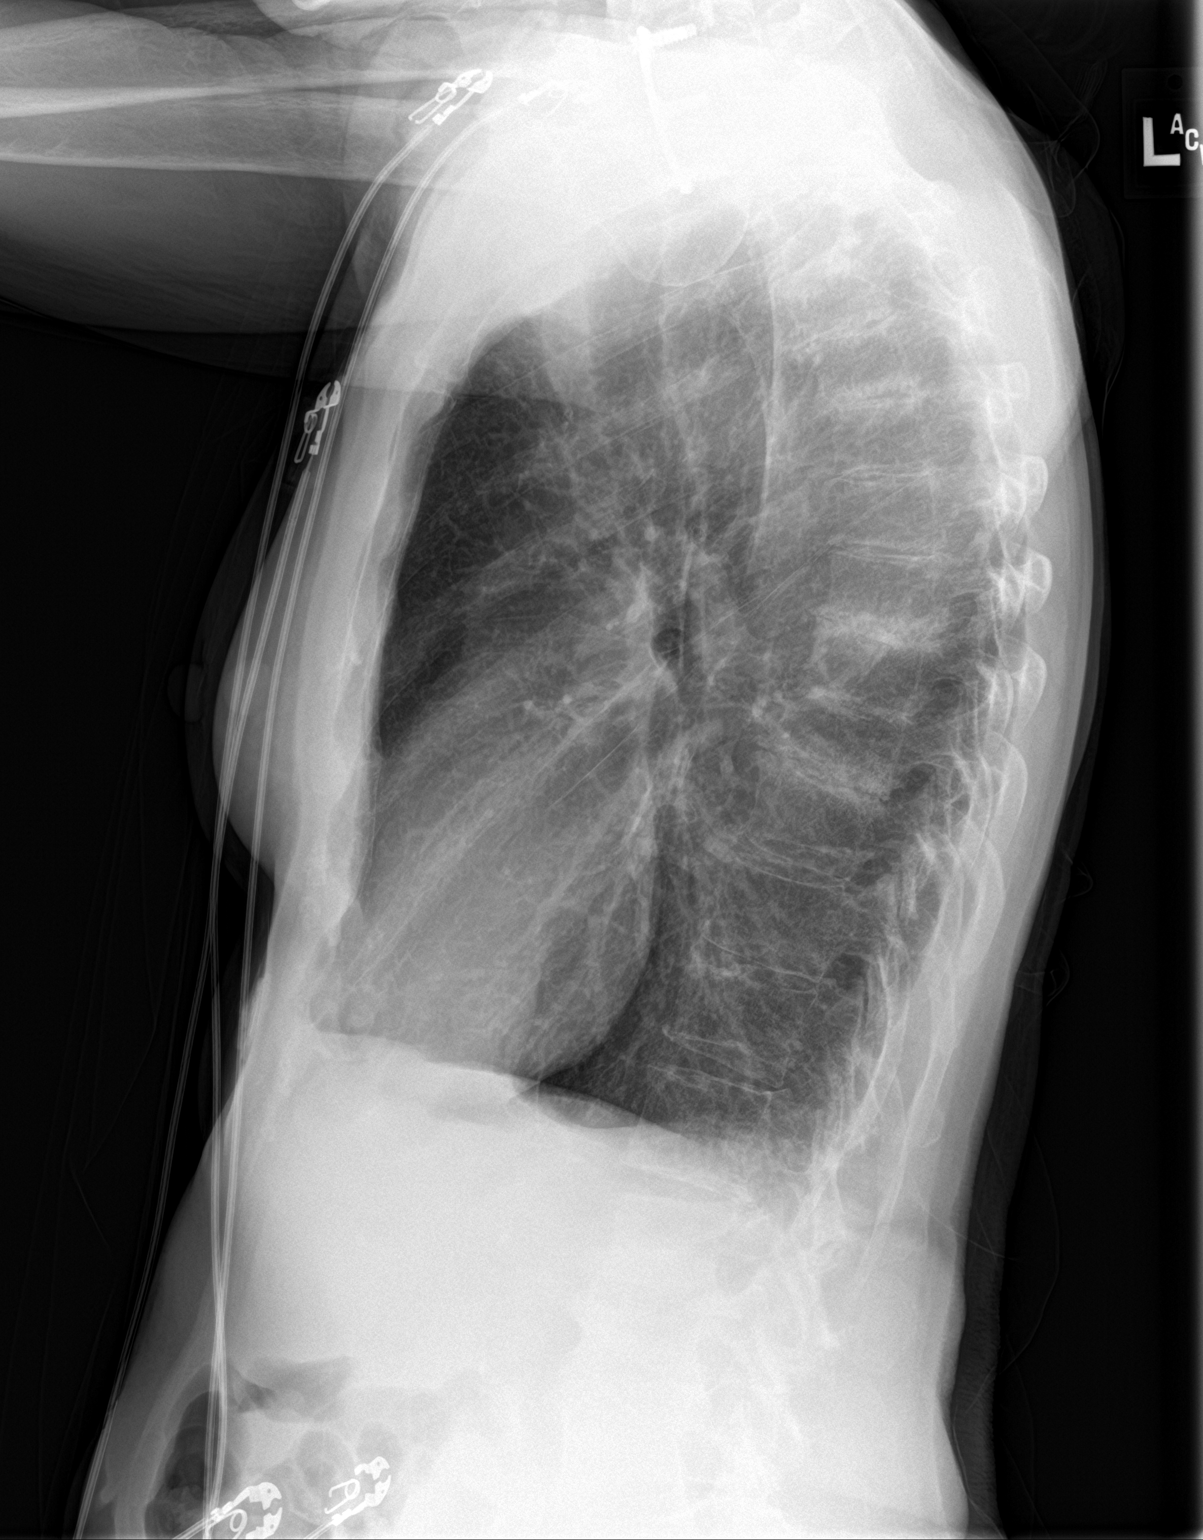

[2 of 2 positions shown; findings below may reference images not displayed]

FINDINGS: Frontal and lateral views of the chest demonstrate an unremarkable
cardiac silhouette. Hyperinflation and background emphysema are
noted without airspace disease, effusion, or pneumothorax. No acute
bony abnormalities.
IMPRESSION: 1. Emphysema, no acute intrathoracic process.

## 2020-05-24 MED ORDER — APIXABAN 5 MG PO TABS: 5.0000 mg | ORAL_TABLET | Freq: Two times a day (BID) | ORAL | 1 refills | Status: DC

## 2020-05-24 MED ORDER — METOPROLOL TARTRATE 25 MG PO TABS: 25.0000 mg | ORAL_TABLET | Freq: Two times a day (BID) | ORAL | 1 refills | Status: DC | PRN

## 2020-05-24 NOTE — ED Provider Notes (Signed)
[facility] Emergency Department Provider Note   ____________________________________________   Event Date/Time   First MD Initiated Contact with Patient 05/24/20 1704     (approximate)  I have reviewed the triage vital signs and the nursing notes.   HISTORY  Chief Complaint Atrial Fibrillation    HPI Carrie Meyers is a 70 y.o. female with possible history of hypertension, hyperlipidemia, COPD, and paroxysmal tachycardia who presents to the ED complaining of palpitations.  Patient reports that around noon today she started having a feeling that her heart was racing and beating irregularly.  She states this felt similar to her prior episodes and when she checked her heart rhythm on her [employer] monitor, it stated she was in atrial fibrillation with rates ranging from 140-160.  She has had similar episodes of palpitations in the past, but when she had a cardiac event monitor placed, it did not show any atrial fibrillation.  She does not currently take any blood thinners or rate control medications.  She reports taking a dose of her husband's Cardizem and by the time EMS arrived, symptoms were much improved.  She denies any associated chest pain or shortness of breath, palpitations have now resolved.        Past Medical History:  Diagnosis Date  . A-fib (HCC)   . Abdominal bloating   . Atherosclerosis of native coronary artery   . Depression   . Fatigue   . Generalized anxiety disorder   . Hip pain, bilateral   . History of emphysema   . Hyperlipidemia   . Hypertension   . Panic attacks   . Pruritus of scalp   . Right-sided back pain   . Skin lesion     There are no problems to display for this patient.   Past Surgical History:  Procedure Laterality Date  . APPENDECTOMY    . BREAST BIOPSY Left 2000   negative  . BREAST EXCISIONAL BIOPSY Left 2000   negative  . c section    . NECK SURGERY      Prior to Admission medications    Medication Sig Start Date End Date Taking? Authorizing Provider  apixaban (ELIQUIS) 5 MG TABS tablet Take 1 tablet (5 mg total) by mouth 2 (two) times daily. 05/24/20 07/23/20 Yes Chesley Noon, MD  metoprolol tartrate (LOPRESSOR) 25 MG tablet Take 1 tablet (25 mg total) by mouth 2 (two) times daily as needed (Palpitations). 05/24/20 05/24/21 Yes Chesley Noon, MD  cetirizine (ZYRTEC) 10 MG tablet Take 10 mg by mouth daily.    [provider]  lisinopril (ZESTRIL) 20 MG tablet Take 20 mg by mouth daily.    [provider]  LORazepam (ATIVAN) 0.5 MG tablet Take 1 tablet by mouth daily as needed. 08/03/19   [provider]  pantoprazole (PROTONIX) 20 MG tablet Take 20 mg by mouth daily.    [provider]  rosuvastatin (CRESTOR) 10 MG tablet Take 10 mg by mouth daily. 11/03/19   [provider]    Allergies Patient has no allergy information on record.  Family History  Problem Relation Age of Onset  . Breast cancer Neg Hx     Social History Social History   Tobacco Use  . Smoking status: Current Every Day Smoker    Packs/day: 0.75    Years: 45.00    Pack years: 33.75    Types: Cigarettes  . Smokeless tobacco: Never Used  Vaping Use  . Vaping Use: Never used  Substance  Use Topics  . Alcohol use: Never  . Drug use: Never    Review of Systems  Constitutional: No fever/chills Eyes: No visual changes. ENT: No sore throat. Cardiovascular: Denies chest pain.  Positive for palpitations. Respiratory: Denies shortness of breath. Gastrointestinal: No abdominal pain.  No nausea, no vomiting.  No diarrhea.  No constipation. Genitourinary: Negative for dysuria. Musculoskeletal: Negative for back pain. Skin: Negative for rash. Neurological: Negative for headaches, focal weakness or numbness.  ____________________________________________   PHYSICAL EXAM:  VITAL SIGNS: ED Triage Vitals  Enc Vitals Group     BP 05/24/20 1701 131/63      Pulse Rate 05/24/20 1701 97     Resp 05/24/20 1701 16     Temp 05/24/20 1701 98.4 F (36.9 C)     Temp Source 05/24/20 1701 Oral     SpO2 05/24/20 1701 100 %     Weight 05/24/20 1702 100 lb (45.4 kg)     Height 05/24/20 1702 5\' 2"  (1.575 m)     Head Circumference --      Peak Flow --      Pain Score 05/24/20 1702 0     Pain Loc --      Pain Edu? --      Excl. in GC? --     Constitutional: Alert and oriented. Eyes: Conjunctivae are normal. Head: Atraumatic. Nose: No congestion/rhinnorhea. Mouth/Throat: Mucous membranes are moist. Neck: Normal ROM Cardiovascular: Normal rate, regular rhythm. Grossly normal heart sounds.  2+ radial pulses bilaterally. Respiratory: Normal respiratory effort.  No retractions. Lungs CTAB. Gastrointestinal: Soft and nontender. No distention. Genitourinary: deferred Musculoskeletal: No lower extremity tenderness nor edema. Neurologic:  Normal speech and language. No gross focal neurologic deficits are appreciated. Skin:  Skin is warm, dry and intact. No rash noted. Psychiatric: Mood and affect are normal. Speech and behavior are normal.  ____________________________________________   LABS (all labs ordered are listed, but only abnormal results are displayed)  Labs Reviewed  CBC - Abnormal; Notable for the following components:      Result Value   WBC 12.0 (*)    All other components within normal limits  BASIC METABOLIC PANEL  MAGNESIUM   ____________________________________________  EKG  ED ECG REPORT I, 05/26/20, the attending physician, personally viewed and interpreted this ECG.   Date: 05/24/2020  EKG Time: 17:08  Rate: 98  Rhythm: normal sinus rhythm  Axis: Normal  Intervals:none  ST&T Change: None   PROCEDURES  Procedure(s) performed (including Critical Care):  Procedures   ____________________________________________   INITIAL IMPRESSION / ASSESSMENT AND PLAN / ED COURSE       70 year old female with  past medical history of hypertension, hyperlipidemia, COPD, and paroxysmal tachycardia who presents to the ED complaining of palpitations at home.  Patient has an at home monitor and was able to display for me EKG readings from her 81 that show multiple runs of atrial fibrillation with RVR.  Patient states she had continuous symptoms for a couple of hours, longer than she has ever had in the past.  She is now in normal sinus rhythm with no ischemic changes, labs are reassuring without electrolyte abnormality or elevated troponin.  Case discussed with Dr. Mayford Knife of cardiology, who recommends starting patient on Eliquis given her CHA2DS2-VASc score of 4.  We will also start her on 25 mg oral metoprolol as needed for recurrent palpitations.  Dr. Tenny Craw states that patient is appropriate for discharge home with close cardiology follow-up, patient was counseled to  return to the ED for new worsening symptoms.  Patient agrees with plan.      ____________________________________________   FINAL CLINICAL IMPRESSION(S) / ED DIAGNOSES  Final diagnoses:  Palpitations  Paroxysmal atrial fibrillation [facility])     ED Discharge Orders         Ordered    apixaban (ELIQUIS) 5 MG TABS tablet  2 times daily        05/24/20 1858    metoprolol tartrate (LOPRESSOR) 25 MG tablet  2 times daily PRN        05/24/20 1858           Note:  This document was prepared using Dragon voice recognition software and may include unintentional dictation errors.   Chesley Noon, MD 05/24/20 1901

## 2020-05-24 NOTE — ED Triage Notes (Signed)
Pt to ER via [facility] from home. Pt has hx of afib and felt that she was in afib for approx 4 hours today. Reports HR as high as 168. Took 30mg  cardizem PTA. Pt denies CP or SHOB.

## 2020-05-24 NOTE — Telephone Encounter (Signed)
Call  From [facility] ED Pt presented with tachycardia today   ED physician with Lourena Simmonds strips that are consistent with atrial fibrillatoin   Episode lasted seveal hours  Labs unremarkable  Pt in SR  Recomm:  Check TSH Would recomm Eliquis 5 bid  Metoprolol 25 mg prn for tachycardia  Pt will need appt in next several days to review strips, possibly set up with a new monitor   Forward to clinic in [place] to set up.

## 2020-05-24 NOTE — ED Notes (Signed)
Patient transported to X-ray 

## 2020-05-25 ENCOUNTER — Telehealth: Payer: Self-pay

## 2020-05-25 NOTE — Telephone Encounter (Signed)
error 

## 2020-05-25 NOTE — Telephone Encounter (Signed)
Attempted to schedule.  LMOV to call office.  ° °

## 2020-05-25 NOTE — Telephone Encounter (Signed)
Pt started on Eliquis and metoprolol prior to discharge from ED.  Will need TSH ordered at follow up appt.  Scheduling attempting to contact Pt.

## 2020-05-30 NOTE — Progress Notes (Signed)
Cardiology Office Note    Date:  05/31/2020   ID:  Carrie Meyers, DOB 1950-04-12, MRN [id number]  PCP:  Shane Crutch, PA  Cardiologist:  Julien Nordmann, MD  Electrophysiologist:  None   Chief Complaint: ED follow up  History of Present Illness:   Carrie Meyers is a 70 y.o. female with history of recently diagnosed PAF as outlined below, SVT, aortic atherosclerosis, hypertension, hyperlipidemia, COPD, and tobacco use who presents for ED follow-up.  She was evaluated by Dr. Mariah Milling on 01/30/2020 at the request of her PCP for concerns of A. fib.  Note indicates in 12/2019 her Kardia mobile device appreciated an irregular rhythm.  Patient at that time had a "funny feeling across chest."  She noted episodes of palpitations going back years and was previously told these were panic attacks.  Outpatient cardiac monitoring showed a predominant rhythm of sinus with an average heart rate of 85 bpm (range 60 to 207 bpm) 1 run of NSVT lasting 8 beats, 26 episodes of SVT with the fastest interval lasting 5 beats with a maximum rate of 270 bpm and the longest interval lasting 17 beats with an average rate of 126 bpm.  Some episodes of SVT were possibly atrial tachycardia with variable AV block.  Rare isolated PACs, atrial couplets, atrial triplets, PVCs, ventricular couplets, and ventricular triplets.  Patient triggered event was not associated with significant arrhythmia.  She was seen in the ED on 05/24/2020 with palpitations and reports of her Kardia mobile device indicating she was in A. fib with ventricular rates ranging from 140 to 160 bpm.  She took a dose of her husband's Cardizem and by the time EMS arrived symptoms were improved.  EKG showed sinus rhythm with no acute ST-T changes.  Labs were unrevealing as outlined below.  Thte ED physician reviewed the patient's Kardia mobile strips, which note indicates were concerning for A. fib with RVR.  Her case was discussed with Dr. Tenny Craw, of cardiology,  who recommended starting the patient on Eliquis given a CHA2DS2-VASc of 4, as well as PRN metoprolol for palpitations.    She comes in doing reasonably well from a cardiac perspective.  She notes on 05/24/2020 she had an approximate 2-hour episode of tachypalpitations that felt different than her prior episodes.  This was associated with some dizziness without syncope.  No worsening dyspnea or chest pain.  As outlined above, her Kardia mobile device showed A. fib with RVR prompting the above evaluation in the ED.  Since that episode she has not had any further tachypalpitations.  She has not yet started Eliquis as she is scheduled to have a steroid injection on 4/8.  She will start Eliquis thereafter.  She has not needed any as needed metoprolol.  No falls in the past 12 months.  No hematochezia, melena, hemoptysis, hematemesis, or hematuria.  Labs independently reviewed: 04/2018 - magnesium 2.3, Hgb 12.2, PLT 349, potassium 4.2, BUN 17, serum creatinine 0.75  Past Medical History:  Diagnosis Date  . A-fib (HCC)   . Abdominal bloating   . Atherosclerosis of native coronary artery   . Depression   . Fatigue   . Generalized anxiety disorder   . Hip pain, bilateral   . History of emphysema   . Hyperlipidemia   . Hypertension   . Panic attacks   . Pruritus of scalp   . Right-sided back pain   . Skin lesion     Past Surgical History:  Procedure Laterality Date  . APPENDECTOMY    .  BREAST BIOPSY Left 2000   negative  . BREAST EXCISIONAL BIOPSY Left 2000   negative  . c section    . NECK SURGERY      Current Medications: Current Meds  Medication Sig  . cetirizine (ZYRTEC) 10 MG tablet Take 10 mg by mouth daily.  . irbesartan (AVAPRO) 150 MG tablet Take 150 mg by mouth daily.  Marland Kitchen LORazepam (ATIVAN) 0.5 MG tablet Take 1 tablet by mouth daily as needed.  . pantoprazole (PROTONIX) 20 MG tablet Take 20 mg by mouth daily.  . rosuvastatin (CRESTOR) 10 MG tablet Take 10 mg by mouth daily.     Allergies:   Patient has no known allergies.   Social History   Socioeconomic History  . Marital status: Married    Spouse name: Not on file  . Number of children: Not on file  . Years of education: Not on file  . Highest education level: Not on file  Occupational History  . Not on file  Tobacco Use  . Smoking status: Current Every Day Smoker    Packs/day: 0.75    Years: 45.00    Pack years: 33.75    Types: Cigarettes  . Smokeless tobacco: Never Used  Vaping Use  . Vaping Use: Never used  Substance and Sexual Activity  . Alcohol use: Never  . Drug use: Never  . Sexual activity: Not on file  Other Topics Concern  . Not on file  Social History Narrative  . Not on file   Social Determinants of Health   Financial Resource Strain: Not on file  Food Insecurity: Not on file  Transportation Needs: Not on file  Physical Activity: Not on file  Stress: Not on file  Social Connections: Not on file     Family History:  The patient's family history is negative for Breast cancer.  ROS:   Review of Systems  Constitutional: Positive for malaise/fatigue. Negative for chills, diaphoresis, fever and weight loss.  HENT: Negative for congestion.   Eyes: Negative for discharge and redness.  Respiratory: Negative for cough, hemoptysis, sputum production, shortness of breath and wheezing.   Cardiovascular: Positive for palpitations. Negative for chest pain, orthopnea, claudication, leg swelling and PND.  Gastrointestinal: Negative for abdominal pain, blood in stool, heartburn, melena, nausea and vomiting.  Genitourinary: Negative for hematuria.  Musculoskeletal: Negative for falls and myalgias.  Skin: Negative for rash.  Neurological: Negative for dizziness, tingling, tremors, sensory change, speech change, focal weakness, loss of consciousness and weakness.  Endo/Heme/Allergies: Does not bruise/bleed easily.  Psychiatric/Behavioral: Negative for substance abuse. The patient is  not nervous/anxious.   All other systems reviewed and are negative.    EKGs/Labs/Other Studies Reviewed:    Studies reviewed were summarized above. The additional studies were reviewed today:  Zio patch 01/2020: Normal sinus rhythm min HR of 60 bpm, max HR of 207 bpm, and avg HR of 85 bpm.  1 run of Ventricular Tachycardia occurred lasting 8 beats with a max rate of 146 bpm (avg 129 bpm).   26 Supraventricular Tachycardia runs occurred, the run with the fastest interval lasting 5 beats with a max rate of 207 bpm, the longest lasting 17 beats with an avg rate of 126 bpm.  Some episodes of Supraventricular Tachycardia may be possible Atrial Tachycardia with variable block. Isolated SVEs were rare (<1.0%), SVE Couplets were rare (<1.0%), and SVE Triplets were rare (<1.0%). Isolated VEs were rare (<1.0%, 152), VE Couplets were rare (<1.0%, 7), and VE Triplets were rare (<  1.0%, 3).  1 patient triggered event, not associated with significant arrhythmia   EKG:  EKG is ordered today.  The EKG ordered today demonstrates NSR, 89 bpm, nonspecific ST-T changes  Recent Labs: 05/24/2020: BUN 17; Creatinine, Ser 0.75; Hemoglobin 12.2; Magnesium 2.3; Platelets 349; Potassium 4.2; Sodium 140  Recent Lipid Panel No results found for: CHOL, TRIG, HDL, CHOLHDL, VLDL, LDLCALC, LDLDIRECT  PHYSICAL EXAM:    VS:  BP 110/70 (BP Location: Left Arm, Patient Position: Sitting, Cuff Size: Normal)   Pulse 89   Ht 5\' 2"  (1.575 m)   Wt 97 lb (44 kg)   SpO2 94%   BMI 17.74 kg/m   BMI: Body mass index is 17.74 kg/m.  Physical Exam Vitals reviewed.  Constitutional:      Appearance: She is well-developed.  HENT:     Head: Normocephalic and atraumatic.  Eyes:     General:        Right eye: No discharge.        Left eye: No discharge.  Neck:     Vascular: No JVD.  Cardiovascular:     Rate and Rhythm: Normal rate and regular rhythm.     Pulses: No midsystolic click and no opening snap.           Posterior tibial pulses are 2+ on the right side and 2+ on the left side.     Heart sounds: Normal heart sounds, S1 normal and S2 normal. Heart sounds not distant. No murmur heard. No friction rub.  Pulmonary:     Effort: Pulmonary effort is normal. No respiratory distress.     Breath sounds: Normal breath sounds. No decreased breath sounds, wheezing or rales.  Chest:     Chest wall: No tenderness.  Abdominal:     General: There is no distension.     Palpations: Abdomen is soft.     Tenderness: There is no abdominal tenderness.  Musculoskeletal:     Cervical back: Normal range of motion.  Skin:    General: Skin is warm and dry.     Nails: There is no clubbing.  Neurological:     Mental Status: She is alert and oriented to person, place, and time.  Psychiatric:        Speech: Speech normal.        Behavior: Behavior normal.        Thought Content: Thought content normal.        Judgment: Judgment normal.     Wt Readings from Last 3 Encounters:  05/31/20 97 lb (44 kg)  05/24/20 100 lb (45.4 kg)  01/30/20 99 lb (44.9 kg)     ASSESSMENT & PLAN:   1. PAF: Maintaining sinus rhythm.  Kardia mobile strips were reviewed from 05/24/2020 which does confirm A. fib with RVR.  We will place a ZIO XT to quantify A. fib burden.  If she is found to have recurrent A. fib would consider referral to EP at the request.  For now, she will continue Lopressor 25 mg twice daily as needed sustained tachypalpitations.  Based on outpatient cardiac monitoring results we may end up having her take this on a daily basis.  Given a CHA2DS2-VASc at least 4 (HTN, age x1, vascular disease, sex category), it was recommended she start Eliquis in the ED after her case was reviewed with the on-call cardiologist.  She does not meet reduced dosing criteria at this time.  She has not yet started Eliquis given she is scheduled to  have a steroid injection on 4/8.  She will start Eliquis thereafter when it is safe to do so.   Check TSH.  Recent potassium and magnesium at goal.  Check echo.  Repeat BMP and CBC in follow-up on anticoagulation.  2. SVT: Noted on prior Zio patch monitoring.  As needed metoprolol as outlined above for now.  3. HTN: Blood pressure is well controlled in the office today.  Continue irbesartan.  4. Aortic atherosclerosis/HLD: No lipid panel on file for review with goal LDL recommended to be less than 70 with aortic atherosclerosis noted.  She remains on Crestor which is followed by her PCP.  5. COPD with tobacco use: No active exacerbation.  Follow-up with PCP as directed.  Complete cessation of tobacco is recommended.  This was not discussed in detail at today's visit given lengthy discussion regarding newly diagnosed A. fib.  Disposition: F/u with Dr. Mariah MillingGollan or an APP in 1 month.   Medication Adjustments/Labs and Tests Ordered: Current medicines are reviewed at length with the patient today.  Concerns regarding medicines are outlined above. Medication changes, Labs and Tests ordered today are summarized above and listed in the Patient Instructions accessible in Encounters.   Signed, Eula Listenyan Breyonna Nault, PA-C 05/31/2020 12:43 PM     [facility] [place] [address] [place], [place] [address] [phone]

## 2020-05-31 ENCOUNTER — Other Ambulatory Visit: Payer: Self-pay

## 2020-05-31 ENCOUNTER — Encounter: Payer: Self-pay | Admitting: Physician Assistant

## 2020-05-31 ENCOUNTER — Ambulatory Visit (INDEPENDENT_AMBULATORY_CARE_PROVIDER_SITE_OTHER): Payer: Managed Care, Other (non HMO) | Admitting: Physician Assistant

## 2020-05-31 ENCOUNTER — Ambulatory Visit (INDEPENDENT_AMBULATORY_CARE_PROVIDER_SITE_OTHER): Payer: Managed Care, Other (non HMO)

## 2020-05-31 VITALS — BP 110/70 | HR 89 | Ht 62.0 in | Wt 97.0 lb

## 2020-05-31 DIAGNOSIS — Z720 Tobacco use: Secondary | ICD-10-CM

## 2020-05-31 DIAGNOSIS — I10 Essential (primary) hypertension: Secondary | ICD-10-CM

## 2020-05-31 DIAGNOSIS — I480 Paroxysmal atrial fibrillation: Secondary | ICD-10-CM | POA: Diagnosis not present

## 2020-05-31 DIAGNOSIS — J432 Centrilobular emphysema: Secondary | ICD-10-CM

## 2020-05-31 DIAGNOSIS — I471 Supraventricular tachycardia: Secondary | ICD-10-CM

## 2020-05-31 DIAGNOSIS — E785 Hyperlipidemia, unspecified: Secondary | ICD-10-CM

## 2020-05-31 DIAGNOSIS — I700 Atherosclerosis of aorta: Secondary | ICD-10-CM | POA: Diagnosis not present

## 2020-05-31 NOTE — Patient Instructions (Addendum)
Medication Instructions:  No changes  *If you need a refill on your cardiac medications before your next appointment, please call your pharmacy*   Lab Work: TSH done today  If you have labs (blood work) drawn today and your tests are completely normal, you will receive your results only by: Marland Kitchen MyChart Message (if you have MyChart) OR . A paper copy in the mail If you have any lab test that is abnormal or we need to change your treatment, we will call you to review the results.   Testing/Procedures: Your physician has requested that you have an echocardiogram. Echocardiography is a painless test that uses sound waves to create images of your heart. It provides your doctor with information about the size and shape of your heart and how well your heart's chambers and valves are working. This procedure takes approximately one hour. There are no restrictions for this procedure.    Your physician has recommended that you wear a Zio monitor. This monitor is a medical device that records the heart's electrical activity. Doctors most often use these monitors to diagnose arrhythmias. Arrhythmias are problems with the speed or rhythm of the heartbeat. The monitor is a small device applied to your chest. You can wear one while you do your normal daily activities. While wearing this monitor if you have any symptoms to push the button and record what you felt. Once you have worn this monitor for the period of time provider prescribed (Usually 14 days), you will return the monitor device in the postage paid box. Once it is returned they will download the data collected and provide [place] with a report which the provider will then review and we will call you with those results. Important tips:  1. Avoid showering during the first 24 hours of wearing the monitor. 2. Avoid excessive sweating to help maximize wear time. 3. Do not submerge the device, no hot tubs, and no swimming pools. 4. Keep any lotions or oils  away from the patch. 5. After 24 hours you may shower with the patch on. Take brief showers with your back facing the shower head.  6. Do not remove patch once it has been placed because that will interrupt data and decrease adhesive wear time. 7. Push the button when you have any symptoms and write down what you were feeling. 8. Once you have completed wearing your monitor, remove and place into box which has postage paid and place in your outgoing mailbox.  9. If for some reason you have misplaced your box then call our office and we can provide another box and/or mail it off for you.         Follow-Up: At [facility], you and your health needs are our priority.  As part of our continuing mission to provide you with exceptional heart care, we have created designated Provider Care Teams.  These Care Teams include your primary Cardiologist (physician) and Advanced Practice Providers (APPs -  Physician Assistants and Nurse Practitioners) who all work together to provide you with the care you need, when you need it.   Your next appointment:   1 month(s)  The format for your next appointment:   In Person  Provider:   Julien Nordmann, MD or Eula Listen, PA-C

## 2020-06-01 LAB — TSH: TSH: 0.88 u[IU]/mL (ref 0.450–4.500)

## 2020-06-26 ENCOUNTER — Other Ambulatory Visit: Payer: Self-pay | Admitting: Pediatrics

## 2020-06-26 DIAGNOSIS — R053 Chronic cough: Secondary | ICD-10-CM

## 2020-06-27 ENCOUNTER — Ambulatory Visit (INDEPENDENT_AMBULATORY_CARE_PROVIDER_SITE_OTHER): Payer: Managed Care, Other (non HMO)

## 2020-06-27 ENCOUNTER — Other Ambulatory Visit: Payer: Self-pay

## 2020-06-27 DIAGNOSIS — I471 Supraventricular tachycardia: Secondary | ICD-10-CM | POA: Diagnosis not present

## 2020-06-27 DIAGNOSIS — I480 Paroxysmal atrial fibrillation: Secondary | ICD-10-CM | POA: Diagnosis not present

## 2020-06-27 LAB — ECHOCARDIOGRAM COMPLETE
AR max vel: 2.43 cm2
AV Area VTI: 2.74 cm2
AV Area mean vel: 2.66 cm2
AV Mean grad: 2 mmHg
AV Peak grad: 4.2 mmHg
Ao pk vel: 1.03 m/s
Area-P 1/2: 2.32 cm2
Calc EF: 55.3 %
S' Lateral: 2.1 cm
Single Plane A2C EF: 56.8 %
Single Plane A4C EF: 52.7 %

## 2020-06-28 ENCOUNTER — Ambulatory Visit
Admission: RE | Admit: 2020-06-28 | Discharge: 2020-06-28 | Disposition: A | Discharge status: Home / Self Care | Payer: Managed Care, Other (non HMO) | Attending: Pediatrics | Admitting: Pediatrics

## 2020-06-28 ENCOUNTER — Ambulatory Visit
Admission: RE | Admit: 2020-06-28 | Discharge: 2020-06-28 | Disposition: A | Discharge status: Home / Self Care | Payer: Managed Care, Other (non HMO) | Source: Ambulatory Visit | Attending: Pediatrics | Admitting: Pediatrics

## 2020-06-28 ENCOUNTER — Other Ambulatory Visit: Payer: Self-pay

## 2020-06-28 DIAGNOSIS — R0609 Other forms of dyspnea: Secondary | ICD-10-CM | POA: Insufficient documentation

## 2020-06-28 DIAGNOSIS — R053 Chronic cough: Secondary | ICD-10-CM

## 2020-06-28 IMAGING — CR DG CHEST 2V
2 series · 2 of 2 positions shown · non-contrast
Comparison: [DATE]

CLINICAL DATA: Chronic cough

EXAM:
CHEST - 2 VIEW

[chest pa]
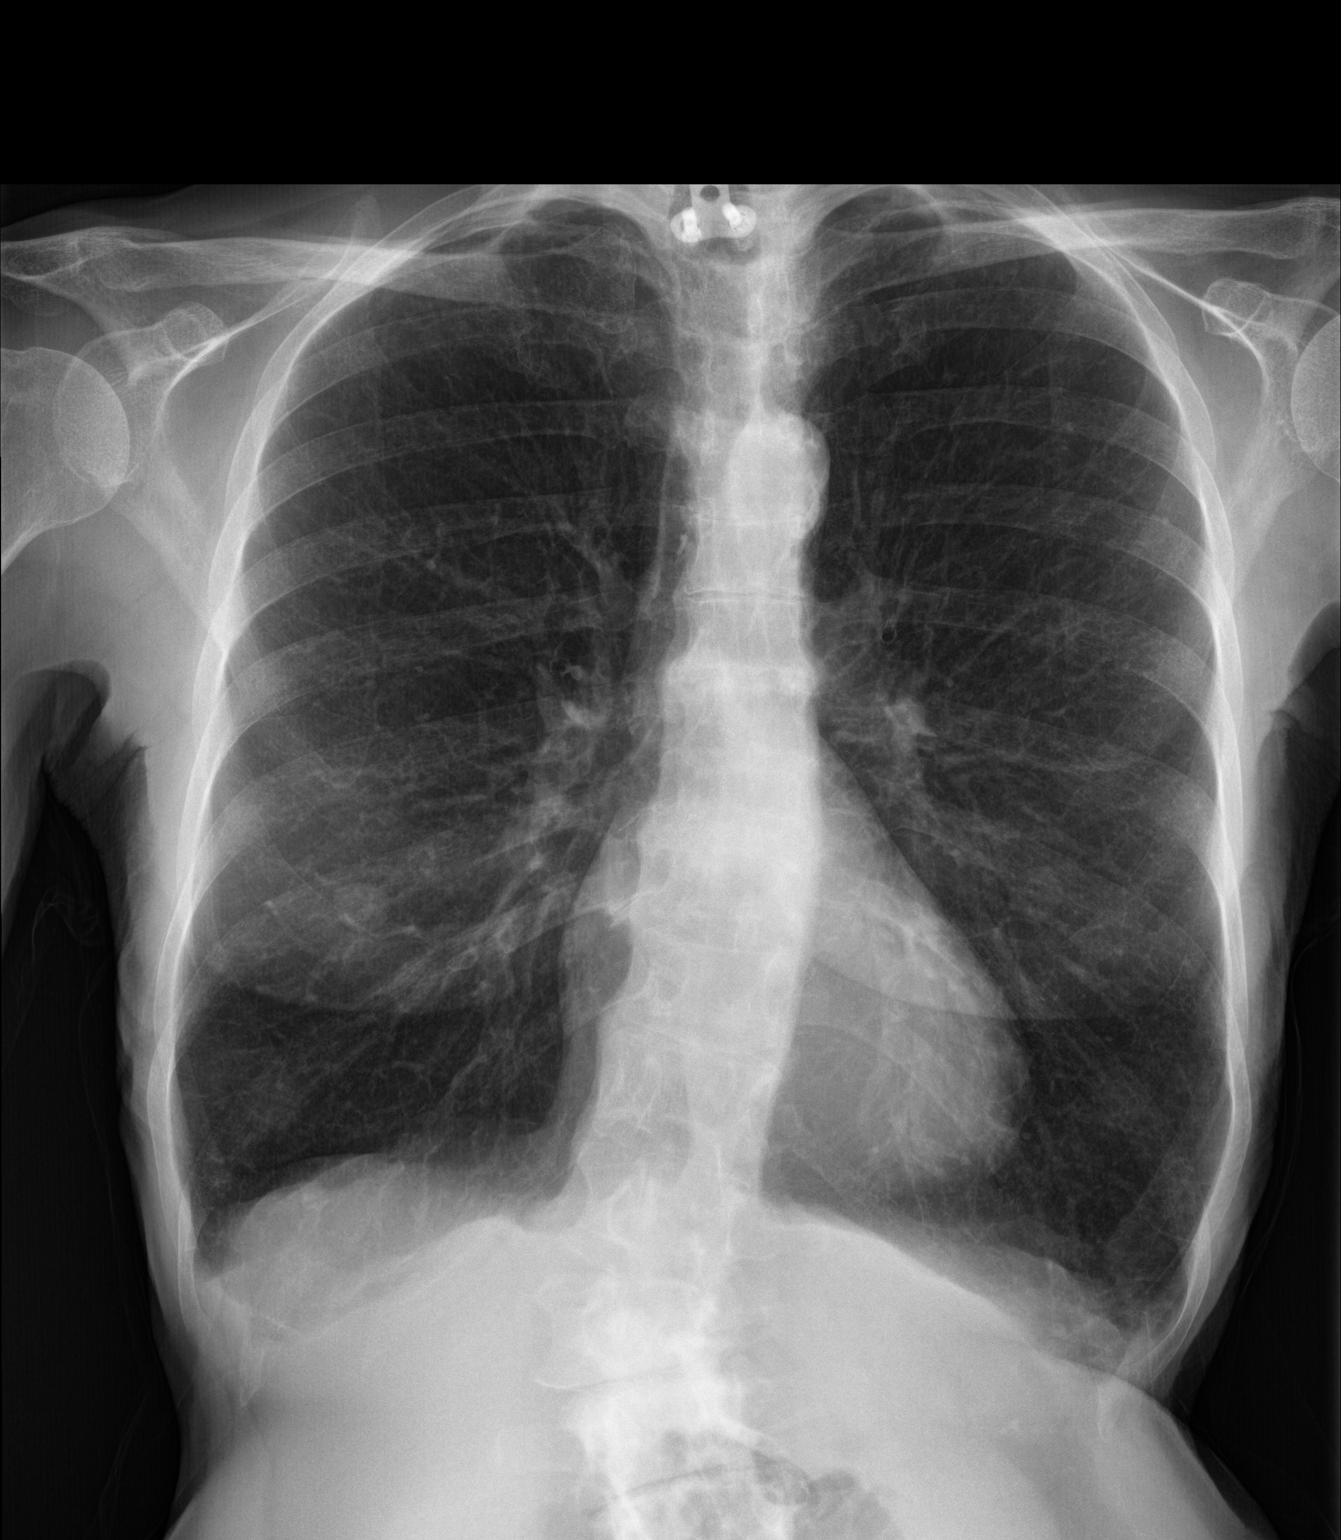

[chest lat]
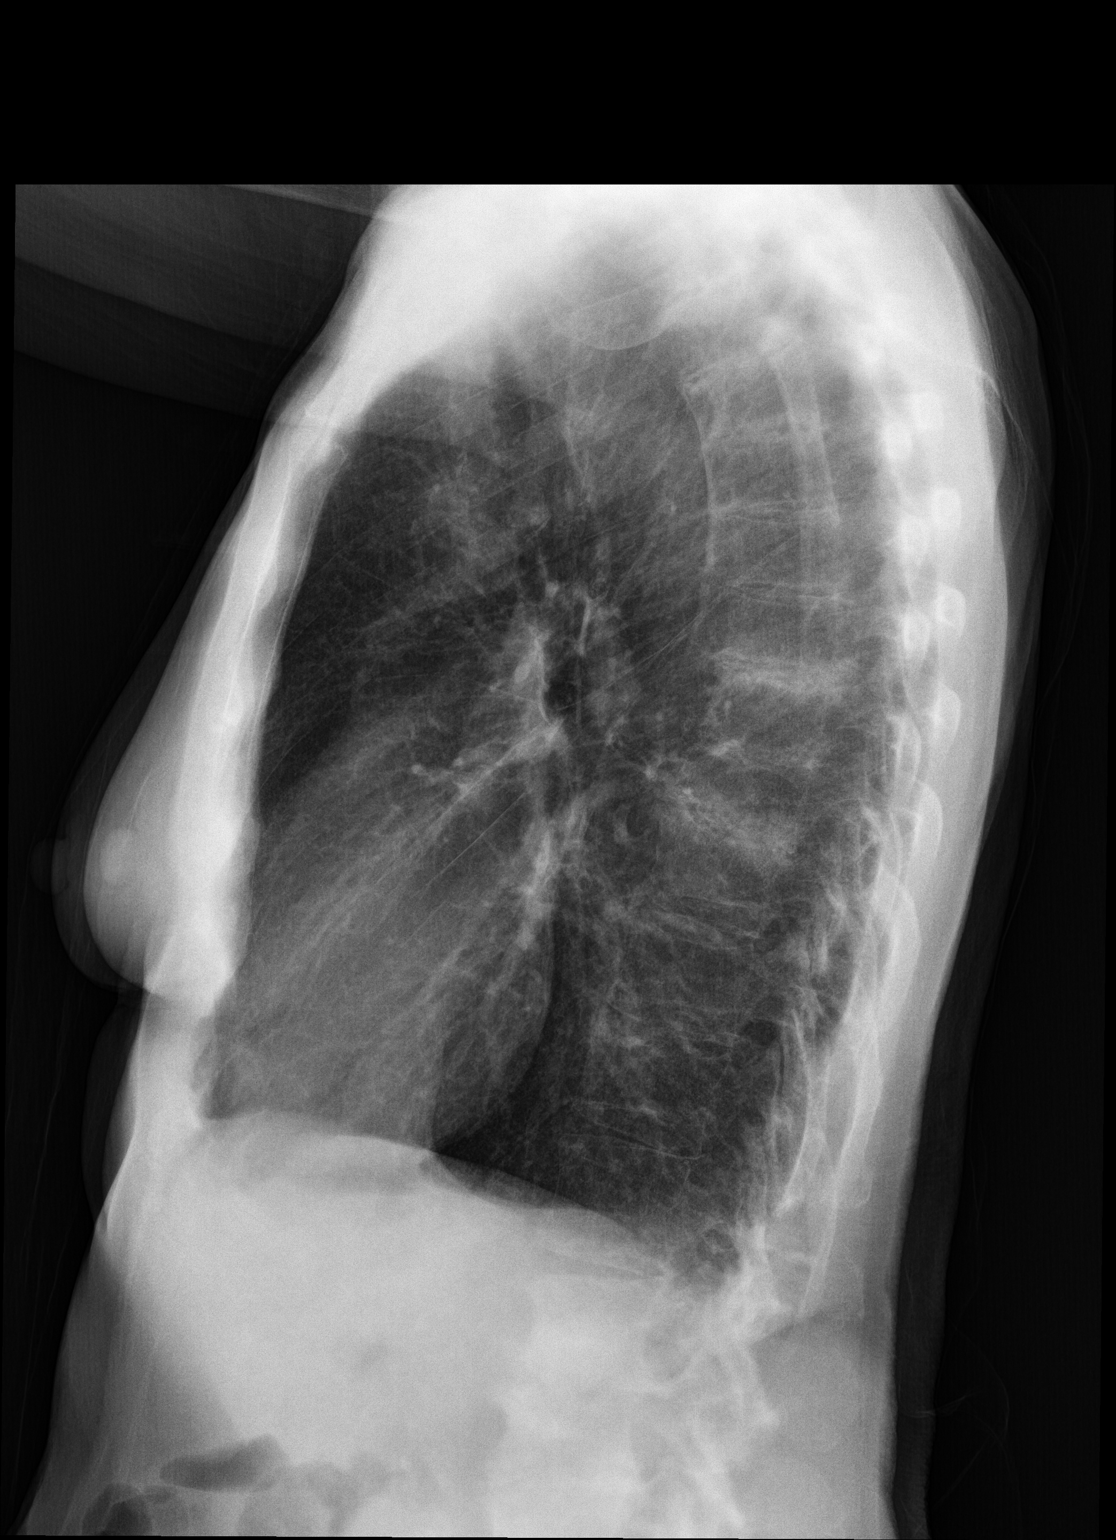

[2 of 2 positions shown; findings below may reference images not displayed]

FINDINGS: Lungs are hyperinflated as can be seen with COPD. No focal
consolidation. No pleural effusion or pneumothorax. Heart and
mediastinal contours are unremarkable.

No acute osseous abnormality.
IMPRESSION: No active cardiopulmonary disease.

## 2020-06-29 NOTE — Progress Notes (Signed)
Cardiology Office Note    Date:  07/05/2020   ID:  Carrie Meyers, DOB 1950/10/30, MRN [id number]  PCP:  Shane CrutchMarkley, Linda, PA  Cardiologist:  Julien Nordmannimothy Gollan, MD  Electrophysiologist:  None   Chief Complaint: Follow-up  History of Present Illness:   Carrie Meyers is a 70 y.o. female with history of recently diagnosed PAF as outlined below, SVT, aortic atherosclerosis, hypertension, hyperlipidemia, COPD, and tobacco use who presents for follow-up of A. fib, echo, and Zio patch.  She was evaluated by Dr. Mariah MillingGollan on 01/30/2020 at the request of her PCP for concerns of A. fib.  Note indicates in 12/2019 her Kardia mobile device appreciated an irregular rhythm.  Patient at that time had a "funny feeling across chest."  She noted episodes of palpitations going back years and was previously told these were panic attacks.  Outpatient cardiac monitoring showed a predominant rhythm of sinus with an average heart rate of 85 bpm (range 60 to 207 bpm) 1 run of NSVT lasting 8 beats, 26 episodes of SVT with the fastest interval lasting 5 beats with a maximum rate of 270 bpm and the longest interval lasting 17 beats with an average rate of 126 bpm.  Some episodes of SVT were possibly atrial tachycardia with variable AV block.  Rare isolated PACs, atrial couplets, atrial triplets, PVCs, ventricular couplets, and ventricular triplets.  Patient triggered event was not associated with significant arrhythmia.  She was seen in the ED on 05/24/2020 with palpitations and reports of her Kardia mobile device indicating she was in A. fib with ventricular rates ranging from 140 to 160 bpm.  She took a dose of her husband's Cardizem and by the time EMS arrived symptoms were improved.  EKG showed sinus rhythm with no acute ST-T changes.  Labs were unrevealing as outlined below.  The ED physician reviewed the patient's Kardia mobile strips, which note indicates were concerning for A. fib with RVR.  Her case was discussed  with Dr. Tenny Crawoss, of cardiology, who recommended starting the patient on Eliquis given a CHA2DS2-VASc of 4, as well as PRN metoprolol for palpitations.    She was seen in ED follow-up on 05/31/2020 and was doing well from a cardiac perspective.  She noted on 05/24/2020 she had an approximate 2-hour episode of tachypalpitations that felt different than her prior episodes.  She had not yet started Eliquis as she was scheduled to have a steroid injection on 4/8 with plans to start Eliquis thereafter.  She had not needed any as needed metoprolol.  Kardia mobile strips were reviewed from 05/24/2020 which did confirm A. fib with RVR.  ZIO XT was placed to quantify A. fib burden which demonstrated a predominant rhythm of sinus with an average heart rate of 85 bpm (range 59 to 203 bpm), 1 run of NSVT lasting 6 beats with a maximum rate of 139 bpm, 22 episodes of SVT with the fastest interval lasting 5 beats with a maximum rate of 203 bpm and the longest interval lasting 7.7 seconds with an average rate of 142 bpm.  Some episodes of SVT may have been atrial tachycardia with variable AV block.  A. fib was noted with a less than 1% burden with ventricular rates ranging from 109 to 156 bpm.  The longest episode lasted 6 minutes and 36 seconds.  A. fib was detected with symptomatic patient events.  Echo on 06/27/2020 showed an EF of 60 to 65%, no regional wall motion abnormalities, normal LV diastolic function parameters, normal  RV systolic function and ventricular cavity size, no significant valvular abnormalities, and an estimated right atrial pressure of 3 mmHg.  She comes in doing reasonably well from a cardiac perspective.  Since she was last seen she did have several episodes of tachypalpitations similar to her prior documented episodes of A. fib which would typically last 4 to 6 minutes.  However, on 5/10 she did have an episode of tachypalpitations that was consistent with her prior A. fib which persisted for greater than 2  hours.  She has needed to take an occasional as needed metoprolol and ultimately ended up needing to take metoprolol x2 on 5/10 for palpitations to improve.  No chest pain, worsening dyspnea, dizziness, presyncope, or syncope.  No falls.  She did have an isolated episode of hematochezia while having a BM on 5/10, with subsequent BMs being normal.  No melena, hemoptysis, hematemesis, or hematuria.  She is tolerating Eliquis twice daily without issues.  She does continue to note underlying fatigue and cold intolerance.  She is working on tapering her tobacco and has been started on Trelegy since we last saw her.   Labs independently reviewed: 05/2020 - TSH normal 04/2020 - magnesium 2.3, Hgb 12.2, PLT 349, potassium 4.2, BUN 17, serum creatinine 0.75    Past Medical History:  Diagnosis Date  . A-fib (HCC)   . Abdominal bloating   . Atherosclerosis of native coronary artery   . Depression   . Fatigue   . Generalized anxiety disorder   . Hip pain, bilateral   . History of emphysema   . Hyperlipidemia   . Hypertension   . Panic attacks   . Pruritus of scalp   . Right-sided back pain   . Skin lesion     Past Surgical History:  Procedure Laterality Date  . APPENDECTOMY    . BREAST BIOPSY Left 2000   negative  . BREAST EXCISIONAL BIOPSY Left 2000   negative  . c section    . NECK SURGERY      Current Medications: Current Meds  Medication Sig  . apixaban (ELIQUIS) 5 MG TABS tablet Take 1 tablet (5 mg total) by mouth 2 (two) times daily.  . cetirizine (ZYRTEC) 10 MG tablet Take 10 mg by mouth daily.  . irbesartan (AVAPRO) 150 MG tablet Take 150 mg by mouth daily.  Marland Kitchen LORazepam (ATIVAN) 0.5 MG tablet Take 1 tablet by mouth daily as needed.  . pantoprazole (PROTONIX) 20 MG tablet Take 20 mg by mouth daily.  . rosuvastatin (CRESTOR) 10 MG tablet Take 10 mg by mouth daily.  . TRELEGY ELLIPTA 100-62.5-25 MCG/INH AEPB Inhale 1 puff into the lungs daily.  . [DISCONTINUED] metoprolol  tartrate (LOPRESSOR) 25 MG tablet Take 1 tablet (25 mg total) by mouth 2 (two) times daily as needed (Palpitations).    Allergies:   Patient has no known allergies.   Social History   Socioeconomic History  . Marital status: Married    Spouse name: Not on file  . Number of children: Not on file  . Years of education: Not on file  . Highest education level: Not on file  Occupational History  . Not on file  Tobacco Use  . Smoking status: Current Every Day Smoker    Packs/day: 0.50    Years: 45.00    Pack years: 22.50    Types: Cigarettes  . Smokeless tobacco: Never Used  Vaping Use  . Vaping Use: Never used  Substance and Sexual Activity  .  Alcohol use: Never  . Drug use: Never  . Sexual activity: Not on file  Other Topics Concern  . Not on file  Social History Narrative  . Not on file   Social Determinants of Health   Financial Resource Strain: Not on file  Food Insecurity: Not on file  Transportation Needs: Not on file  Physical Activity: Not on file  Stress: Not on file  Social Connections: Not on file     Family History:  The patient's family history is negative for Breast cancer.  ROS:   Review of Systems  Constitutional: Positive for malaise/fatigue. Negative for chills, diaphoresis, fever and weight loss.  HENT: Negative for congestion.   Eyes: Negative for discharge and redness.  Respiratory: Positive for cough and shortness of breath. Negative for hemoptysis, sputum production and wheezing.        Improved shortness of breath and cough  Cardiovascular: Negative for chest pain, palpitations, orthopnea, claudication, leg swelling and PND.  Gastrointestinal: Positive for blood in stool. Negative for abdominal pain, heartburn, melena, nausea and vomiting.  Genitourinary: Negative for hematuria.  Musculoskeletal: Negative for falls and myalgias.  Skin: Negative for rash.  Neurological: Positive for weakness. Negative for dizziness, tingling, tremors,  sensory change, speech change, focal weakness and loss of consciousness.  Endo/Heme/Allergies: Does not bruise/bleed easily.       Cold intolerance  Psychiatric/Behavioral: Negative for substance abuse. The patient is not nervous/anxious.   All other systems reviewed and are negative.    EKGs/Labs/Other Studies Reviewed:    Studies reviewed were summarized above. The additional studies were reviewed today:  Zio patch 01/2020: Normal sinus rhythm min HR of 60 bpm, max HR of 207 bpm, and avg HR of 85 bpm.  1 run of Ventricular Tachycardia occurred lasting 8 beats with a max rate of 146 bpm (avg 129 bpm).   26 Supraventricular Tachycardia runs occurred, the run with the fastest interval lasting 5 beats with a max rate of 207 bpm, the longest lasting 17 beats with an avg rate of 126 bpm.  Some episodes of Supraventricular Tachycardia may be possible Atrial Tachycardia with variable block. Isolated SVEs were rare (<1.0%), SVE Couplets were rare (<1.0%), and SVE Triplets were rare (<1.0%). Isolated VEs were rare (<1.0%, 152), VE Couplets were rare (<1.0%, 7), and VE Triplets were rare (<1.0%, 3).  1 patient triggered event, not associated with significant arrhythmia __________  Zio patch 05/2020: Min HR of 59 bpm, max HR of 203 bpm, and avg HR of 85 bpm. Predominant underlying rhythm was sinus rhythm.   1 run of Ventricular Tachycardia occurred lasting 6 beats with a max rate of 139 bpm (avg 132 bpm).   22 Supraventricular Tachycardia runs occurred, the run with the fastest interval lasting 5 beats with a max rate of 203 bpm, the longest lasting 7.7 secs with an avg rate of 142 bpm. Some episodes of supraventricular tachycardia may be possible atrial tachycardia with variable block.   Atrial fibrillation occurred (<1% burden), ranging from 109-156 bpm (avg of 132 bpm), the longest lasting 6 mins 36 secs with an avg rate of 132 bpm. Atrial fibrillation was detected within +/- 45 seconds  of symptomatic patient event(s).  Isolated SVEs were rare (<1.0%), SVE couplets were rare (<1.0%), and SVE triplets were rare (<1.0%). Isolated VEs were rare (<1.0%), VE couplets were rare (<1.0%), and no VE triplets were present. __________  2D echo 06/27/2020: 1. Left ventricular ejection fraction, by estimation, is 60 to 65%. The  left ventricle has normal function. The left ventricle has no regional  wall motion abnormalities. Left ventricular diastolic parameters were  normal.  2. Right ventricular systolic function is normal. The right ventricular  size is normal.  3. The mitral valve is normal in structure. No evidence of mitral valve  regurgitation.  4. The aortic valve was not well visualized. Aortic valve regurgitation  is not visualized.  5. The inferior vena cava is normal in size with greater than 50%  respiratory variability, suggesting right atrial pressure of 3 mmHg.   EKG:  EKG is ordered today.  The EKG ordered today demonstrates NSR, 85 bpm, no acute ST-T changes  Recent Labs: 05/24/2020: BUN 17; Creatinine, Ser 0.75; Hemoglobin 12.2; Magnesium 2.3; Platelets 349; Potassium 4.2; Sodium 140 05/31/2020: TSH 0.880  Recent Lipid Panel No results found for: CHOL, TRIG, HDL, CHOLHDL, VLDL, LDLCALC, LDLDIRECT  PHYSICAL EXAM:    VS:  BP (!) 142/70 (BP Location: Left Arm, Patient Position: Sitting, Cuff Size: Normal)   Pulse 85   Ht 5\' 2"  (1.575 m)   Wt 94 lb (42.6 kg)   SpO2 98%   BMI 17.19 kg/m   BMI: Body mass index is 17.19 kg/m.  Physical Exam Vitals reviewed.  Constitutional:      Appearance: She is well-developed.  HENT:     Head: Normocephalic and atraumatic.  Eyes:     General:        Right eye: No discharge.        Left eye: No discharge.  Neck:     Vascular: No JVD.  Cardiovascular:     Rate and Rhythm: Normal rate and regular rhythm.     Pulses: No midsystolic click and no opening snap.          Posterior tibial pulses are 2+ on the right  side and 2+ on the left side.     Heart sounds: Normal heart sounds, S1 normal and S2 normal. Heart sounds not distant. No murmur heard. No friction rub.  Pulmonary:     Effort: Pulmonary effort is normal. No respiratory distress.     Breath sounds: Normal breath sounds. No decreased breath sounds, wheezing or rales.  Chest:     Chest wall: No tenderness.  Abdominal:     General: There is no distension.     Palpations: Abdomen is soft.     Tenderness: There is no abdominal tenderness.  Musculoskeletal:     Cervical back: Normal range of motion.  Skin:    General: Skin is warm and dry.     Nails: There is no clubbing.  Neurological:     Mental Status: She is alert and oriented to person, place, and time.  Psychiatric:        Speech: Speech normal.        Behavior: Behavior normal.        Thought Content: Thought content normal.        Judgment: Judgment normal.     Wt Readings from Last 3 Encounters:  07/05/20 94 lb (42.6 kg)  05/31/20 97 lb (44 kg)  05/24/20 100 lb (45.4 kg)     ASSESSMENT & PLAN:   1. PAF: Maintaining sinus rhythm.  Start Lopressor 25 mg twice daily for rate control.  Given a CHA2DS2-VASc at least 4 (HTN, age x1, vascular disease, sex category) she remains on Eliquis 5 mg twice daily (does not meet reduced dosing criteria at this time).  Recent echo and laboratory evaluation including  TSH, potassium, and magnesium have been unrevealing.  Recent CBC demonstrated normal hemoglobin.  If she notes persistent profound fatigue following initiation of metoprolol we could consider a trial of carvedilol or Cardizem CD for rate control strategy.  2. SVT: Noted on prior to Zio patch monitors.  Echo and laboratory evaluation unrevealing.  Start scheduled Lopressor as outlined above.  3. HTN: Blood pressure is mildly elevated in the office today.  We will start scheduled Lopressor 25 mg twice daily as outlined above and titrate accordingly.  4. Aortic  atherosclerosis/HLD: No lipid panel on file for review with goal LDL recommended to be less than 70 with aortic atherosclerosis noted.  She remains on Crestor which is followed by her PCP.  5. COPD with tobacco use: No active exacerbation.  Started on Trelegy with improvement in cough and shortness of breath.  She is working on tapering tobacco use with complete cessation recommended.  Follow-up with PCP as directed.  6. Hematochezia: Isolated episode of BRBPR with BM on 5/10.  Subsequent BMs have been normal.  If hematochezia returns recommend she contact PCP and our office.  Recent CBC showed normal hemoglobin.  Disposition: F/u with Dr. Mariah Milling or an APP in 1 month.   Medication Adjustments/Labs and Tests Ordered: Current medicines are reviewed at length with the patient today.  Concerns regarding medicines are outlined above. Medication changes, Labs and Tests ordered today are summarized above and listed in the Patient Instructions accessible in Encounters.   SignedEula Listen, PA-C 07/05/2020 11:23 AM     [facility] [place] [address] [place], [place] [address] [phone]

## 2020-07-05 ENCOUNTER — Encounter: Payer: Self-pay | Admitting: Physician Assistant

## 2020-07-05 ENCOUNTER — Ambulatory Visit (INDEPENDENT_AMBULATORY_CARE_PROVIDER_SITE_OTHER): Payer: Managed Care, Other (non HMO) | Admitting: Physician Assistant

## 2020-07-05 ENCOUNTER — Other Ambulatory Visit: Payer: Self-pay

## 2020-07-05 VITALS — BP 142/70 | HR 85 | Ht 62.0 in | Wt 94.0 lb

## 2020-07-05 DIAGNOSIS — E785 Hyperlipidemia, unspecified: Secondary | ICD-10-CM

## 2020-07-05 DIAGNOSIS — I700 Atherosclerosis of aorta: Secondary | ICD-10-CM | POA: Diagnosis not present

## 2020-07-05 DIAGNOSIS — I471 Supraventricular tachycardia, unspecified: Secondary | ICD-10-CM

## 2020-07-05 DIAGNOSIS — J432 Centrilobular emphysema: Secondary | ICD-10-CM

## 2020-07-05 DIAGNOSIS — K921 Melena: Secondary | ICD-10-CM

## 2020-07-05 DIAGNOSIS — I480 Paroxysmal atrial fibrillation: Secondary | ICD-10-CM

## 2020-07-05 DIAGNOSIS — I10 Essential (primary) hypertension: Secondary | ICD-10-CM | POA: Diagnosis not present

## 2020-07-05 DIAGNOSIS — Z720 Tobacco use: Secondary | ICD-10-CM

## 2020-07-05 MED ORDER — METOPROLOL TARTRATE 25 MG PO TABS: 25.0000 mg | ORAL_TABLET | Freq: Two times a day (BID) | ORAL | 3 refills | Status: DC

## 2020-07-05 NOTE — Patient Instructions (Signed)
Medication Instructions:  Your physician has recommended you make the following change in your medication:   1. START Metoprolol tartrate to 25 mg twice a day  *If you need a refill on your cardiac medications before your next appointment, please call your pharmacy*   Lab Work: None  If you have labs (blood work) drawn today and your tests are completely normal, you will receive your results only by: Marland Kitchen MyChart Message (if you have MyChart) OR . A paper copy in the mail If you have any lab test that is abnormal or we need to change your treatment, we will call you to review the results.   Testing/Procedures: None   Follow-Up: At [facility], you and your health needs are our priority.  As part of our continuing mission to provide you with exceptional heart care, we have created designated Provider Care Teams.  These Care Teams include your primary Cardiologist (physician) and Advanced Practice Providers (APPs -  Physician Assistants and Nurse Practitioners) who all work together to provide you with the care you need, when you need it.   Your next appointment:   1 month(s)  The format for your next appointment:   In Person  Provider:   You may see Julien Nordmann, MD or one of the following Advanced Practice Providers on your designated Care Team:    Nicolasa Ducking, NP  Eula Listen, PA-C  Marisue Ivan, PA-C  Cadence [place], [place]  Gillian Shields, NP

## 2020-08-06 ENCOUNTER — Other Ambulatory Visit: Payer: Self-pay

## 2020-08-06 MED ORDER — APIXABAN 5 MG PO TABS: 5.0000 mg | ORAL_TABLET | Freq: Two times a day (BID) | ORAL | 6 refills | Status: DC

## 2020-08-07 NOTE — Progress Notes (Deleted)
Cardiology Office Note    Date:  08/07/2020   ID:  Carrie Meyers, DOB 1950-05-07, MRN [id number]  PCP:  Shane Crutch, PA  Cardiologist:  Julien Nordmann, MD  Electrophysiologist:  None   Chief Complaint: Follow up  History of Present Illness:   Carrie Meyers is a 70 y.o. female with history of recently diagnosed PAF as outlined below, SVT, aortic atherosclerosis, hypertension, hyperlipidemia, COPD, and tobacco use who presents for follow-up of A. fib.   She was evaluated by Dr. Mariah Milling on 01/30/2020 at the request of her PCP for concerns of A. fib.  Note indicates in 12/2019 her Kardia mobile device appreciated an irregular rhythm.  Patient at that time had a "funny feeling across chest."  She noted episodes of palpitations going back years and was previously told these were panic attacks.   Outpatient cardiac monitoring showed a predominant rhythm of sinus with an average heart rate of 85 bpm (range 60 to 207 bpm) 1 run of NSVT lasting 8 beats, 26 episodes of SVT with the fastest interval lasting 5 beats with a maximum rate of 270 bpm and the longest interval lasting 17 beats with an average rate of 126 bpm.  Some episodes of SVT were possibly atrial tachycardia with variable AV block.  Rare isolated PACs, atrial couplets, atrial triplets, PVCs, ventricular couplets, and ventricular triplets.  Patient triggered event was not associated with significant arrhythmia.   She was seen in the ED on 05/24/2020 with palpitations and reports of her Kardia mobile device indicating she was in A. fib with ventricular rates ranging from 140 to 160 bpm.  She took a dose of her husband's Cardizem and by the time EMS arrived symptoms were improved.  EKG showed sinus rhythm with no acute ST-T changes.  Labs were unrevealing as outlined below.  The ED physician reviewed the patient's Kardia mobile strips, which note indicates were concerning for A. fib with RVR.  Her case was discussed with Dr. Tenny Craw, of  cardiology, who recommended starting the patient on Eliquis given a CHA2DS2-VASc of 4, as well as PRN metoprolol for palpitations.     She was seen in ED follow-up on 05/31/2020 and was doing well from a cardiac perspective.  She noted on 05/24/2020 she had an approximate 2-hour episode of tachypalpitations that felt different than her prior episodes.  She had not yet started Eliquis as she was scheduled to have a steroid injection on 4/8 with plans to start Eliquis thereafter.  She had not needed any as needed metoprolol.  Kardia mobile strips were reviewed from 05/24/2020 which did confirm A. fib with RVR.  ZIO XT was placed to quantify A. fib burden which demonstrated a predominant rhythm of sinus with an average heart rate of 85 bpm (range 59 to 203 bpm), 1 run of NSVT lasting 6 beats with a maximum rate of 139 bpm, 22 episodes of SVT with the fastest interval lasting 5 beats with a maximum rate of 203 bpm and the longest interval lasting 7.7 seconds with an average rate of 142 bpm.  Some episodes of SVT may have been atrial tachycardia with variable AV block.  A. fib was noted with a less than 1% burden with ventricular rates ranging from 109 to 156 bpm.  The longest episode lasted 6 minutes and 36 seconds.  A. fib was detected with symptomatic patient events.  Echo on 06/27/2020 showed an EF of 60 to 65%, no regional wall motion abnormalities, normal LV diastolic function parameters,  normal RV systolic function and ventricular cavity size, no significant valvular abnormalities, and an estimated right atrial pressure of 3 mmHg.  She was last seen on 07/05/2020 and was doing reasonably well from a cardiac perspective.  She did report several episodes of tachypalpitations similar to her prior documented episodes of A. fib which would typically last 4 to 6 minutes.  She did report 1 episode that lasted greater than 2 hours.  She reported an isolated episode of hematochezia.  Given palpitations, it was recommended she  start Lopressor 25 mg twice daily.     ***     Labs independently reviewed: 05/2020 - TSH normal 04/2020 - magnesium 2.3, Hgb 12.2, PLT 349, potassium 4.2, BUN 17, serum creatinine 0.75    Past Medical History:  Diagnosis Date   A-fib (HCC)    Abdominal bloating    Atherosclerosis of native coronary artery    Depression    Fatigue    Generalized anxiety disorder    Hip pain, bilateral    History of emphysema    Hyperlipidemia    Hypertension    Panic attacks    Pruritus of scalp    Right-sided back pain    Skin lesion     Past Surgical History:  Procedure Laterality Date   APPENDECTOMY     BREAST BIOPSY Left 2000   negative   BREAST EXCISIONAL BIOPSY Left 2000   negative   c section     NECK SURGERY      Current Medications: No outpatient medications have been marked as taking for the 08/14/20 encounter (Appointment) with Sondra Barges, PA-C.    Allergies:   Patient has no known allergies.   Social History   Socioeconomic History   Marital status: Married    Spouse name: Not on file   Number of children: Not on file   Years of education: Not on file   Highest education level: Not on file  Occupational History   Not on file  Tobacco Use   Smoking status: Every Day    Packs/day: 0.50    Years: 45.00    Pack years: 22.50    Types: Cigarettes   Smokeless tobacco: Never  Vaping Use   Vaping Use: Never used  Substance and Sexual Activity   Alcohol use: Never   Drug use: Never   Sexual activity: Not on file  Other Topics Concern   Not on file  Social History Narrative   Not on file   Social Determinants of Health   Financial Resource Strain: Not on file  Food Insecurity: Not on file  Transportation Needs: Not on file  Physical Activity: Not on file  Stress: Not on file  Social Connections: Not on file     Family History:  The patient's family history is negative for Breast cancer.  ROS:   ROS   EKGs/Labs/Other Studies Reviewed:     Studies reviewed were summarized above. The additional studies were reviewed today:  Zio patch 01/2020: Normal sinus rhythm min HR of 60 bpm, max HR of 207 bpm, and avg HR of 85 bpm.   1 run of Ventricular Tachycardia occurred lasting 8 beats with a max rate of 146 bpm (avg 129 bpm).   26 Supraventricular Tachycardia runs occurred, the run with the fastest interval lasting 5 beats with a max rate of 207 bpm, the longest lasting 17 beats with an avg rate of 126 bpm.   Some episodes of Supraventricular Tachycardia may be possible  Atrial Tachycardia with variable block. Isolated SVEs were rare (<1.0%), SVE Couplets were rare (<1.0%), and SVE Triplets were rare (<1.0%). Isolated VEs were rare (<1.0%, 152), VE Couplets were rare (<1.0%, 7), and VE Triplets were rare (<1.0%, 3).   1 patient triggered event, not associated with significant arrhythmia __________   Zio patch 05/2020: Min HR of 59 bpm, max HR of 203 bpm, and avg HR of 85 bpm. Predominant underlying rhythm was sinus rhythm.   1 run of Ventricular Tachycardia occurred lasting 6 beats with a max rate of 139 bpm (avg 132 bpm).   22 Supraventricular Tachycardia runs occurred, the run with the fastest interval lasting 5 beats with a max rate of 203 bpm, the longest lasting 7.7 secs with an avg rate of 142 bpm. Some episodes of supraventricular tachycardia may be possible atrial tachycardia with variable block.   Atrial fibrillation occurred (<1% burden), ranging from 109-156 bpm (avg of 132 bpm), the longest lasting 6 mins 36 secs with an avg rate of 132 bpm. Atrial fibrillation was detected within +/- 45 seconds of symptomatic patient event(s).   Isolated SVEs were rare (<1.0%), SVE couplets were rare (<1.0%), and SVE triplets were rare (<1.0%). Isolated VEs were rare (<1.0%), VE couplets were rare (<1.0%), and no VE triplets were present. __________   2D echo 06/27/2020: 1. Left ventricular ejection fraction, by estimation, is 60 to  65%. The  left ventricle has normal function. The left ventricle has no regional  wall motion abnormalities. Left ventricular diastolic parameters were  normal.   2. Right ventricular systolic function is normal. The right ventricular  size is normal.   3. The mitral valve is normal in structure. No evidence of mitral valve  regurgitation.   4. The aortic valve was not well visualized. Aortic valve regurgitation  is not visualized.   5. The inferior vena cava is normal in size with greater than 50%  respiratory variability, suggesting right atrial pressure of 3 mmHg.   EKG:  EKG is ordered today.  The EKG ordered today demonstrates ***  Recent Labs: 05/24/2020: BUN 17; Creatinine, Ser 0.75; Hemoglobin 12.2; Magnesium 2.3; Platelets 349; Potassium 4.2; Sodium 140 05/31/2020: TSH 0.880  Recent Lipid Panel No results found for: CHOL, TRIG, HDL, CHOLHDL, VLDL, LDLCALC, LDLDIRECT  PHYSICAL EXAM:    VS:  There were no vitals taken for this visit.  BMI: There is no height or weight on file to calculate BMI.  Physical Exam  Wt Readings from Last 3 Encounters:  07/05/20 94 lb (42.6 kg)  05/31/20 97 lb (44 kg)  05/24/20 100 lb (45.4 kg)     ASSESSMENT & PLAN:   PAF:  SVT:  HTN: Blood pressure ***  Aortic atherosclerosis/HLD:  COPD with tobacco use:  Hematochezia:  Disposition: F/u with Dr. Mariah Milling or an APP in ***.   Medication Adjustments/Labs and Tests Ordered: Current medicines are reviewed at length with the patient today.  Concerns regarding medicines are outlined above. Medication changes, Labs and Tests ordered today are summarized above and listed in the Patient Instructions accessible in Encounters.   Signed, Eula Listen, PA-C 08/07/2020 4:21 PM     [facility] [address] [place], [place] [address] [phone]

## 2020-08-14 ENCOUNTER — Ambulatory Visit: Payer: Medicare Other | Admitting: Physician Assistant

## 2020-08-22 ENCOUNTER — Ambulatory Visit (INDEPENDENT_AMBULATORY_CARE_PROVIDER_SITE_OTHER): Payer: Managed Care, Other (non HMO) | Admitting: Cardiovascular Disease

## 2020-08-22 ENCOUNTER — Other Ambulatory Visit: Payer: Self-pay

## 2020-08-22 ENCOUNTER — Encounter: Payer: Self-pay | Admitting: Cardiovascular Disease

## 2020-08-22 VITALS — BP 122/60 | HR 56 | Ht 62.5 in | Wt 93.1 lb

## 2020-08-22 DIAGNOSIS — Z720 Tobacco use: Secondary | ICD-10-CM

## 2020-08-22 DIAGNOSIS — I10 Essential (primary) hypertension: Secondary | ICD-10-CM | POA: Diagnosis not present

## 2020-08-22 DIAGNOSIS — I471 Supraventricular tachycardia: Secondary | ICD-10-CM | POA: Diagnosis not present

## 2020-08-22 DIAGNOSIS — I480 Paroxysmal atrial fibrillation: Secondary | ICD-10-CM | POA: Diagnosis not present

## 2020-08-22 DIAGNOSIS — E785 Hyperlipidemia, unspecified: Secondary | ICD-10-CM

## 2020-08-22 DIAGNOSIS — I700 Atherosclerosis of aorta: Secondary | ICD-10-CM | POA: Diagnosis not present

## 2020-08-22 DIAGNOSIS — J432 Centrilobular emphysema: Secondary | ICD-10-CM

## 2020-08-22 NOTE — Progress Notes (Signed)
Cardiology Office Note  Date:  08/22/2020   ID:  Carrie Meyers, DOB [birth year], MRN [id number]  PCP:  Shane Crutch, PA   Chief Complaint  Patient presents with   1 month follow up     Patient c/o fluttering in chest at times. Medications reviewed by the patient verbally.     HPI:  [name] is a 70 year old woman with past medical history of Smoker Aortic atherosclerosis on CT scan Emphysema on CT scan Who presents for follow-up of her cardiac arrhythmia, possible atrial fibrillation  Last seen by myself in clinic December 2021 Seen by one of our providers May 2022 ZIO monitor showing atrial fibrillation, also rare short runs of narrow complex tachycardia  Echocardiogram May 2022 normal ejection fraction 60%  Still smoking, <1/2 ppd  On metoprolol 25 twice daily On Eliquis 5 twice daily  No breakthrough arrhythmia Broken sleep, chronic fatigue  Upset stomach, Weight declining  Denies any significant chest pain on exertion, better on inhaler  Tolerating Crestor, ARB  CT chest:01/2019   Aortic atherosclerosis (ICD10-170.0). Emphysema (ICD10-J43.9).   PMH:   has a past medical history of A-fib (HCC), Abdominal bloating, Atherosclerosis of native coronary artery, Depression, Fatigue, Generalized anxiety disorder, Hip pain, bilateral, History of emphysema, Hyperlipidemia, Hypertension, Panic attacks, Pruritus of scalp, Right-sided back pain, and Skin lesion.  PSH:    Past Surgical History:  Procedure Laterality Date   APPENDECTOMY     BREAST BIOPSY Left 2000   negative   BREAST EXCISIONAL BIOPSY Left 2000   negative   c section     NECK SURGERY      Current Outpatient Medications  Medication Sig Dispense Refill   apixaban (ELIQUIS) 5 MG TABS tablet Take 1 tablet (5 mg total) by mouth 2 (two) times daily. 60 tablet 6   cetirizine (ZYRTEC) 10 MG tablet Take 10 mg by mouth daily.     irbesartan (AVAPRO) 150 MG tablet Take 150 mg by mouth daily.      LORazepam (ATIVAN) 0.5 MG tablet Take 1 tablet by mouth daily as needed.     metoprolol tartrate (LOPRESSOR) 25 MG tablet Take 1 tablet (25 mg total) by mouth 2 (two) times daily. 180 tablet 3   pantoprazole (PROTONIX) 20 MG tablet Take 20 mg by mouth daily.     rosuvastatin (CRESTOR) 10 MG tablet Take 10 mg by mouth daily.     TRELEGY ELLIPTA 100-62.5-25 MCG/INH AEPB Inhale 1 puff into the lungs daily.     No current facility-administered medications for this visit.     Allergies:   Patient has no known allergies.   Social History:  The patient  reports that she has been smoking cigarettes. She has a 22.50 pack-year smoking history. She has never used smokeless tobacco. She reports that she does not drink alcohol and does not use drugs.   Family History:   family history is not on file.    Review of Systems: Review of Systems  Constitutional: Negative.   HENT: Negative.    Respiratory: Negative.    Cardiovascular: Negative.   Gastrointestinal: Negative.   Musculoskeletal: Negative.   Neurological: Negative.   Psychiatric/Behavioral: Negative.    All other systems reviewed and are negative.  PHYSICAL EXAM: VS:  BP 122/60 (BP Location: Left Arm, Patient Position: Sitting, Cuff Size: Normal)   Pulse (!) 56   Ht 5' 2.5" (1.588 m)   Wt 93 lb 2 oz (42.2 kg)   SpO2 98%   BMI 16.76  kg/m  , BMI Body mass index is 16.76 kg/m. Constitutional:  oriented to person, place, and time. No distress.  HENT:  Head: Grossly normal Eyes:  no discharge. No scleral icterus.  Neck: No JVD, notable on the right today, placed on the left Cardiovascular: Regular rate and rhythm, no murmurs appreciated Pulmonary/Chest: Clear to auscultation bilaterally, no wheezes or rails Abdominal: Soft.  no distension.  no tenderness.  Musculoskeletal: Normal range of motion Neurological:  normal muscle tone. Coordination normal. No atrophy Skin: Skin warm and dry Psychiatric: normal affect,  pleasant   Recent Labs: 05/24/2020: BUN 17; Creatinine, Ser 0.75; Hemoglobin 12.2; Magnesium 2.3; Platelets 349; Potassium 4.2; Sodium 140 05/31/2020: TSH 0.880    Lipid Panel No results found for: CHOL, HDL, LDLCALC, TRIG    Wt Readings from Last 3 Encounters:  08/22/20 93 lb 2 oz (42.2 kg)  07/05/20 94 lb (42.6 kg)  05/31/20 97 lb (44 kg)      ASSESSMENT AND PLAN:  Problem List Items Addressed This Visit   None Visit Diagnoses     Paroxysmal atrial fibrillation (HCC)    -  Primary   Paroxysmal SVT (supraventricular tachycardia) (HCC)       Essential hypertension       Aortic atherosclerosis (HCC)       Hyperlipidemia LDL goal <70       Centrilobular emphysema (HCC)       Tobacco use         Paroxysmal atrial fibrillation On metoprolol, Eliquis, does not appreciate tachypalpitations No changes to medications, discussed taking extra metoprolol for breakthrough arrhythmia Discussed SVT noted on monitor  Aortic atherosclerosis Mild to moderate in the arch We have requested panel from primary care for our records  Smoker We have encouraged her to continue to work on weaning her cigarettes and smoking cessation. She will continue to work on this   Hyperlipidemia Continue Crestor given aortic atherosclerosis Goal LDL less than 70  COPD/emphysema Seen on CT scan, she is aware Not on oxygen Smoking cessation recommended  Carotid bruit Notable again today, carotid ultrasound offered and discussed, she would like to wait on testing   Total encounter time more than 25 minutes  Greater than 50% was spent in counseling and coordination of care with the patient    Signed, Dossie Arbour, M.D., Ph.D. [facility] [place], [place] [phone]

## 2020-08-22 NOTE — Patient Instructions (Addendum)
Labs from PMD  Call if you would carotid u/s, for bruit  Medication Instructions:  No changes  If you need a refill on your cardiac medications before your next appointment, please call your pharmacy.    Lab work: No new labs needed   If you have labs (blood work) drawn today and your tests are completely normal, you will receive your results only by: MyChart Message (if you have MyChart) OR A paper copy in the mail If you have any lab test that is abnormal or we need to change your treatment, we will call you to review the results.   Testing/Procedures: No new testing needed   Follow-Up: At [facility], you and your health needs are our priority.  As part of our continuing mission to provide you with exceptional heart care, we have created designated Provider Care Teams.  These Care Teams include your primary Cardiologist (physician) and Advanced Practice Providers (APPs -  Physician Assistants and Nurse Practitioners) who all work together to provide you with the care you need, when you need it.  You will need a follow up appointment in 12 months  Providers on your designated Care Team:   Nicolasa Ducking, NP Eula Listen, PA-C Marisue Ivan, PA-C  Any Other Special Instructions Will Be Listed Below (If Applicable).  COVID-19 Vaccine Information can be found at: [email or web address] For questions related to vaccine distribution or appointments, please email [email or web address] or call [phone].

## 2020-12-26 ENCOUNTER — Other Ambulatory Visit: Payer: Self-pay | Admitting: Physician Assistant

## 2021-01-21 ENCOUNTER — Emergency Department: Payer: Medicare HMO

## 2021-01-21 ENCOUNTER — Other Ambulatory Visit: Payer: Self-pay

## 2021-01-21 ENCOUNTER — Encounter: Payer: Self-pay | Admitting: Radiology

## 2021-01-21 ENCOUNTER — Ambulatory Visit
Admission: EM | Admit: 2021-01-21 | Discharge: 2021-01-21 | Disposition: A | Discharge status: Home / Self Care | Payer: Medicare HMO

## 2021-01-21 ENCOUNTER — Emergency Department
Admission: EM | Admit: 2021-01-21 | Discharge: 2021-01-21 | Disposition: A | Discharge status: Home / Self Care | Payer: Medicare HMO | Attending: Emergency Medicine | Admitting: Emergency Medicine

## 2021-01-21 DIAGNOSIS — Z7901 Long term (current) use of anticoagulants: Secondary | ICD-10-CM | POA: Diagnosis not present

## 2021-01-21 DIAGNOSIS — F17210 Nicotine dependence, cigarettes, uncomplicated: Secondary | ICD-10-CM | POA: Diagnosis not present

## 2021-01-21 DIAGNOSIS — I4891 Unspecified atrial fibrillation: Secondary | ICD-10-CM | POA: Insufficient documentation

## 2021-01-21 DIAGNOSIS — I10 Essential (primary) hypertension: Secondary | ICD-10-CM | POA: Diagnosis not present

## 2021-01-21 DIAGNOSIS — Z79899 Other long term (current) drug therapy: Secondary | ICD-10-CM | POA: Insufficient documentation

## 2021-01-21 DIAGNOSIS — K2900 Acute gastritis without bleeding: Secondary | ICD-10-CM | POA: Insufficient documentation

## 2021-01-21 DIAGNOSIS — R1012 Left upper quadrant pain: Secondary | ICD-10-CM | POA: Diagnosis present

## 2021-01-21 LAB — CBC
HCT: 26.1 % — ABNORMAL LOW (ref 36.0–46.0)
Hemoglobin: 8 g/dL — ABNORMAL LOW (ref 12.0–15.0)
MCH: 26.3 pg (ref 26.0–34.0)
MCHC: 30.7 g/dL (ref 30.0–36.0)
MCV: 85.9 fL (ref 80.0–100.0)
Platelets: 325 10*3/uL (ref 150–400)
RBC: 3.04 MIL/uL — ABNORMAL LOW (ref 3.87–5.11)
RDW: 18.6 % — ABNORMAL HIGH (ref 11.5–15.5)
WBC: 8.4 10*3/uL (ref 4.0–10.5)
nRBC: 0 % (ref 0.0–0.2)

## 2021-01-21 LAB — COMPREHENSIVE METABOLIC PANEL
ALT: 34 U/L (ref 0–44)
AST: 27 U/L (ref 15–41)
Albumin: 3.8 g/dL (ref 3.5–5.0)
Alkaline Phosphatase: 81 U/L (ref 38–126)
Anion gap: 6 (ref 5–15)
BUN: 13 mg/dL (ref 8–23)
CO2: 25 mmol/L (ref 22–32)
Calcium: 8.8 mg/dL — ABNORMAL LOW (ref 8.9–10.3)
Chloride: 104 mmol/L (ref 98–111)
Creatinine, Ser: 0.69 mg/dL (ref 0.44–1.00)
GFR, Estimated: >60 mL/min (ref >60)
Glucose, Bld: 95 mg/dL (ref 70–99)
Potassium: 4 mmol/L (ref 3.5–5.1)
Sodium: 135 mmol/L (ref 135–145)
Total Bilirubin: 0.4 mg/dL (ref 0.3–1.2)
Total Protein: 7 g/dL (ref 6.5–8.1)

## 2021-01-21 LAB — URINALYSIS, ROUTINE W REFLEX MICROSCOPIC
Bacteria, UA: NONE SEEN
Bilirubin Urine: NEGATIVE
Glucose, UA: NEGATIVE mg/dL
Ketones, ur: NEGATIVE mg/dL
Leukocytes,Ua: NEGATIVE
Nitrite: NEGATIVE
Protein, ur: NEGATIVE mg/dL
Specific Gravity, Urine: 1.025 (ref 1.005–1.030)
pH: 6.5 (ref 5.0–8.0)

## 2021-01-21 LAB — TROPONIN I (HIGH SENSITIVITY)
Troponin I (High Sensitivity): 5 ng/L (ref <18)
Troponin I (High Sensitivity): 6 ng/L (ref <18)

## 2021-01-21 LAB — PROTIME-INR
INR: 1.2 (ref 0.8–1.2)
Prothrombin Time: 15 seconds (ref 11.4–15.2)

## 2021-01-21 LAB — TYPE AND SCREEN
ABO/RH(D): O NEG
Antibody Screen: NEGATIVE

## 2021-01-21 LAB — APTT: aPTT: 36 seconds (ref 24–36)

## 2021-01-21 LAB — LIPASE, BLOOD: Lipase: 27 U/L (ref 11–51)

## 2021-01-21 IMAGING — CT CT ABD-PELV W/ CM
2 of 5 series · 17 of 46 positions shown, 19 images · IV contrast (APPLIED)
Comparison: None.

CLINICAL DATA: Epigastric pain

EXAM:
CT ABDOMEN AND PELVIS WITH CONTRAST
TECHNIQUE: Multidetector CT imaging of the abdomen and pelvis was performed
using the standard protocol following bolus administration of
intravenous contrast.
CONTRAST:  80mL OMNIPAQUE IOHEXOL 300 MG/ML  SOLN

[Series 2: routine abd/pel with · axial · 0.72mm/px · z∈[-298,+32]mm · 14 of 76 slices shown, 16 images]
[im 5/76  soft-tissue]
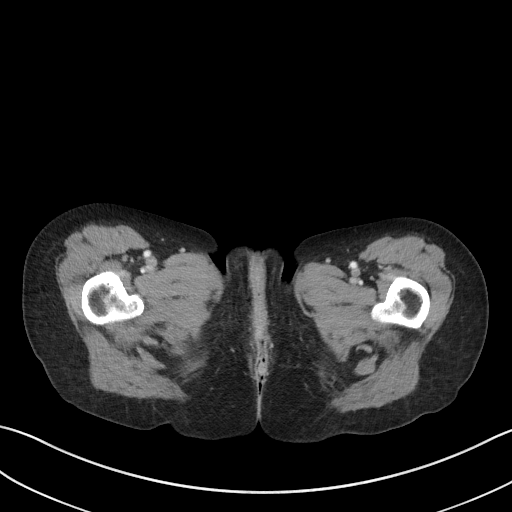
[im 5/76  bone]
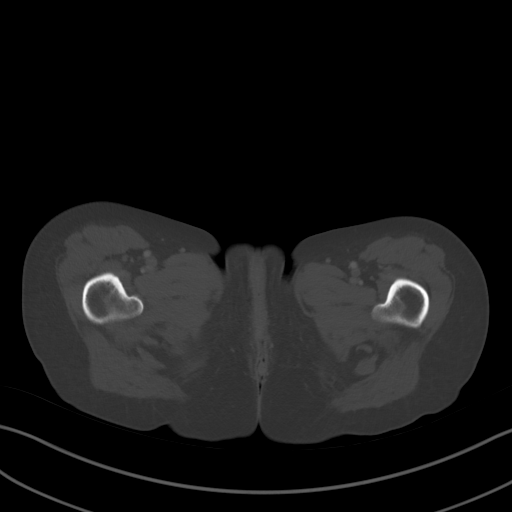
[im 9/76  soft-tissue]
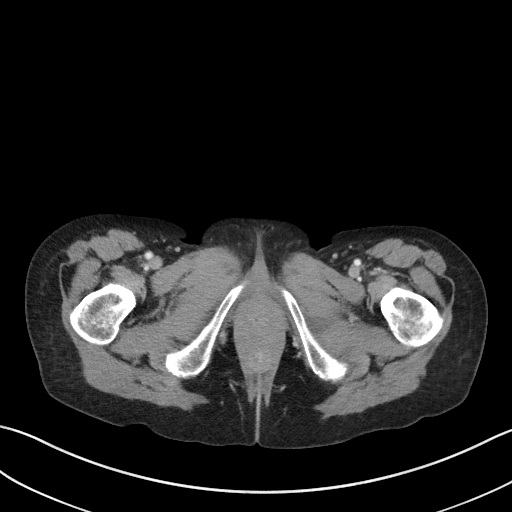
[im 17/76  soft-tissue]
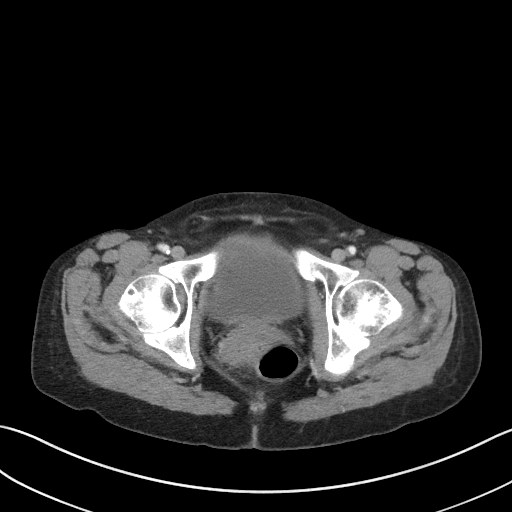
[im 21/76  soft-tissue]
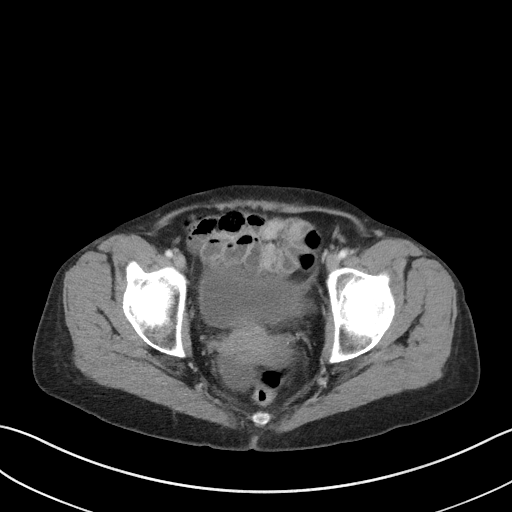
[im 26/76  soft-tissue]
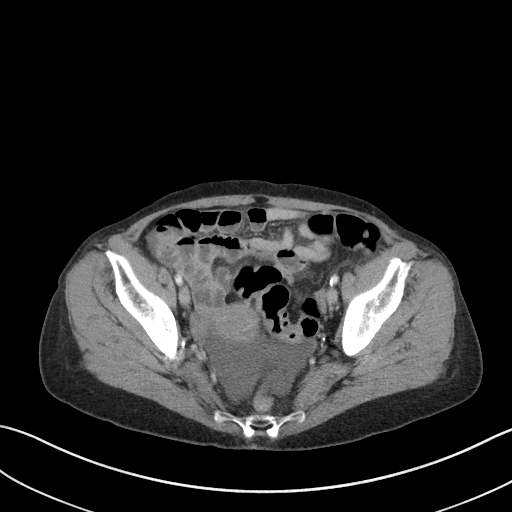
[im 30/76  soft-tissue]
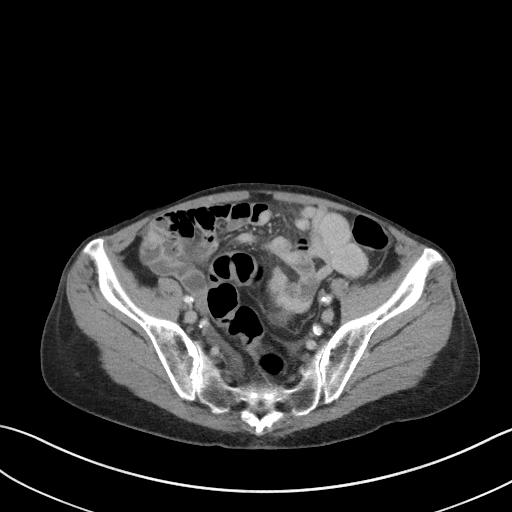
[im 34/76  soft-tissue]
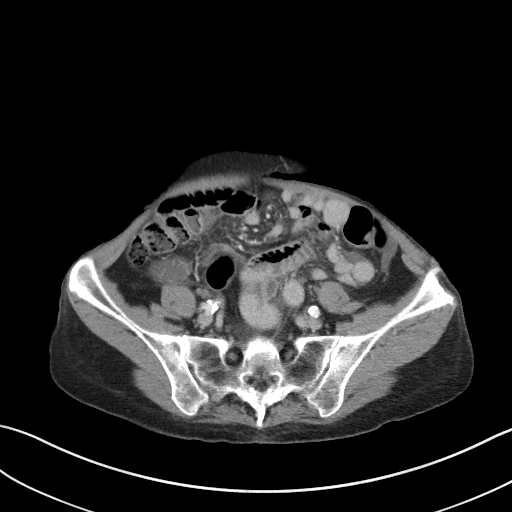
[im 42/76  soft-tissue]
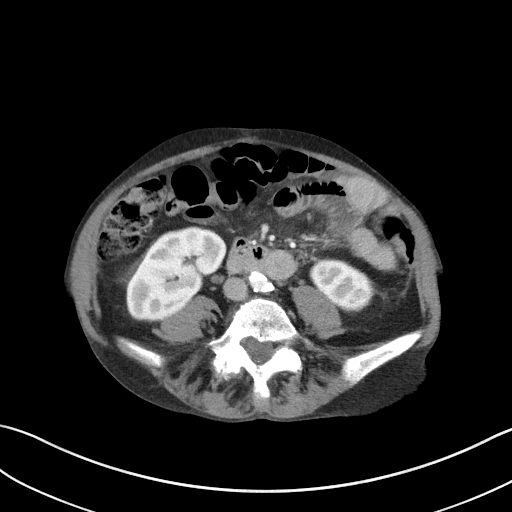
[im 46/76  soft-tissue]
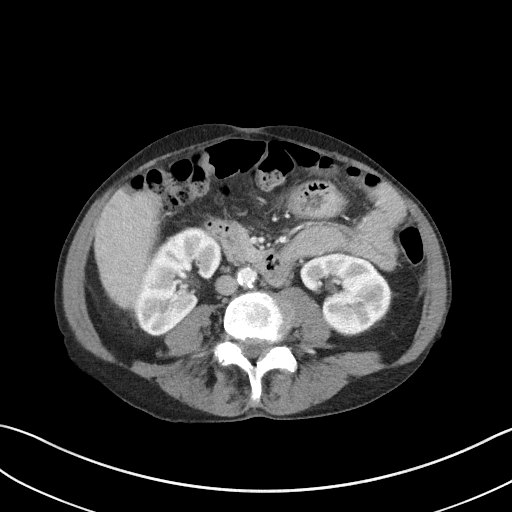
[im 46/76  bone]
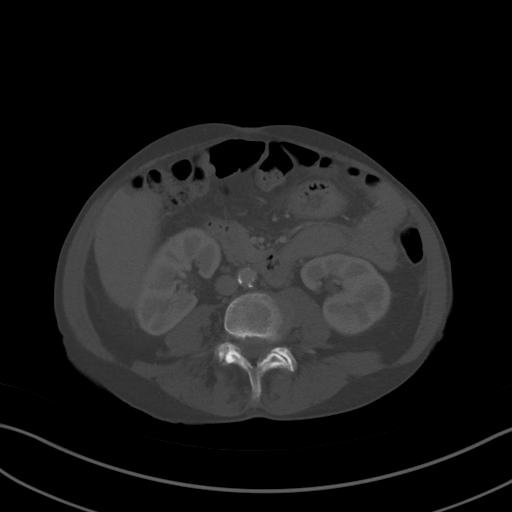
[im 51/76  soft-tissue]
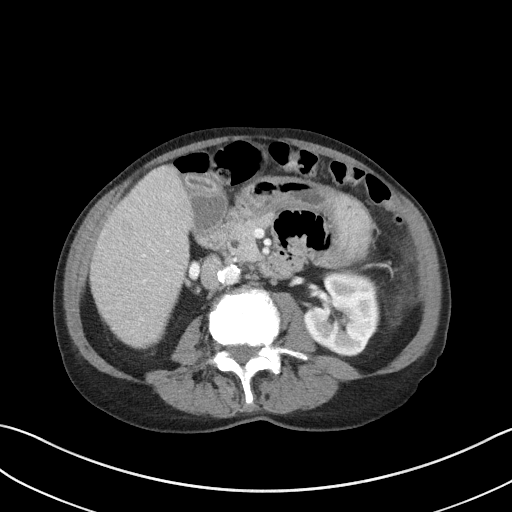
[im 55/76  soft-tissue]
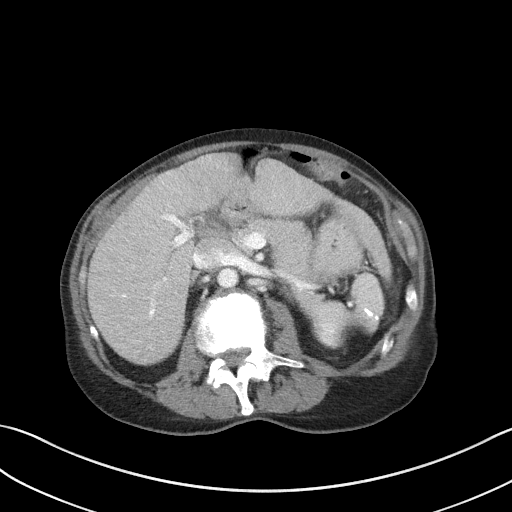
[im 59/76  soft-tissue]
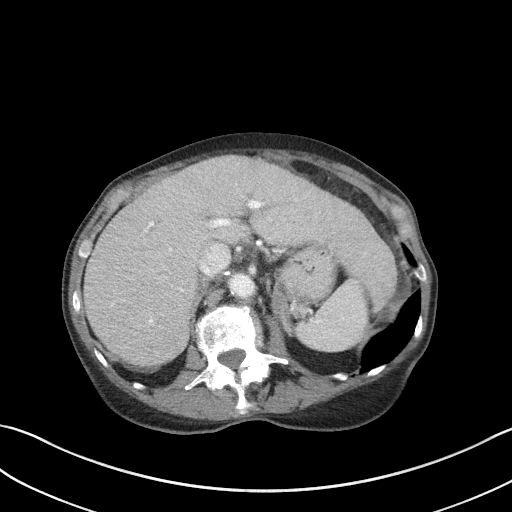
[im 67/76  soft-tissue]
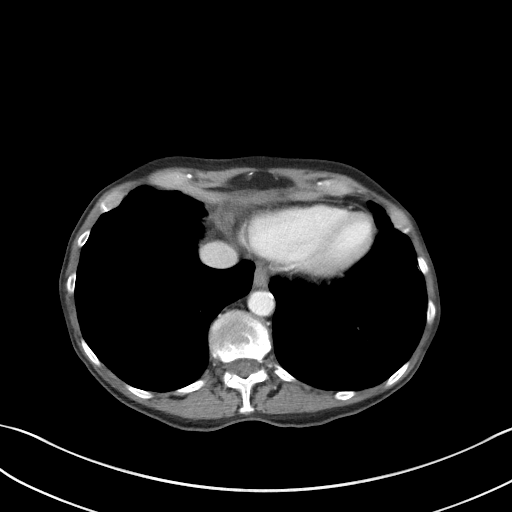
[im 71/76  soft-tissue]
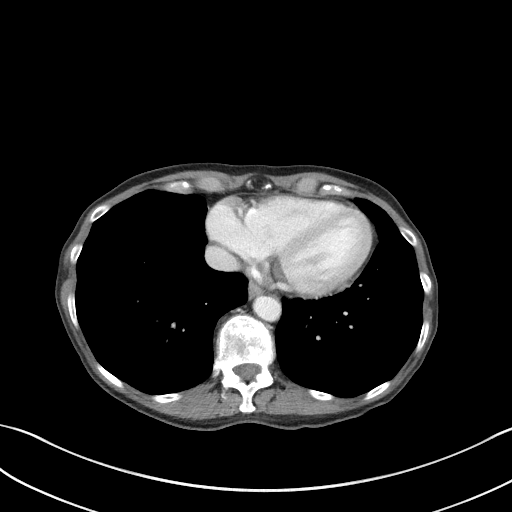

[Series 5: coronal st · coronal · 0.67mm/px · 3 of 79 slices shown]
[im 27/79  soft-tissue]
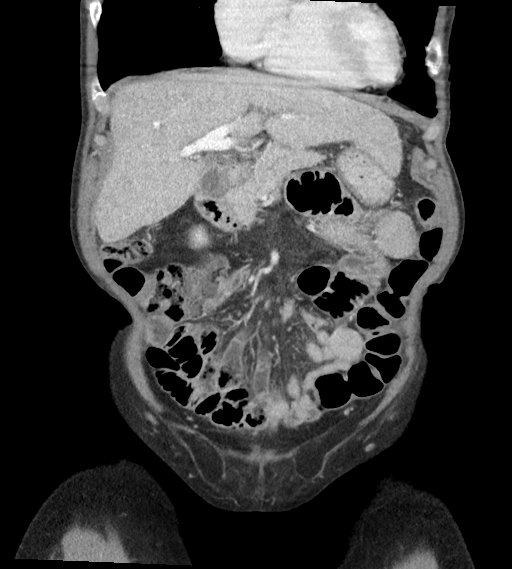
[im 35/79  soft-tissue]
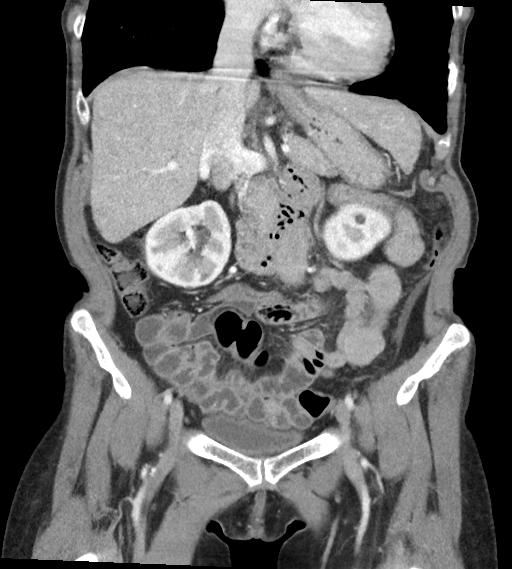
[im 44/79  soft-tissue]
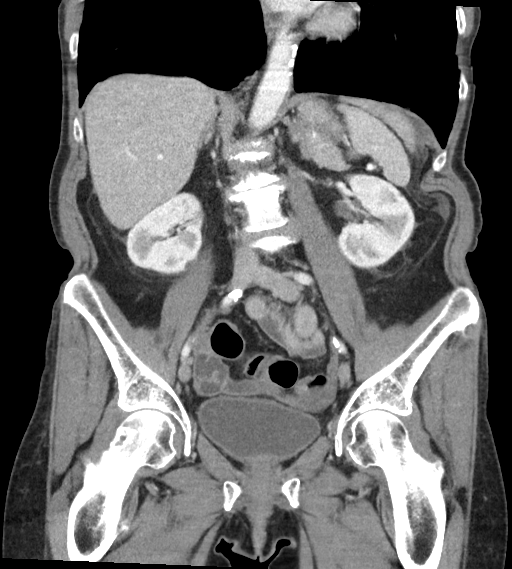

[17 of 46 positions shown; findings below may reference images not displayed]

FINDINGS: Lower chest: No acute abnormality.

Hepatobiliary: No suspicious focal liver lesions. Gallbladder is
unremarkable. No biliary ductal dilation.

Pancreas: Unremarkable. No pancreatic ductal dilatation or
surrounding inflammatory changes.

Spleen: Normal in size without focal abnormality.

Adrenals/Urinary Tract: Bilateral adrenal glands are unremarkable.
No hydronephrosis or nephrolithiasis. Bladder is unremarkable.

Stomach/Bowel: Stomach is within normal limits. Appendix is not
visualized. No evidence of bowel wall thickening, distention, or
inflammatory changes.

Vascular/Lymphatic: Aortic atherosclerosis. No enlarged abdominal or
pelvic lymph nodes.

Reproductive: Uterus and bilateral adnexa are unremarkable.

Other: Small bilateral fat containing inguinal hernias. Nonspecific
trace abdominopelvic ascites.

Musculoskeletal: Degenerative changes of the lumbar spine. No
aggressive appearing osseous lesions.
IMPRESSION: 1. Nonspecific trace abdominopelvic ascites.
2. Otherwise, no acute findings seen in the abdomen or pelvis.
3.  Severe aortic Atherosclerosis ([GK]-[GK]).

## 2021-01-21 MED ORDER — SUCRALFATE 1 G PO TABS: 1.0000 g | ORAL_TABLET | Freq: Three times a day (TID) | ORAL | 0 refills | Status: AC

## 2021-01-21 MED ORDER — IOHEXOL 300 MG/ML  SOLN
100.0000 mL | Freq: Once | INTRAMUSCULAR | Status: AC | PRN
  Administered 2021-01-21: 16:00:00 80 mL via INTRAVENOUS
  Filled 2021-01-21: qty 100

## 2021-01-21 MED ORDER — PANTOPRAZOLE SODIUM 40 MG IV SOLR
40.0000 mg | Freq: Once | INTRAVENOUS | Status: AC
  Administered 2021-01-21: 16:00:00 40 mg via INTRAVENOUS
  Filled 2021-01-21: qty 40

## 2021-01-21 NOTE — ED Provider Notes (Signed)
[facility] Emergency Department Provider Note   ____________________________________________    I have reviewed the triage vital signs and the nursing notes.   HISTORY  Chief Complaint Abdominal Pain     HPI Carrie Meyers is a 70 y.o. female who presents with upper abdominal pain.  Patient describes left upper quadrant abdominal pain, as well as epigastric pain.  She reports is been ongoing for over a day.  She reports it is relatively severe.  She feels somewhat bloated as well.  She is on Eliquis for atrial fibrillation.  Reports that she has had diarrhea the last day as well.  No vomiting, has not noticed any blood in her stool.  Past Medical History:  Diagnosis Date   A-fib (HCC)    Abdominal bloating    Atherosclerosis of native coronary artery    Depression    Fatigue    Generalized anxiety disorder    Hip pain, bilateral    History of emphysema    Hyperlipidemia    Hypertension    Panic attacks    Pruritus of scalp    Right-sided back pain    Skin lesion     There are no problems to display for this patient.   Past Surgical History:  Procedure Laterality Date   APPENDECTOMY     BREAST BIOPSY Left 2000   negative   BREAST EXCISIONAL BIOPSY Left 2000   negative   c section     NECK SURGERY      Prior to Admission medications   Medication Sig Start Date End Date Taking? Authorizing Provider  sucralfate (CARAFATE) 1 g tablet Take 1 tablet (1 g total) by mouth 4 (four) times daily -  with meals and at bedtime for 15 days. 01/21/21 02/05/21 Yes Jene Every, MD  apixaban (ELIQUIS) 5 MG TABS tablet Take 1 tablet (5 mg total) by mouth 2 (two) times daily. 08/06/20 10/05/20  Sondra Barges, PA-C  cetirizine (ZYRTEC) 10 MG tablet Take 10 mg by mouth daily.    [provider]  irbesartan (AVAPRO) 150 MG tablet Take 150 mg by mouth daily.    [provider]  LORazepam (ATIVAN) 0.5 MG tablet Take 1 tablet by mouth  daily as needed. 08/03/19   [provider]  metoprolol tartrate (LOPRESSOR) 25 MG tablet Take 1 tablet (25 mg total) by mouth 2 (two) times daily. 12/26/20 12/26/21  Sondra Barges, PA-C  pantoprazole (PROTONIX) 20 MG tablet Take 20 mg by mouth daily.    [provider]  rosuvastatin (CRESTOR) 10 MG tablet Take 10 mg by mouth daily. 11/03/19   [provider]  TRELEGY ELLIPTA 100-62.5-25 MCG/INH AEPB Inhale 1 puff into the lungs daily. 06/22/20   [provider]     Allergies Patient has no known allergies.  Family History  Problem Relation Age of Onset   Breast cancer Neg Hx     Social History Social History   Tobacco Use   Smoking status: Every Day    Packs/day: 0.50    Years: 45.00    Pack years: 22.50    Types: Cigarettes   Smokeless tobacco: Never  Vaping Use   Vaping Use: Never used  Substance Use Topics   Alcohol use: Never   Drug use: Never    Review of Systems  Constitutional: No fever/chills Eyes: No visual changes.  ENT: No sore throat. Cardiovascular: Denies chest pain. Respiratory: Denies shortness of breath. Gastrointestinal: As above Genitourinary: Negative  for dysuria. Musculoskeletal: Negative for back pain. Skin: Negative for rash. Neurological: Negative for headaches or weakness   ____________________________________________   PHYSICAL EXAM:  VITAL SIGNS: ED Triage Vitals  Enc Vitals Group     BP 01/21/21 1334 (!) 157/130     Pulse Rate 01/21/21 1334 63     Resp 01/21/21 1334 18     Temp 01/21/21 1334 98.3 F (36.8 C)     Temp Source 01/21/21 1334 Oral     SpO2 01/21/21 1334 99 %     Weight 01/21/21 1335 45.4 kg (100 lb)     Height 01/21/21 1335 1.575 m (5\' 2" )     Head Circumference --      Peak Flow --      Pain Score 01/21/21 1335 8     Pain Loc --      Pain Edu? --      Excl. in GC? --     Constitutional: Alert and oriented.  Eyes: Conjunctivae are normal.  Head: Atraumatic. Nose: No  congestion/rhinnorhea. Mouth/Throat: Mucous membranes are moist.   Neck:  Painless ROM Cardiovascular: Normal rate, regular rhythm.  Respiratory: Normal respiratory effort.  No retractions. Lungs CTAB. Gastrointestinal: Soft, mild distention, epigastric tenderness which is moderate, guaiac negative  Musculoskeletal:  Warm and well perfused Neurologic:  Normal speech and language. No gross focal neurologic deficits are appreciated.  Skin:  Skin is warm, dry and intact. No rash noted. Psychiatric: Mood and affect are normal. Speech and behavior are normal.  ____________________________________________   LABS (all labs ordered are listed, but only abnormal results are displayed)  Labs Reviewed  COMPREHENSIVE METABOLIC PANEL - Abnormal; Notable for the following components:      Result Value   Calcium 8.8 (*)    All other components within normal limits  CBC - Abnormal; Notable for the following components:   RBC 3.04 (*)    Hemoglobin 8.0 (*)    HCT 26.1 (*)    RDW 18.6 (*)    All other components within normal limits  URINALYSIS, ROUTINE W REFLEX MICROSCOPIC - Abnormal; Notable for the following components:   APPearance CLEAR (*)    Hgb urine dipstick TRACE (*)    All other components within normal limits  LIPASE, BLOOD  APTT  PROTIME-INR  TYPE AND SCREEN  TROPONIN I (HIGH SENSITIVITY)  TROPONIN I (HIGH SENSITIVITY)   ____________________________________________  EKG   ____________________________________________  RADIOLOGY  CT abdomen pelvis reviewed by me, no acute abnormality, pending radiology read ____________________________________________   PROCEDURES  Procedure(s) performed: No  Procedures   Critical Care performed: No ____________________________________________   INITIAL IMPRESSION / ASSESSMENT AND PLAN / ED COURSE  Pertinent labs & imaging results that were available during my care of the patient were reviewed by me and considered in my  medical decision making (see chart for details).   Patient presents with epigastric pain as detailed above, significant tenderness on exam, will send for CT abdomen pelvis.  Patient refused IV morphine, IV Zofran, did consent to IV Protonix  Presentation is suspicious for possible peptic ulcer disease, gastritis, given that she is on Eliquis and hemoglobin is 8.0 down from 12.28 months ago concerning for GI bleed.  Guaiac test is negative  CT scan is reassuring, patient is adamant that she has no interest in staying in the hospital, I encouraged her strongly to follow-up with GI and return to the ED immediately if symptoms worsen      ____________________________________________   FINAL  CLINICAL IMPRESSION(S) / ED DIAGNOSES  Final diagnoses:  Acute gastritis without hemorrhage, unspecified gastritis type        Note:  This document was prepared using Dragon voice recognition software and may include unintentional dictation errors.    Jene Every, MD 01/21/21 [phone]

## 2021-01-21 NOTE — ED Provider Notes (Signed)
Emergency Medicine Provider Triage Evaluation Note  Carrie Meyers , a 70 y.o. female  was evaluated in triage.  Pt complains of upper abdominal pain, laceration of left upper quadrant, refers around the flank into the upper left shoulder and mid back.  Patient denies any associated nausea or vomiting.  She also denies any constipation but had diarrhea this morning.  She denies any aggravating or alleviating factors.  Patient is on Eliquis for A. fib but denies any hematemesis or hematochezia.  Review of Systems  Positive: LUQ abd pain Negative: NV, FCS  Physical Exam  BP (!) 157/130 (BP Location: Left Arm)   Pulse 63   Temp 98.3 F (36.8 C) (Oral)   Resp 18   Ht 5\' 2"  (1.575 m)   Wt 45.4 kg   SpO2 99%   BMI 18.29 kg/m  Gen:   Awake, no distress  NAD Resp:  Normal effort CTA MSK:   Moves extremities without difficulty  Other:  ABD: distended, soft, nontender  Medical Decision Making  Medically screening exam initiated at 1:35 PM.  Appropriate orders placed.  Carrie Meyers was informed that the remainder of the evaluation will be completed by another provider, this initial triage assessment does not replace that evaluation, and the importance of remaining in the ED until their evaluation is complete.  With ED evaluation of epigastric and left upper abdominal pain with referral to the left shoulder flank.  She reports belching and gas but denies any frank chest pain, shortness of breath, nausea, vomiting.   Madelaine Bhat, PA-C 01/21/21 1337    01/23/21, MD 01/21/21 [phone]

## 2021-01-21 NOTE — ED Triage Notes (Signed)
Pt here with abd pain in her LUQ that started Wed night. Pt does not have an appendix but otherwise normal. Pt states diarrhea but denies N/V. Pt in NAD in triage.

## 2021-01-27 ENCOUNTER — Encounter: Payer: Self-pay | Admitting: Family Medicine

## 2021-04-04 DIAGNOSIS — I480 Paroxysmal atrial fibrillation: Secondary | ICD-10-CM

## 2021-04-04 MED ORDER — APIXABAN 5 MG PO TABS: 5.0000 mg | ORAL_TABLET | Freq: Two times a day (BID) | ORAL | 0 refills | Status: DC

## 2021-04-04 MED ORDER — METOPROLOL TARTRATE 25 MG PO TABS: 25.0000 mg | ORAL_TABLET | Freq: Two times a day (BID) | ORAL | 0 refills | Status: DC

## 2021-05-02 ENCOUNTER — Telehealth: Payer: Self-pay | Admitting: *Deleted

## 2021-05-02 NOTE — Telephone Encounter (Signed)
LMTC to schedule Yearly Lung CA CT Scan. 

## 2021-05-28 NOTE — Telephone Encounter (Signed)
Pt name and DOB verified.    Who is calling, name of caller? Pt    Who is your current or most recent primary care provider and   which hospital were they affiliated with? Shelly Perkins, [facility] in [place] [address] [place]     How long did you see them for? Three and a half years     Is there a reason that you are looking for a new provider? Moved from [place]    Would you like to provide your e-mail to sign up for out My Chart system? Yes, e-mail in chart  The benefits of being a My Chart member are:    -Request medical appointments  -View your health summary from the My Chart electronic health record.  -View test results.  -Request prescription renewals.  -Access trusted health information resources.  -Communicate electronically and securely with your medical care team.    Would you like a text message or letter with your activation code? Yes    Do you have a preference about the provider you will see? No    When was your last Annual or Well Child Exam? Pt does not remember, over a year    Do you have any ongoing or chronic conditions you are currently being treated   for (e.g. Hypertension, Diabetes, Depression, etc)? If yes list: High blood pressure, cholesterol, AFIB,     Have you been seen by a provider for these chronic conditions in the last 6 months? If so, who? No    Do you need an appointment to establish care or is there something more   urgent you need to be seen for? Explain: Low red blood count     Are you on any chronic controlled substances such as opioids, stimulants, or Benzodiazepines? (if no go ahead and book appointment, if yes please read the following: No    "Please be aware that many of our providers will not prescribe long-term controlled substances, even if these were prescribed to you by a previous provider. This means your prescription may be changed, decreased, or discontinued by the provider you will be seeing. For all other prescriptions, it is important that you  understand that a provider may not be able to refill prescriptions at the first visit as they need to get to know more about you and your medical conditions and history before safely prescribing medications"   Informed? Yes or No Yes      Do you have any questions about what we discussed? No  (If no go ahead and book, if yes please get the details and send to the practice)      [name] here is your complete list of upcoming appointments.     Future Appointments   Date Time Provider Department Center   09/16/2021 10:45 AM Shelly Moras, MD [facility]         **Thank you for choosing [place] for your health care needs.   [name] I do want to let you know that you will need to contact your insurance company and make them aware of your new Primary Care Provider which is Dr. Rise Perkins. Also [name] a new patient packet will be coming to you. Would you like to have this release of information faxed to you? No If faxed what is the number? No If not, you can stop by our practice to complete the form or we can mail you the form. Mail  Notes:     CB#: [phone] (home)

## 2021-05-28 NOTE — Telephone Encounter (Signed)
Sent welcome letter

## 2021-06-28 ENCOUNTER — Other Ambulatory Visit: Payer: Self-pay | Admitting: Cardiovascular Disease

## 2021-06-28 DIAGNOSIS — I480 Paroxysmal atrial fibrillation: Secondary | ICD-10-CM

## 2021-06-28 MED ORDER — APIXABAN 5 MG PO TABS: 5.0000 mg | ORAL_TABLET | Freq: Two times a day (BID) | ORAL | 0 refills | Status: DC

## 2021-06-28 NOTE — Telephone Encounter (Signed)
Prescription refill request for Eliquis received. ?Indication: Afib  ?Last office visit:08/22/20 Mariah Milling) ?Scr: 0.69 (01/21/21) ?Age: 71 ?Weight: 54.4kg ? ?Appropriate dose and refill sent to requested pharmacy.  ?

## 2021-06-30 ENCOUNTER — Observation Stay
Admit: 2021-06-30 | Discharge: 2021-06-30 | Disposition: A | Discharge status: Home / Self Care | Payer: MEDICARE | Admitting: Emergency Medicine

## 2021-06-30 DIAGNOSIS — R1013 Epigastric pain: Secondary | ICD-10-CM

## 2021-06-30 LAB — COMPREHENSIVE METABOLIC PANEL W/ REFLEX TO MG FOR LOW K
ALT: 20 U/L (ref 12–78)
AST: 23 U/L (ref 10–37)
Albumin: 3.8 g/dL (ref 3.4–5.0)
Alk Phosphatase: 95 U/L (ref 43–117)
BUN: 22 MG/DL (ref 7–22)
CO2: 28 mmol/L (ref 21–32)
Calcium: 9.3 MG/DL (ref 8.5–10.1)
Chloride: 106 mmol/L (ref 98–108)
Creatinine: 0.82 MG/DL (ref 0.55–1.10)
Est, Glom Filt Rate: >60 mL/min/{1.73_m2} (ref >60)
Glucose: 87 mg/dL (ref 74–106)
Potassium: 4.3 mmol/L (ref 3.4–5.1)
Sodium: 138 mmol/L (ref 136–145)
Total Bilirubin: 0.2 mg/dL (ref 0.00–1.00)
Total Protein: 7.5 g/dL (ref 6.4–8.2)

## 2021-06-30 LAB — CBC WITH AUTO DIFFERENTIAL
Basophils %: 1 % (ref 0–2)
Basophils Absolute: 0 10*3/uL (ref 0.0–0.1)
Eosinophils %: 3 % (ref 0–5)
Eosinophils Absolute: 0.2 10*3/uL (ref 0.0–0.4)
Hematocrit: 34.2 % — ABNORMAL LOW (ref 37.0–47.0)
Hemoglobin: 11.3 g/dL — ABNORMAL LOW (ref 12.0–16.0)
Immature Granulocytes %: 0 % (ref 0.0–0.6)
Immature Granulocytes Absolute: 0 10*3/uL (ref 0.00–0.04)
Lymphocytes Absolute: 2.2 10*3/uL (ref 1.2–3.7)
Lymphocytes: 34 % (ref 14–46)
MCH: 30.7 PG (ref 27.0–31.0)
MCHC: 33 g/dL (ref 33.0–37.0)
MCV: 92.9 FL (ref 80.0–94.0)
MPV: 9.5 FL (ref 7.4–10.4)
Monocytes %: 10 % (ref 5–12)
Monocytes Absolute: 0.6 10*3/uL (ref 0.2–1.0)
Neutrophils Absolute: 3.3 10*3/uL (ref 1.6–6.1)
Nucleated RBCs: 0 PER 100 WBC
Platelets: 273 10*3/uL (ref 130–400)
RBC: 3.68 M/uL — ABNORMAL LOW (ref 4.20–5.40)
RDW: 15 % — ABNORMAL HIGH (ref 11.5–14.5)
Seg Neutrophils: 52 % (ref 47–80)
WBC: 6.3 10*3/uL (ref 4.5–10.9)
nRBC: 0 10*3/uL

## 2021-06-30 LAB — PROTIME-INR
INR: 1 (ref 0.9–1.1)
Protime: 13.6 s (ref 12.2–14.7)

## 2021-06-30 LAB — APTT: APTT: 32.4 s (ref 24.1–36.0)

## 2021-06-30 LAB — TYPE AND SCREEN
ABO/Rh: O NEG
Antibody Screen: NEGATIVE

## 2021-06-30 LAB — LIPASE: Lipase: 52 U/L (ref 13–75)

## 2021-06-30 MED ORDER — SODIUM CHLORIDE 0.9 % IV SOLN
0.9 % | INTRAVENOUS | Status: DC | PRN
Start: 2021-06-30 — End: 2021-06-30

## 2021-06-30 MED ORDER — SUCRALFATE 1 G PO TABS
1 GM | ORAL | Status: AC
Start: 2021-06-30 — End: 2021-06-30
  Administered 2021-06-30: 19:00:00 1 g via ORAL

## 2021-06-30 MED ORDER — LIDOCAINE 4 % EX CREA
4 % | CUTANEOUS | Status: DC | PRN
Start: 2021-06-30 — End: 2021-06-30

## 2021-06-30 MED ORDER — PANTOPRAZOLE SODIUM 20 MG PO TBEC
20 MG | ORAL_TABLET | Freq: Every day | ORAL | 1 refills | Status: DC
Start: 2021-06-30 — End: 2021-10-03

## 2021-06-30 MED ORDER — DEXTROSE IN LACTATED RINGERS 5 % IV SOLN
5 % | INTRAVENOUS | Status: DC
Start: 2021-06-30 — End: 2021-06-30
  Administered 2021-06-30: 17:00:00 via INTRAVENOUS

## 2021-06-30 MED ORDER — NORMAL SALINE FLUSH 0.9 % IV SOLN
0.9 % | Freq: Two times a day (BID) | INTRAVENOUS | Status: DC
Start: 2021-06-30 — End: 2021-06-30
  Administered 2021-06-30: 17:00:00 10 mL via INTRAVENOUS

## 2021-06-30 MED ORDER — SUCRALFATE 1 G PO TABS
1 GM | ORAL_TABLET | Freq: Four times a day (QID) | ORAL | 0 refills | Status: DC
Start: 2021-06-30 — End: 2021-09-16

## 2021-06-30 MED ORDER — SODIUM CHLORIDE FLUSH 0.9 % IV SOLN
0.9 % | INTRAVENOUS | Status: AC
Start: 2021-06-30 — End: 2021-06-30
  Administered 2021-06-30: 17:00:00 10 mL via INTRAVENOUS

## 2021-06-30 MED FILL — BD POSIFLUSH 0.9 % IV SOLN: 0.9 % | INTRAVENOUS | Qty: 10

## 2021-06-30 MED FILL — SUCRALFATE 1 G PO TABS: 1 GM | ORAL | Qty: 1

## 2021-06-30 MED FILL — DEXTROSE IN LACTATED RINGERS 5 % IV SOLN: 5 % | INTRAVENOUS | Qty: 1000

## 2021-06-30 MED FILL — BD POSIFLUSH 0.9 % IV SOLN: 0.9 % | INTRAVENOUS | Qty: 40

## 2021-06-30 NOTE — Discharge Instructions (Addendum)
-   Lab work looks good, no critical anemia, no evidence of acute infection, no blood in your poop, no evidence of pancreatitis or other emergent causes of abdominal pain.  - Avoid alcohol, tobacco, marijuana, spicy foods, anti-inflammatory medicines and soda.  - Protonix: 40 mg daily  - Sucralfate: 1 g with meals and at bedtime for 2 weeks  - Low fat, non-spicy diet  - We left a message with your PCP to see if they can move your appointment up, but it is unlikely that we will know anything until after the weekend.  - Return to the emergency department for fever, bloody stools, recurrence of vomiting, inability to keep down liquids, worsening abdominal pain, new or concerning symptoms or any other problems or concerns.

## 2021-06-30 NOTE — ED Triage Notes (Signed)
Diagnosed with an ulcer in February. Recently moved to the area and continues to have upper abd. Pain. Feels tired all the time. Has appointment with PCP in July. Denies any blood in urine or stools.

## 2021-06-30 NOTE — ED Provider Notes (Signed)
Chief Complaint   Patient presents with    Abdominal Pain       HPI  Shelly Perkins is a 71 y.o. year old female who presents with/for evaluation of chronic mid to upper abdominal pain and fatigue.  She is concerned that this is related to an ulcer with possible bleeding.  She was diagnosed as having an ulcer back when she was living and [place].  Reports having a CT imaging as an initial evaluation for her abdominal pain and reports that it was negative for any acute findings.  She was told that her discomfort could be due to an ulcer, but she was unable to have a follow-up upper GI endoscopy due to moving here.  The pain is described as generally constant with varying levels of severity.  She does report association between heating and increased severity of the pain.  The pain is located above her umbilicus and towards the left side of her upper abdomen.  She is not in any other acute complaints and has not noticed any blood or melenic changes to her stool.    Medical History:    Current Problem List:   There is no problem list on file for this patient.      Past Medical History (No pertinent Med/Surg/Social history unless listed below or reported in HPI):    Past Medical History:   Diagnosis Date    Atrial fibrillation (HCC)     GERD (gastroesophageal reflux disease)     Hypertension        Past Surgical History:   Procedure Laterality Date    APPENDECTOMY      CESAREAN SECTION      NECK SURGERY      fusion       Social History     Socioeconomic History    Marital status: Married     Spouse name: Not on file    Number of children: Not on file    Years of education: Not on file    Highest education level: Not on file   Occupational History    Not on file   Tobacco Use    Smoking status: Every Day     Types: Cigarettes    Smokeless tobacco: Not on file   Substance and Sexual Activity    Alcohol use: Not on file    Drug use: Not on file    Sexual activity: Not on file   Other Topics Concern    Not on file    Social History Narrative    Not on file     Social Determinants of Health     Financial Resource Strain: Not on file   Food Insecurity: Not on file   Transportation Needs: Not on file   Physical Activity: Not on file   Stress: Not on file   Social Connections: Not on file   Intimate Partner Violence: Not on file   Housing Stability: Not on file       Family History (No pertinent history unless listed below or reported in HPI):    History reviewed. No pertinent family history.    Medications:    Patient medication list reviewed:  Patient's Medications   New Prescriptions    SUCRALFATE (CARAFATE) 1 GM TABLET    Take 1 tablet by mouth 4 times daily for 14 days   Previous Medications    APIXABAN (ELIQUIS) 5 MG TABS TABLET    Take by mouth 2 times daily  IRBESARTAN (AVAPRO) 150 MG TABLET    Take 1 tablet by mouth nightly    METOPROLOL TARTRATE (LOPRESSOR) 25 MG TABLET    Take 1 tablet by mouth 2 times daily Pt unsure of dose    ROSUVASTATIN (CRESTOR) 10 MG TABLET    Take 1 tablet by mouth daily   Modified Medications    Modified Medication Previous Medication    PANTOPRAZOLE (PROTONIX) 20 MG TABLET pantoprazole (PROTONIX) 20 MG tablet       Take 2 tablets by mouth daily    Take 1 tablet by mouth daily   Discontinued Medications    No medications on file       Anticoagulants / Antiplatelet medications:  apixaban Tabs - 5 MG    PDMP Review:  No flowsheet data found.    Allergies:      Patient has no known allergies.    Review of Systems   All other systems reviewed and are negative.      Vitals:    06/30/21 1146 06/30/21 1149   BP: (!) 141/42    Pulse: 64    Resp: 20    Temp: 98.2 F (36.8 C)    TempSrc: Temporal    SpO2: 98%    Weight:  93 lb (42.2 kg)   Height: 5' 2"  (1.575 m)      Physical Exam  ALL VITAL SIGNS REVIEWED AND INTERPRETED BY ME, AS DOCUMENTED IN NURSING NOTES  General:  AAO, NAD  Head:  NC/AT  Eyes:  conjunctiva clear, lids and lashes are normal  ENT:  external ears and nose and mouth are  normal  NECK:  no visible masses, no restriction in movement  CV:  extremities are well perfused, no mottling  Resp:  symmetric chest wall excursion, no resp distress  Abdomen: soft, non-tender, no rebound or rigidity   RECTAL:  Digital rectal exam reveals good sphincter tone with no fissures. Hemorrhoidal skin tags present, no thrombosis.  Stool is Hemoccult negative, stool brown in appearance.  MSK/EXT:  no visible joint effusion, injury or deformity   Skin:  no rash on exposed extremities, warm and dry  Neuro: GCS 15, no focal deficit, speech is fluent, face is symmetric  Psych:  calm and cooperative, normal affect      Medical Decision Making:    Differential Diagnosis:  Dyspepsia, GERD, peptic ulcer disease, iron deficiency anemia, acute blood loss anemia, coagulopathy, pancreatitis, biliary colic    MDM:  Shelly Perkins is a 71 y.o. year old female who presents with/for evaluation of upper abdominal pain and fatigue with possible peptic ulcer disease.  The patient does not appear acutely ill, vital signs are generally reassuring.  She will need further screening and risk stratification for upper GI bleeding.  It is reassuring that her stool Hemoccult test is negative. This patient will be admitted to ED observation for abdominal pain and GI bleed. Observation services are necessary to determine the patient's disposition.  During this observation period we will perform therapeutic interventions and assess the patient's response to these interventions, perform serial diagnostic studies and/or assessments that require the passage of time to allow decision making and appropriate treatment, monitor for ongoing hemodynamic stability given the elevated risk/concern for hemodynamic deterioration, and perform ongoing management while awaiting transfer for definitive care whether that be in house or transfer to another facility.       Additional information was gathered from the following independent historian(s):  -  the following record sources:  --  this EMR (internal records)  -- [facility]  PCP care establishment record.  No other records available.  Pt is on eliquis for a fib  Medical Decision Making  Amount and/or Complexity of Data Reviewed  Labs: ordered. Decision-making details documented in ED Course.    Risk  OTC drugs.  Prescription drug management.        ED Course:    ED Course as of 06/30/21 1424   Sun Jun 30, 2021   1332 CBC with Auto Differential(!):    WBC 6.3   RBC 3.68(!)   Hemoglobin Quant 11.3(!)   Hematocrit 34.2(!)   MCV 92.9   MCH 30.7   MCHC 33.0   RDW 15.0(!)   Platelet Count 273   MPV 9.5   Nucleated Red Blood Cells 0.0   Nucleated Red Blood Cells 0.00   Seg Neutrophils 52   Lymphocytes 34   Monocytes 10   Eosinophils % 3   Basophils 1   Immature Granulocytes 0   Segs Absolute 3.3   Absolute Lymph # 2.2   Absolute Mono # 0.6   Absolute Eos # 0.2   Basophils Absolute 0.0   Absolute Immature Granulocyte 0.0   Differential Type AUTOMATED  Interpretation by me, mild anemia. No previous values available for comparison. [CS]   1417 Comprehensive Metabolic Panel w/ Reflex to MG:    Sodium 138   Potassium 4.3   Chloride 106   CO2 28   Glucose, Random 87   BUN,BUNPL 22   Creatinine 0.82   Est, Glom Filt Rate >60   CALCIUM, SERUM, 500694 9.3   BILIRUBIN TOTAL 0.20   ALT 20   AST 23   Alk Phosphatase 95   Total Protein 7.5   Albumin 3.8  Interpreted by me, no acute abnormalities. [CS]   1610 Lipase:    Lipase 52  Interpreted by me, negative for pancreatitis. [CS]   9604 APTT:    PTT 32.4  Interpretation, negative for coagulopathy. [CS]   1418 Protime-INR:    Prothrombin Time 13.6   INR 1.0  Interpreted by me, negative for coagulopathy [CS]   1423 The patient will be discharged from ED observation as a decision regarding disposition has been made.  The patient was monitored throughout the observation period.  At the end of observation, the patient's condition is Stable.  The patient's disposition at the end of  observation is: discharge home   [CS]      ED Course User Index  [CS] Andrey Farmer, MD       Procedures    Vital Signs for this visit:    Patient Vitals for the past 12 hrs:   Temp Pulse Resp BP SpO2   06/30/21 1146 98.2 F (36.8 C) 64 20 (!) 141/42 98 %       Lab findings during this visit (only abnormal values will be noted, if no value noted then the result was normal range):    Labs Reviewed   CBC WITH AUTO DIFFERENTIAL - Abnormal; Notable for the following components:       Result Value    RBC 3.68 (*)     Hemoglobin 11.3 (*)     Hematocrit 34.2 (*)     RDW 15.0 (*)     All other components within normal limits   APTT   PROTIME-INR   LIPASE   COMPREHENSIVE METABOLIC PANEL W/ REFLEX TO MG FOR LOW K   TYPE AND SCREEN  Radiology studies during this visit:    No orders to display       No results found.    Medications given in the ED:    Medications   sodium chloride flush 0.9 % injection 5-40 mL (10 mLs IntraVENous Given 06/30/21 1312)   0.9 % sodium chloride infusion (has no administration in time range)   lidocaine (LMX) 4 % cream (has no administration in time range)   dextrose 5 % in lactated ringers infusion ( IntraVENous New Bag 06/30/21 1312)   sucralfate (CARAFATE) tablet 1 g (has no administration in time range)   sodium chloride flush 0.9 % injection 10 mL (10 mLs IntraVENous Given 06/30/21 1312)       Diagnosis:    Final diagnoses:   Dyspepsia       Disposition:    Decision To Discharge 06/30/2021 02:23:10 PM    Discharge prescriptions and/or changes if applicable:       Medication List        START taking these medications      sucralfate 1 GM tablet  Commonly known as: Carafate  Take 1 tablet by mouth 4 times daily for 14 days            CHANGE how you take these medications      pantoprazole 20 MG tablet  Commonly known as: PROTONIX  Take 2 tablets by mouth daily  What changed: how much to take            ASK your doctor about these medications      Eliquis 5 MG Tabs tablet  Generic drug:  apixaban     irbesartan 150 MG tablet  Commonly known as: AVAPRO     metoprolol tartrate 25 MG tablet  Commonly known as: LOPRESSOR     rosuvastatin 10 MG tablet  Commonly known as: CRESTOR               Where to Get Your Medications        These medications were sent to [facility], [place] [phone]  [place], [place] [address]      Phone: [phone]   pantoprazole 20 MG tablet  sucralfate 1 GM tablet         Follow-up if applicable:    No follow-up provider specified.    Please note that portions of this document were created using the M*Modal Fluency Direct dictation system.  Any inconsistencies or typographical errors may be the result of mis-transcription that persist in spite of proof-reading and should be addressed with the document creator.        Andrey Farmer, MD  06/30/21 [phone]

## 2021-07-01 ENCOUNTER — Other Ambulatory Visit: Payer: Self-pay | Admitting: Cardiovascular Disease

## 2021-09-16 ENCOUNTER — Ambulatory Visit: Admit: 2021-09-16 | Discharge: 2021-09-16 | Payer: MEDICARE | Attending: Family Medicine | Primary: Family Medicine

## 2021-09-16 DIAGNOSIS — Z0000 Encounter for general adult medical examination without abnormal findings: Secondary | ICD-10-CM

## 2021-09-16 DIAGNOSIS — D649 Anemia, unspecified: Secondary | ICD-10-CM

## 2021-09-16 MED ORDER — LORAZEPAM 0.5 MG PO TABS
0.5 MG | ORAL_TABLET | Freq: Every evening | ORAL | 0 refills | Status: DC
Start: 2021-09-16 — End: 2021-10-16

## 2021-09-16 NOTE — Assessment & Plan Note (Signed)
Upper abdominal pain could be due to gastritis, pancreatitis,PUD, cholecystitis.  Advised her to increase her food intake as tolerated.  Refer to GI for further evaluation and treatment.

## 2021-09-16 NOTE — Assessment & Plan Note (Signed)
Will continue with Lorazepam for Insomnia and Anxiety.

## 2021-09-16 NOTE — Assessment & Plan Note (Signed)
Advised her to continue with Eliquis and the propofol as prescribed.  Refer to cardiology for follow-up.

## 2021-09-16 NOTE — Assessment & Plan Note (Signed)
History of anxiety and has been taking lorazepam 0.5 mg at nighttime.  It helps her with anxiety and sleep as well.  No HI/SI.  Discussed the medication and side effects with her.  She signed the [facility]SA contract.  Will continue with the 0.5 mg of lorazepam at nighttime.

## 2021-09-16 NOTE — Assessment & Plan Note (Signed)
Likely due to iron deficiency due to blood loss or chronic inflammatory process in the body.  Advised her to increase water and food intake.  Take over-the-counter multivitamin with the Iron.  Referred to GI for further eval

## 2021-09-16 NOTE — Assessment & Plan Note (Signed)
Discussed diet and exercise with her.  Increase water and fiber intake.  Consume more fresh fruits and vegetables.  Avoid processed foods and soft drinks.  Avoid alcohol and tobacco use.  Avoid the recreational drugs.  Aerobic exercise 30-40 minutes 3-4 times.  Recommended self-breast exam.  Recommended self skin exam.  Apply sunscreens while in the sun.  Reviewed the recent labs.

## 2021-09-16 NOTE — Progress Notes (Signed)
I was present for the duration of the breast exam for this patient.

## 2021-09-16 NOTE — Progress Notes (Signed)
[facility]   [address]  [place] [address]    CHIEF COMPLAINT   Shelly Perkins is a 71 y.o. female who presents to the office today for New Patient     HISTORY OF PRESENT ILLNESS   71 year old female is here for physical exam as a new patient.  She moved from [place].  She was seen in the hospital there and the was diagnosed with peptic ulcer as her hematocrit was low.  Complaining of fall on going upper abdominal pain.  Pain increases after eating food.  Denies any black tarry stools or coffee-ground vomitus. She was prescribed Protonix and has been taking as prescribed.  She is current on colonoscopy and last colonoscopy was normal.   History of tobacco use for the last the 40 + years and  gets low CT chest done every year.  Last CT chest was normal for any pathology.  History of anxiety and the was prescribed the lorazepam by a her PCM in [place].  She takes at nighttime which helps her with sleep as well.  Denies any side effects of the med. No HI/SI  Hx of HTN and and monitor blood pressure at home.  She  has been taking her medications as prescribed w/o any side effects.   History of AFib and the has been taking Eliquis.  Denies any side effects of the medication.  History of hyperlipidemia and been taking Crestor 10 mg.  Denies any side effects of the medication.          MEDICATIONS     Current Outpatient Medications   Medication Sig    LORazepam (ATIVAN) 0.5 MG tablet Take 1 tablet by mouth nightly for 30 days. Max Daily Amount: 0.5 mg    rosuvastatin (CRESTOR) 10 MG tablet Take 1 tablet by mouth daily    irbesartan (AVAPRO) 150 MG tablet Take 1 tablet by mouth nightly    apixaban (ELIQUIS) 5 MG TABS tablet Take by mouth 2 times daily    metoprolol tartrate (LOPRESSOR) 25 MG tablet Take 1 tablet by mouth 2 times daily Pt unsure of dose    pantoprazole (PROTONIX) 20 MG tablet Take 2 tablets by mouth daily     No current facility-administered medications for this visit.      Medications Discontinued During This Encounter   Medication Reason    sucralfate (CARAFATE) 1 GM tablet Therapy completed       ALLERGIES   No Known Allergies  ACTIVE MEDICAL PROBLEMS     Patient Active Problem List   Diagnosis    Normal routine history and physical examination    Tobacco abuse disorder    Anemia    Anxiety    Other insomnia    Pain of upper abdomen     PAST SURGICAL HISTORY     Past Surgical History:   Procedure Laterality Date    APPENDECTOMY      CESAREAN SECTION      NECK SURGERY      fusion     SOCIAL HISTORY     Social History     Social History Narrative    Not on file     FAMILY HISTORY     Family History   Problem Relation Age of Onset    Cancer Father      VITALS     Vitals:    09/16/21 1048 09/16/21 1052   BP: 122/70 130/60   Site: Left Upper Arm Right Upper  Arm   Position: Sitting Sitting   Cuff Size: Small Adult Medium Adult   Pulse: 62    Resp: 12    Weight: 96 lb 6.4 oz (43.7 kg)    Height: 5' 1.5" (1.562 m)     - Body mass index is 17.92 kg/m.    PHYSICAL EXAM   Physical Exam  Vitals and nursing note reviewed.   Constitutional:       General: She is not in acute distress.     Appearance: Normal appearance. She is not ill-appearing, toxic-appearing or diaphoretic.   HENT:      Head: Normocephalic and atraumatic.      Right Ear: Tympanic membrane, ear canal and external ear normal.      Left Ear: Tympanic membrane, ear canal and external ear normal.      Mouth/Throat:      Mouth: Mucous membranes are dry.      Pharynx: No oropharyngeal exudate or posterior oropharyngeal erythema.   Eyes:      Extraocular Movements: Extraocular movements intact.      Conjunctiva/sclera: Conjunctivae normal.      Pupils: Pupils are equal, round, and reactive to light.   Neck:      Vascular: No carotid bruit.   Cardiovascular:      Rate and Rhythm: Normal rate. Rhythm irregular.      Heart sounds: Normal heart sounds.   Pulmonary:      Effort: Pulmonary effort is normal. No respiratory distress.       Breath sounds: Normal breath sounds. No wheezing, rhonchi or rales.   Abdominal:      General: Bowel sounds are normal. There is no distension.      Palpations: Abdomen is soft. There is no mass.      Tenderness: There is abdominal tenderness. There is no right CVA tenderness, left CVA tenderness, guarding or rebound.   Musculoskeletal:         General: Normal range of motion.      Cervical back: Normal range of motion and neck supple. No rigidity.   Lymphadenopathy:      Cervical: No cervical adenopathy.   Neurological:      General: No focal deficit present.      Mental Status: She is alert and oriented to person, place, and time.      Cranial Nerves: No cranial nerve deficit.      Sensory: No sensory deficit.      Motor: No weakness.      Coordination: Coordination normal.      Gait: Gait normal.      Deep Tendon Reflexes: Reflexes normal.   Psychiatric:         Mood and Affect: Mood normal.         Behavior: Behavior normal.           SCREENING  QUESTIONNAIRES     STOP-BANG (3+ high risk)   No flowsheet data found.     DEPRESSION SCREENING  Severity:  1-4 low risk; 5-9 mild depressive symptoms; 10-14 mild major depression; 15-19 moderate major depression; 20-27 severe major depression  PHQ-9  09/16/2021   Little interest or pleasure in doing things 0   Feeling down, depressed, or hopeless 0   PHQ-2 Score 0   PHQ-9 Total Score 0        CAGE-AID (if needed)  No flowsheet data found.     PREVENTIVE CARE     IMMUNIZATIONS    There is no immunization  history on file for this patient.    Health Maintenance Due   Topic Date Due    COVID-19 Vaccine (1) Never done    Pneumococcal 65+ years Vaccine (1 - PCV) Never done    Lipids  Never done    Depression Screen  Never done    Hepatitis C screen  Never done    DTaP/Tdap/Td vaccine (1 - Tdap) Never done    Colorectal Cancer Screen  Never done    Breast cancer screen  Never done    Shingles vaccine (1 of 2) Never done    DEXA (modify frequency per FRAX score)  Never done     Annual Wellness Visit (AWV)  Never done       ASSESSMENT AND PLAN      1. Normal routine history and physical examination  Assessment & Plan:   Discussed diet and exercise with her.  Increase water and fiber intake.  Consume more fresh fruits and vegetables.  Avoid processed foods and soft drinks.  Avoid alcohol and tobacco use.  Avoid the recreational drugs.  Aerobic exercise 30-40 minutes 3-4 times.  Recommended self-breast exam.  Recommended self skin exam.  Apply sunscreens while in the sun.  Reviewed the recent labs.  2. Tobacco abuse disorder  Assessment & Plan:   Discussed different options for her to quit smoking.  She has cut down the number of cigarettes per day.  Order low does CT chest.  Will follow-up on the results.  Orders:  -     CT CHEST WO CONTRAST; Future  3. Anemia, unspecified type  Assessment & Plan:  Likely due to iron deficiency due to blood loss or chronic inflammatory process in the body.  Advised her to increase water and food intake.  Take over-the-counter multivitamin with the Iron.  Referred to GI for further eval  Orders:  -     External Referral To Gastroenterology  4. Anxiety  Assessment & Plan:   History of anxiety and has been taking lorazepam 0.5 mg at nighttime.  It helps her with anxiety and sleep as well.  No HI/SI.  Discussed the medication and side effects with her.  She signed the [facility] contract.  Will continue with the 0.5 mg of lorazepam at nighttime.  Orders:  -     LORazepam (ATIVAN) 0.5 MG tablet; Take 1 tablet by mouth nightly for 30 days. Max Daily Amount: 0.5 mg, Disp-30 tablet, R-0Normal  5. Other insomnia  Assessment & Plan:   Will continue with Lorazepam for Insomnia and Anxiety.  Orders:  -     LORazepam (ATIVAN) 0.5 MG tablet; Take 1 tablet by mouth nightly for 30 days. Max Daily Amount: 0.5 mg, Disp-30 tablet, R-0Normal  6. Pain of upper abdomen  Assessment & Plan:   Upper abdominal pain could be due to gastritis, pancreatitis,PUD, cholecystitis.  Advised her to  increase her food intake as tolerated.  Refer to GI for further evaluation and treatment.  7. Paroxysmal atrial fibrillation (HCC)  -     Referral to [facility] - [facility]      PREVENTIVE CARE EDUCATION & COUNSELING:  Healthy diet, maintaining a healthy weight, and getting at least 30 minutes of exercise most every day.    Immunizations:   Influenza Vaccine: Discussed annual vaccination.    Pneumococcal Vaccine: Once for high risk patients - (Smokers, Alcoholics, Chronic Lung, Heart, or Liver Dz), repeat at 65  Shingles Vaccine:  (Shingrix for those over 50, 2  shots 2-6 months apart)  COVID-19 Vaccine status reviewed and discussed  TdaP (every 10 yrs)  Colon CA Screening:    Colonoscopy (Q10Y), FIT test (Q1Y), or Cologuard (Q3Y) from 45-75 with consideration of ongoing screening from 76-85.    Mammography (every 1-2 years, 75-75 y.o):    Cervical Cancer Screening: (every 3-5 yrs from 21-65 y.o.):    Lung CA Screening with Low dose CT:    50-80 y.o., 20 PYH, and currently smoking or quit less than 15 years ago, Annually  Tobacco Hx:   reports that she has been smoking cigarettes. She has been smoking an average of .25 packs per day. She has never used smokeless tobacco.   Hepatitis C screening:  (once for 18-79 y.o., annual for high risk)  HIV Screen:  (once for 15-65 y.o., annual for high risk)  Cholesterol screening:  (every 5 years after age 71)   Diabetes screening in adults ages 49 to 79 who are overweight or obese.   Reviewed safe amounts of alcohol use (no more than 1 drink per day).    All healthcare maintenance items due as above.     Follow up:  No follow-ups on file.     Future Appointments   Date Time Provider Department Center   09/18/2022 10:45 AM Cornelius Moras, MD [facility]       Cornelius Moras, MD  09/16/2021

## 2021-09-16 NOTE — Assessment & Plan Note (Signed)
Discussed different options for her to quit smoking.  She has cut down the number of cigarettes per day.  Order low does CT chest.  Will follow-up on the results.

## 2021-09-27 ENCOUNTER — Ambulatory Visit: Payer: MEDICARE | Primary: Family Medicine

## 2021-09-28 ENCOUNTER — Other Ambulatory Visit: Payer: Self-pay | Admitting: Cardiovascular Disease

## 2021-09-28 DIAGNOSIS — I480 Paroxysmal atrial fibrillation: Secondary | ICD-10-CM

## 2021-09-30 NOTE — Telephone Encounter (Signed)
Refill request

## 2021-09-30 NOTE — Telephone Encounter (Signed)
Attempted to schedule.  LMOV to call office.  ° °

## 2021-09-30 NOTE — Telephone Encounter (Signed)
Prescription refill request for Eliquis received. Indication: PAF Last office visit: 08/22/20  Concha Se MD Scr: 0.69 on 01/21/21 Age: 71 Weight: 42.2kg  Based on above findings Eliquis 5mg  twice daily is the appropriate dose.  Pt is past due to see Dr .  Message sent to Mariah Milling.  Refill approved x 1.

## 2021-10-01 ENCOUNTER — Telehealth: Payer: Self-pay | Admitting: Cardiovascular Disease

## 2021-10-01 ENCOUNTER — Encounter

## 2021-10-01 NOTE — Telephone Encounter (Signed)
Patient has moved out of stated no longer need [place] for a cardiologist. Please advise

## 2021-10-01 NOTE — Telephone Encounter (Signed)
Formatting of this note might be different from the original.  Patient has moved out of stated no longer need [place] for a cardiologist. Please advise  Electronically signed by Threasa Alpha A at 10/01/2021  3:44 PM EDT

## 2021-10-01 NOTE — Telephone Encounter (Signed)
Shelly Perkins  29-Oct-1950      Call back needed: No    Preferred call back number:   Home Phone [phone] (home)    Medications Requested:  Requested Prescriptions     Pending Prescriptions Disp Refills    pantoprazole (PROTONIX) 20 MG tablet 30 tablet 1     Sig: Take 2 tablets by mouth daily    rosuvastatin (CRESTOR) 10 MG tablet 30 tablet      Sig: Take 1 tablet by mouth daily    metoprolol tartrate (LOPRESSOR) 25 MG tablet 60 tablet      Sig: Take 1 tablet by mouth 2 times daily Pt unsure of dose    irbesartan (AVAPRO) 150 MG tablet 30 tablet      Sig: Take 1 tablet by mouth nightly    apixaban (ELIQUIS) 5 MG TABS tablet 60 tablet      Sig: Take by mouth 2 times daily       Preferred Pharmacy:   [facility] - [place], [place] - [address] - P [phone] - F [phone]  [address]  [place] [address]  Phone: [phone] Fax: [phone]      Other Instructions: Patient needs all the meds refilled . Patent is out of the rosuvastatin    Patient has been informed of the 2 full business days refill policy (weekends and holidays do not count).  They were also advised to contact their pharmacy first, if they have not heard from them directly in 2 business days, to receive an update on the refill processing.

## 2021-10-02 NOTE — Telephone Encounter (Signed)
metoprolol tartrate (LOPRESSOR) 25 MG tablet          Sig: Take 1 tablet by mouth 2 times daily Pt unsure of dose    Disp:  60 tablet    Refills:      Start: 10/01/2021    Class: Normal    Last ordered: 3 months ago by Historical Provider, MD     Beta-Blockers Protocol Passed 10/01/2021 03:29 PM   Protocol Details  Last Pulse reading greater than 50 recorded within past year    Visit with authorizing provider in past 9 months or upcoming 90 days       irbesartan (AVAPRO) 150 MG tablet         Sig: Take 1 tablet by mouth nightly    Disp:  30 tablet    Refills:      Start: 10/01/2021    Class: Normal    Last ordered: 3 months ago by Historical Provider, MD     ARB Refill Protocol Passed 10/01/2021 03:29 PM   Protocol Details  Last creatinine level resulted within the past 12 months    Last potassium level normal, within the past 12 months    Visit with authorizing provider in past 9 months or upcoming 90 days        pantoprazole (PROTONIX) 20 MG tablet          Sig: Take 2 tablets by mouth daily    Disp:  30 tablet    Refills:  1    Start: 10/01/2021    Class: Normal    Last ordered: 3 months ago by Andrey Farmer, MD     Rx Refill Protocol Proton Pump Inhibitors Passed 10/01/2021 03:29 PM    Visit with authorizing provider in past 9 months or upcoming 90 days    No positive pregnancy test or active pregnancy on record in past 12 months    No history of osteoporosis    No history of chronic kidney disease (CKD)        apixaban (ELIQUIS) 5 MG TABS tablet          Sig: Take by mouth 2 times daily    Disp:  60 tablet    Refills:      Start: 10/01/2021    Class: Normal    Non-formulary    Last ordered: 3 months ago by Historical Provider, MD        To be filled at: [place], [place] [phone] Wanda Plump [phone]     "Medication: Eliquis  Last Office Visit: 1 year: Yes (09/16/2021)  Lab Monitoring: CBC, CMP annually: Yes (Both done 06/30/2021) Check chart for  duration of therapy  Category: Oral Anticoagulant  Length of refill: 1 year  Brand Name: Yes  Comments:"    Protocols met for above medications.      rosuvastatin (CRESTOR) 10 MG tablet          Sig: Take 1 tablet by mouth daily    Disp:  30 tablet    Refills:      Start: 10/01/2021    Class: Normal    Last ordered: 3 months ago by Historical Provider, MD     Hmg CoA Reductase Inhibitors Protocol Failed 10/01/2021 03:29 PM   Protocol Details  Last Lipid panel resulted within the past 12 months    Visit with authorizing provider in past 9 months or upcoming 90 days  Last AST and ALT levels < or = to 3x normal within the past 3 years        Future appointment: 09/18/2022    Protocol not met. Routed to provider.

## 2021-10-03 MED ORDER — APIXABAN 5 MG PO TABS
5 MG | ORAL_TABLET | Freq: Two times a day (BID) | ORAL | 3 refills | Status: DC
Start: 2021-10-03 — End: 2022-03-26

## 2021-10-03 MED ORDER — PANTOPRAZOLE SODIUM 20 MG PO TBEC
20 MG | ORAL_TABLET | Freq: Every day | ORAL | 1 refills | Status: DC
Start: 2021-10-03 — End: 2021-10-24

## 2021-10-03 MED ORDER — IRBESARTAN 150 MG PO TABS
150 MG | ORAL_TABLET | Freq: Every evening | ORAL | 3 refills | Status: AC
Start: 2021-10-03 — End: 2021-12-26

## 2021-10-03 MED ORDER — METOPROLOL TARTRATE 25 MG PO TABS
25 MG | ORAL_TABLET | Freq: Two times a day (BID) | ORAL | 3 refills | Status: AC
Start: 2021-10-03 — End: 2021-12-16

## 2021-10-14 NOTE — Telephone Encounter (Signed)
Received fax for refill  [facility]

## 2021-10-15 ENCOUNTER — Telehealth

## 2021-10-15 MED ORDER — ROSUVASTATIN CALCIUM 10 MG PO TABS
10 MG | ORAL_TABLET | Freq: Every day | ORAL | 1 refills | Status: DC
Start: 2021-10-15 — End: 2022-03-10

## 2021-10-15 NOTE — Telephone Encounter (Signed)
Pt declined to schedule her lung CT because it wasn't ordered correctly. It should be a low-dose lung CT, see order queued up below.     Also covering provider recently refilled rosuvastatin, but she needs a lipid profile. Order queued up below.

## 2021-10-15 NOTE — Telephone Encounter (Signed)
rosuvastatin (CRESTOR) 10 MG tablet           Possible duplicate: Hover to review recent actions on this medication    Sig: Take 1 tablet by mouth daily    Disp:  90 tablet    Refills:  1    Start: 10/15/2021    Class: Normal    Last ordered: 3 months ago by Historical Provider, MD     Hmg CoA Reductase Inhibitors Protocol Failed 10/15/2021 10:13 AM   Protocol Details  Last Lipid panel resulted within the past 12 months    Visit with authorizing provider in past 9 months or upcoming 90 days    Last AST and ALT levels < or = to 3x normal within the past 3 years      To be filled at: [place], [place] [phone] Wanda Plump [phone]     Future appointment: 09/18/2022    Protocol not met, pt needs a lipid panel drawn. Routed to provider to advise.

## 2021-10-24 MED ORDER — PANTOPRAZOLE SODIUM 20 MG PO TBEC
20 MG | ORAL_TABLET | ORAL | 0 refills | Status: AC
Start: 2021-10-24 — End: 2022-07-25

## 2021-10-24 NOTE — Telephone Encounter (Signed)
Rx Refill Protocol Proton Pump Inhibitors Passed 10/24/2021 11:53 AM    Visit with authorizing provider in past 9 months or upcoming 90 days    No positive pregnancy test or active pregnancy on record in past 12 months    No history of osteoporosis    No history of chronic kidney disease (CKD)      Next ov 09/18/22

## 2021-11-11 ENCOUNTER — Ambulatory Visit: Payer: MEDICARE | Primary: Family Medicine

## 2021-11-11 DIAGNOSIS — Z720 Tobacco use: Secondary | ICD-10-CM

## 2021-11-14 ENCOUNTER — Telehealth

## 2021-11-14 NOTE — Telephone Encounter (Signed)
Mychart sent    --------  6 month follow up CT chest/ abd(adrenal gland) entered with reason.    Please review and sign.

## 2021-11-14 NOTE — Telephone Encounter (Signed)
-----   Message from Mont Dutton, MD sent at 11/13/2021 10:16 PM EDT -----  Please let the patient know to f/u with radiology for repeat chest CT and adrenal gland CT in 6 months.

## 2021-11-20 ENCOUNTER — Telehealth: Payer: Self-pay | Admitting: Cardiovascular Disease

## 2021-11-20 NOTE — Telephone Encounter (Signed)
   Pre-operative Risk Assessment    Patient Name: Carrie Meyers  DOB: 01-01-1951 MRN: [id number]      Request for Surgical Clearance    Procedure:  Upper Endoscopy  Date of Surgery:  Clearance 12/06/21                                 Surgeon:  Dr. Claretta Fraise Surgeon's Group or Practice Name:  [facility] Phone number:  [phone] Fax number:  [phone]   Type of Clearance Requested:  hold Eliquis for 2 days    Type of Anesthesia:  Not Indicated   Additional requests/questions:    Signed, Pilar A Ham   11/20/2021, 2:06 PM

## 2021-11-20 NOTE — Telephone Encounter (Signed)
Formatting of this note is different from the original.  Images from the original note were not included.      Pre-operative Risk Assessment     Patient Name: Shelly Perkins   DOB: 1950-12-02  MRN: [id number]         Request for Surgical Clearance      Procedure:  Upper Endoscopy    Date of Surgery:  Clearance 12/06/21                                 Surgeon:  Dr. Claretta Fraise  Surgeon's Group or Practice Name:  [facility]  Phone number:  [phone]  Fax number:  [phone]    Type of Clearance Requested:  hold Eliquis for 2 days      Type of Anesthesia:  Not Indicated    Additional requests/questions:      Signed,  Pilar A Ham    11/20/2021, 2:06 PM    Electronically signed by Johnny Bridge A at 11/20/2021  2:09 PM EDT

## 2021-11-21 NOTE — Telephone Encounter (Signed)
Patient with diagnosis of atrial fibrillation on Eliquis for anticoagulation.    Procedure: upper endoscopy Date of procedure: 12/06/21   CHA2DS2-VASc Score = 4   This indicates a 4.8% annual risk of stroke. The patient's score is based upon: CHF History: 0 HTN History: 1 Diabetes History: 0 Stroke History: 0 Vascular Disease History: 1 Age Score: 1 Gender Score: 1   CrCl 46 Platelet count 273  Per office protocol, patient can hold Eliquis for 2 days prior to procedure.   Patient will not need bridging with Lovenox (enoxaparin) around procedure.  **This guidance is not considered finalized until pre-operative APP has relayed final recommendations.**

## 2021-11-21 NOTE — Telephone Encounter (Signed)
   Patient Name: Carrie Meyers  DOB: 01-12-1951 MRN: [id number]  Primary Cardiologist: Ida Rogue, MD  Clinical pharmacists have reviewed the patient's past medical history, labs, and current medications as part of preoperative protocol coverage. The following recommendations have been made:  Patient with diagnosis of atrial fibrillation on Eliquis for anticoagulation.     Procedure: upper endoscopy Date of procedure: 12/06/21     CHA2DS2-VASc Score = 4   This indicates a 4.8% annual risk of stroke. The patient's score is based upon: CHF History: 0 HTN History: 1 Diabetes History: 0 Stroke History: 0 Vascular Disease History: 1 Age Score: 1 Gender Score: 1   CrCl 46 Platelet count 273   Per office protocol, patient can hold Eliquis for 2 days prior to procedure.  Patient will not need bridging with Lovenox (enoxaparin) around procedure. Please resume Eliquis as soon as possible postprocedure, at the discretion of the surgeon.    I will route this recommendation to the requesting party via Epic fax function and remove from pre-op pool.  Please call with questions.  Lenna Sciara, NP 11/21/2021, 1:24 PM

## 2021-11-21 NOTE — Telephone Encounter (Signed)
Formatting of this note might be different from the original.      Patient Name: Shelly Perkins   DOB: Oct 10, 1950  MRN: [id number]    Primary Cardiologist: Ida Rogue, MD    Clinical pharmacists have reviewed the patient's past medical history, labs, and current medications as part of preoperative protocol coverage. The following recommendations have been made:    Patient with diagnosis of atrial fibrillation on Eliquis for anticoagulation.      Procedure: upper endoscopy  Date of procedure: 12/06/21      CHA2DS2-VASc Score = 4    This indicates a 4.8% annual risk of stroke.  The patient's score is based upon:  CHF History: 0  HTN History: 1  Diabetes History: 0  Stroke History: 0  Vascular Disease History: 1  Age Score: 1  Gender Score: 1    CrCl 46  Platelet count 273    Per office protocol, patient can hold Eliquis for 2 days prior to procedure.  Patient will not need bridging with Lovenox (enoxaparin) around procedure. Please resume Eliquis as soon as possible postprocedure, at the discretion of the surgeon.     I will route this recommendation to the requesting party via Epic fax function and remove from pre-op pool.    Please call with questions.    Lenna Sciara, NP  11/21/2021, 1:24 PM    Electronically signed by Lenna Sciara, NP at 11/21/2021  1:24 PM EDT

## 2021-11-21 NOTE — Telephone Encounter (Signed)
Formatting of this note might be different from the original.  Patient with diagnosis of atrial fibrillation on Eliquis for anticoagulation.      Procedure: upper endoscopy  Date of procedure: 12/06/21    CHA2DS2-VASc Score = 4    This indicates a 4.8% annual risk of stroke.  The patient's score is based upon:  CHF History: 0  HTN History: 1  Diabetes History: 0  Stroke History: 0  Vascular Disease History: 1  Age Score: 1  Gender Score: 1    CrCl 46  Platelet count 273    Per office protocol, patient can hold Eliquis for 2 days prior to procedure.    Patient will not need bridging with Lovenox (enoxaparin) around procedure.    **This guidance is not considered finalized until pre-operative APP has relayed final recommendations.**    Electronically signed by Rockne Menghini, RPH-CPP at 11/21/2021  1:08 PM EDT

## 2021-11-27 NOTE — Assessment & Plan Note (Signed)
Associated Problem(s): Essential hypertension  Formatting of this note might be different from the original.  -She was asked to keep a log for the next 2 weeks and call us back so we can guide her further for up titration of blood pressure medications.  Electronically signed by Garlan Fair, MD at 11/27/2021  1:45 PM EDT

## 2021-11-27 NOTE — Assessment & Plan Note (Signed)
Associated Problem(s): Paroxysmal atrial fibrillation (CMS-HCC) (CMS/HCC)  Formatting of this note might be different from the original.  -Per body habitus she does not appear to have sleep apnea however she admits that she snores at night.  -Remains largely asymptomatic from A-fib.  -We will continue current regimen with no changes  Electronically signed by Garlan Fair, MD at 11/27/2021  1:43 PM EDT

## 2021-11-27 NOTE — Progress Notes (Signed)
Formatting of this note is different from the original.  Outpatient Evaluation on 11/27/2021    Patient: Shelly Perkins    PCP: Leata Mouse, MD    Reason for Visit:   Chief Complaint   Patient presents with    New Patient    Atrial fibrillation     Specialty Summary/Visit Context:  No specialty comments available.    Assessment & Plan:  Problem List Items Addressed This Visit         Cardiology    Essential hypertension    Overview     (A) slightly elevated today.  She reports higher blood pressures with prior visits.  I do suspect that she needs uptitration of blood pressure medications.  (B) currently on metoprolol tartrate 25 twice daily and irbesartan 150 mg daily       Current Assessment & Plan     -She was asked to keep a log for the next 2 weeks and call us back so we can guide her further for up titration of blood pressure medications.       Paroxysmal atrial fibrillation (CMS-HCC) (CMS/HCC) - Primary    Overview     (A) CHA2DS2-VASc score of 2.  Low A-fib burden under 1%.  22 runs of SVT.  1 run of NSVT lasting 6 beats.  (B) echocardiogram from 2022 reveals normal LVEF, no valvular dysfunction.  (C) she is on metoprolol tartrate 25 mg twice daily, apixaban for stroke prevention.  (D) today she is in sinus rhythm.       Current Assessment & Plan     -Per body habitus she does not appear to have sleep apnea however she admits that she snores at night.  -Remains largely asymptomatic from A-fib.  -We will continue current regimen with no changes       Relevant Orders    EKG 12-LEAD (Completed)      Other    Anemia    Overview     (A) persistent chronic, with some fluctuations all way to the 8.  She had work-up last year that she says was negative from the GI perspective.  She is having an upcoming EGD.  If apixaban needs to be held for the procedure it should be okay without bridging given that her chads Vascor is 2.        Other Visit Diagnoses       Hyperlipidemia, unspecified hyperlipidemia type         Relevant Orders    LIPID PANEL         Subjective:     I had the pleasure of meeting Shelly Perkins today, that comes establish care with cardiology with chief complaints of paroxysmal A-fib and hypertension.  I met her today earlier when she was accompanying her husband who also became my patient today.  She looks great and feels great, appears younger than her biological age.  Stays very active around the home, taking cellar stairs on daily basis, doing laundry, doing house chores and walking her dog multiple times a day.  Denies palpitations, dizziness, falls, syncope, lower extremity edema.    Her main medical concerns are anemia of unknown cause.  She did have last year work-up with no significant findings in setting of hemoglobin of 8.  Her hemoglobin now is 11.3.  She does have an upcoming EGD here further exploration.  No visible signs of bleeding.  She is on Eliquis 5 mg twice daily in the setting of her chads Vascor of  2.  We reviewed Holter monitoring from April 2022, she had 1 run of NSVT lasting 6 beats, 22 runs of SVT and A-fib with under 1% burden.  She has noticed lately that her blood pressure has been on the higher side.    To be seen in 6 months for blood pressure control and A-fib.    Allergies:  No Known Allergies  Medications:  Current Outpatient Medications   Medication Sig    LORazepam (Ativan) 0.5 MG Tab Take 1 Tablet by mouth daily as needed.    apixaban (ELIQUIS) 5 MG Tab Take 5 mg by mouth 2 times daily.    irbesartan (Avapro) 150 MG Tab Take 150 mg by mouth daily.    metoprolol tartrate (Lopressor) 25 MG Tab Take 25 mg by mouth 2 times daily.    pantoprazole sodium (Protonix) 20 MG Tablet Delayed Response Take 20 mg by mouth 2 (two) times a day.    rosuvastatin (Crestor) 10 MG Tab Take 10 mg by mouth daily.     Review of Systems   Constitution: Positive for malaise/fatigue. Negative for chills, diaphoresis, fever, weight gain and weight loss.   HENT:   Negative for hearing loss and nosebleeds.    Eyes:  Negative for blurred vision, pain and photophobia.   Cardiovascular:  Negative for chest pain, claudication, irregular heartbeat, leg swelling, near-syncope, orthopnea, palpitations, paroxysmal nocturnal dyspnea and syncope.   Respiratory:  Positive for snoring. Negative for cough, hemoptysis, sleep disturbances due to breathing, wheezing, dyspnea with exertion and dyspnea at rest.    Hematologic/Lymphatic: Does not bruise/bleed easily.   Skin:  Negative for itching and rash.   Musculoskeletal:  Positive for arthritis. Negative for falls and myalgias.   Gastrointestinal:  Positive for abdominal pain and heartburn. Negative for hematochezia, melena, nausea and vomiting.   Genitourinary:  Negative for frequency, hematuria and urgency.   Neurological:  Positive for light-headedness. Negative for dizziness, headaches, loss of balance, seizures, stroke and weakness.   Psychiatric/Behavioral:   Negative for depression, memory loss, substance abuse and feelings of nervousness/anxiousness.   Allergic/Immunologic: Positive for seasonal allergies.   Objective:    Vitals:    11/27/21 1306   BP: 142/60   BP Site: Left arm   Patient Position: Sitting   Cuff Size: Small Adult   Pulse: 54   SpO2: 97%   Weight: 44.5 kg (98 lb 3.2 oz)   Height: 1.562 m (5' 1.5")     Body mass index is 18.26 kg/m.    Constitutional: Well-developed. No acute distress.   Integumentary: Warm and dry to touch. No rash noted.   Head: Normocephalic and atraumatic. Non-tender.   Mouth/Throat: Mucosa moist. Good dentition.   Eyes: EOM intact. Conjunctivae are unremarkable.   Neck: Supple. No JVD.   Pulmonary/Chest: No tenderness to palpation. Clear to auscultation. She has no wheezes. She has no rales.   Cardiac: Regular rhythm, S1 normal with S2 normal. No gallop, S3 or S4. No murmur or friction rub.   Pulses:       Carotid pulses are 2+ on the right side with no bruit, and 2+ on the left side with no bruit.       Radial pulses are 2+ on the right  side and 2+ on the left side.        Dorsalis pedis pulses are 2+ on the right side and 2+ on the left side.        Posterior tibial pulses are 2+ on the  right side and 2+ on the left side.   Abdomen: Abdomen soft. Non-tender.   Musculoskeletal / Extremities / Back: No edema.No clubbing and cyanosis. No erythema.   Neurological: Alert and oriented to person, place, and time.   Psychiatric: She has a normal mood and affect.     Relevant Labs    CBC:  Component Value Date   WBC 6.3 06/30/2021   RBC 3.68 06/30/2021   HGB 11.3 06/30/2021   HCT 34.2 06/30/2021   MCV 92.9 06/30/2021   PLAT 273 06/30/2021   BMP:  Component Value Date   NA 138 06/30/2021   K 4.3 06/30/2021   CL 106 06/30/2021   CO2 28 06/30/2021   ANIONGAP None found None found   GLUCOSE 87 06/30/2021   BUN 22 06/30/2021   CREATININE 0.82 06/30/2021   BUNCR None found None found   CALCIUM 9.3 06/30/2021   EGFR (MDRD) None found None found   EGFRNON None found None found   EGFRAFR None found None found     CMP:  Component Value Date   NA 138 06/30/2021   K 4.3 06/30/2021   CL 106 06/30/2021   CO2 28 06/30/2021   ANIONGAP None found None found   GLUCOSE 87 06/30/2021   BUN 22 06/30/2021   CREATININE 0.82 06/30/2021   BUNCR None found None found   CALCIUM 9.3 06/30/2021   PROTEIN 7.5 06/30/2021   ALB 3.8 06/30/2021   AG None found None found   BILITOT 0.20 06/30/2021   ALKPHOS 95 06/30/2021   AST 23 06/30/2021   ALT 20 06/30/2021   CBC Hemogram None found None found   EGFR (MDRD) None found None found   EGFRNON None found None found   EGFRAFR None found None found     LFT:  Component Value Date   ALT 20 06/30/2021   AST 23 06/30/2021   GGT None found None found   ALKPHOS 95 06/30/2021   BILITOT 0.20 06/30/2021     INR:  Lab Results       Component                Value               Date                       INR                      1.0                 06/30/2021              Relevant Screenings    CHA2DS2-VASc Total Score: 2    Today's ECG Result   EKG 12-LEAD   Result Value    ECG Heart Rate 60    ECG P-R Interval 150     ECG QRSD Interval 71    ECG QT Interval 402    ECG QTc 402    ECG QRS Horizontal Axis 0    ECG QRS Axis 83    Impression    Sinus rhythm   Borderline  right axis deviation   No prior tracing available for comparison      Garlan Fair, MD  11/27/2021    Follow-up and Dispositions    Return in about 6 months (around 05/29/2022) for Return Visit with Clinical Cardiologist.  Electronically signed by Garlan Fair, MD at 11/27/2021  1:52 PM EDT

## 2021-12-16 MED ORDER — METOPROLOL TARTRATE 25 MG PO TABS
25 MG | ORAL_TABLET | Freq: Two times a day (BID) | ORAL | 1 refills | Status: DC
Start: 2021-12-16 — End: 2022-07-23

## 2021-12-16 NOTE — Telephone Encounter (Signed)
Name from pharmacy: METOPROLOL TARTRATE 25 MG Tablet         Will file in chart as: metoprolol tartrate (LOPRESSOR) 25 MG tablet    Sig: TAKE 1 TABLET TWICE DAILY    Disp:  180 tablet    Refills:  10    Start: 12/15/2021    Class: Normal    Non-formulary    Last ordered: 2 months ago (10/03/2021) by Mont Dutton, MD    Last refill: 10/03/2021    Rx #: [id number]    Beta-Blockers Protocol Passed 12/15/2021 06:43 AM   Protocol Details  Last Pulse reading greater than 50 recorded within past year    Visit with authorizing provider in past 9 months or upcoming 90 days      To be filled at: [place], [place] [phone]

## 2021-12-26 MED ORDER — IRBESARTAN 150 MG PO TABS
150 MG | ORAL_TABLET | ORAL | 1 refills | Status: DC
Start: 2021-12-26 — End: 2022-05-26

## 2021-12-26 NOTE — Telephone Encounter (Signed)
Name from pharmacy: IRBESARTAN 150 MG Tablet         Will file in chart as: irbesartan (AVAPRO) 150 MG tablet    Sig: TAKE 1 TABLET EVERY NIGHT    Disp:  90 tablet    Refills:  10    Start: 12/26/2021    Class: Normal    Non-formulary    Last ordered: 2 months ago (10/03/2021) by Mont Dutton, MD    Last refill: 10/14/2021    Rx #: [id number]    ARB Refill Protocol Passed 12/26/2021 04:50 PM   Protocol Details  Last creatinine level resulted within the past 12 months    Last potassium level normal, within the past 12 months    Visit with authorizing provider in past 9 months or upcoming 90 days      To be filled at: [place], [place] [phone]

## 2021-12-27 ENCOUNTER — Encounter

## 2022-01-06 ENCOUNTER — Telehealth: Payer: Self-pay | Admitting: *Deleted

## 2022-01-06 NOTE — Telephone Encounter (Signed)
Eliquis 5mg  refill request received. Patient is 71 years old, weight-45.4kg, Crea-0.69 on 01/21/2021, Diagnosis-Afib, and last seen by Dr. 01/23/2021 on 08/22/2020-needs appt. Dose is appropriate based on dosing criteria.   Pt has an [place]. Called pt to see if she has a new provider since on 11/27/21 there is a note from [facility] and if so, will need to have rx sent to new provider. Left a detail msg for her to call back. Will await.   [place] out of state therefore, will not send in refill request.

## 2022-01-06 NOTE — Telephone Encounter (Signed)
Formatting of this note might be different from the original.  Eliquis 5mg  refill request received. Patient is 71 years old, weight-45.4kg, Crea-0.69 on 01/21/2021, Diagnosis-Afib, and last seen by Dr. Rockey Situ on 08/22/2020-needs appt. Dose is appropriate based on dosing criteria.     Pt has an address in [place]. Called pt to see if she has a new provider since on 11/27/21 there is a note from [facility] and if so, will need to have rx sent to new provider. Left a detail msg for her to call back. Will await.     Pt has moved out of state therefore, will not send in refill request.   Electronically signed by Marcos Eke, RN at 01/07/2022  9:56 AM EST

## 2022-03-10 MED ORDER — ROSUVASTATIN CALCIUM 10 MG PO TABS
10 MG | ORAL_TABLET | Freq: Every day | ORAL | 3 refills | Status: DC
Start: 2022-03-10 — End: 2023-01-03

## 2022-03-10 NOTE — Telephone Encounter (Signed)
Name from pharmacy: ROSUVASTATIN CALCIUM 10 MG Tablet         Will file in chart as: rosuvastatin (CRESTOR) 10 MG tablet    Sig: TAKE 1 TABLET EVERY DAY    Disp:  90 tablet    Refills:  3    Start: 03/09/2022    Class: Normal    Non-formulary    Last ordered: 4 months ago (10/15/2021) by Victorio Palm, FNP    Last refill: 12/27/2021    Rx #: [id number]    Hmg CoA Reductase Inhibitors Protocol Failed 03/09/2022 04:50 AM   Protocol Details    Last Lipid panel resulted within the past 12 months    Visit with authorizing provider in past 9 months or upcoming 90 days    Last AST and ALT levels < or = to 3x normal within the past 3 years      To be filled at: [place], [place] [phone]     09/18/22

## 2022-03-26 MED ORDER — ELIQUIS 5 MG PO TABS
5 MG | ORAL_TABLET | Freq: Two times a day (BID) | ORAL | 1 refills | Status: AC
Start: 2022-03-26 — End: 2022-09-09

## 2022-03-26 NOTE — Telephone Encounter (Signed)
Name from pharmacy: [place]         Will file in chart as: ELIQUIS 5 MG TABS tablet    Sig: TAKE 1 TABLET TWICE DAILY    Disp:  180 tablet    Refills:  3    Start: 03/25/2022    Class: Normal    Non-formulary    Last ordered: 5 months ago (10/03/2021) by Mont Dutton, MD    Last refill: 01/09/2022    Rx #: [id number]       To be filled at: [place], [place] [phone] Wanda Plump [phone]     "Medication: Eliquis  Last Office Visit: 1 year: Yes (09/16/2021)  Lab Monitoring: CBC & CMP annually, Check chart for duration of therapy: Yes (06/30/2021)  Category: Oral Anticoagulant  Length of refill: 1 year  Brand Name: Yes  Comments:"    Future appointment: 09/18/2022    Protocol met.

## 2022-05-01 NOTE — Telephone Encounter (Signed)
 Per quality report pt is due for a colorectal cancer screening  She established care on 09/16/2021  Per Dr. Sherrill She is current on colonoscopy and last colonoscopy was normal.  Does she recall where this was done?    She is also due for a breast cancer screening  The last mammogram we have on file is from 03/01/2020    I called [phone] and left a message for the pt to call me back [phone]

## 2022-05-06 NOTE — Telephone Encounter (Signed)
Pt read MyChart message on 05/01/2022 and did not respond  No further action is needed at this time re: colorectal screening  I will mail her a letter advising her to call and schedule her mammogram

## 2022-05-13 ENCOUNTER — Inpatient Hospital Stay: Admit: 2022-05-13 | Payer: MEDICARE | Attending: Family Medicine | Primary: Family Medicine

## 2022-05-13 ENCOUNTER — Encounter

## 2022-05-13 DIAGNOSIS — R918 Other nonspecific abnormal finding of lung field: Secondary | ICD-10-CM

## 2022-05-15 ENCOUNTER — Telehealth

## 2022-05-15 NOTE — Telephone Encounter (Signed)
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 Liver , GB, Pancreas ,kidneys and lungs are normal.

## 2022-05-15 NOTE — Telephone Encounter (Signed)
-----   Message from Mont Dutton, MD sent at 05/15/2022  7:47 AM EDT -----  Lung bases are normal. 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

## 2022-05-15 NOTE — Telephone Encounter (Signed)
IMPRESSION:     Stable bilateral pulmonary nodules seen against a background of severe emphysema largest measuring up to 8 mm. Six-month noncontrast chest CT follow-up is recommended.  Left adrenal adenoma.  No effusions or adenopathy.

## 2022-05-15 NOTE — Telephone Encounter (Signed)
See result below.   New Patient 09/16/21  Also, Pt needs outside med recon.   Any therapy for her breathing?  Does she see Pulmonary?    Lmx1 @ Pt home/mobile

## 2022-05-20 NOTE — Telephone Encounter (Signed)
Pt returning Dinah's call, name and DOB verified.    Informed pt of her scan results. Pt does not see pulmonary. Pt expressed awareness and wanted to know if Dr Alfredia Ferguson wanted an repeat scan. Couldn't find any requests for one but I'll route encounter to Dr Alfredia Ferguson just in case.

## 2022-05-20 NOTE — Telephone Encounter (Signed)
Lmx1 @ Pt home/mobile.   Please result notes and questions below.

## 2022-05-26 MED ORDER — IRBESARTAN 150 MG PO TABS
150 | ORAL_TABLET | ORAL | 1 refills | Status: AC
Start: 2022-05-26 — End: ?

## 2022-05-26 NOTE — Telephone Encounter (Signed)
Name from pharmacy: IRBESARTAN 150 MG Tablet          Will file in chart as: irbesartan (AVAPRO) 150 MG tablet    Sig: TAKE 1 TABLET EVERY NIGHT    Disp: 90 tablet    Refills: 3    Start: 05/24/2022    Class: Normal    Non-formulary    Last ordered: 5 months ago (12/26/2021) by Mont Dutton, MD    Last refill: 03/13/2022    Rx #: [id number]    ARB Refill Protocol Passed03/30/2024 02:46 AM   Protocol Details Last creatinine level resulted within the past 12 months    Last potassium level normal, within the past 12 months    Visit with authorizing provider in past 9 months or upcoming 90 days      To be filled at: [place], [place] [phone]     Future appointment: 09/18/2022    Protocol met.

## 2022-07-23 MED ORDER — METOPROLOL TARTRATE 25 MG PO TABS
25 MG | ORAL_TABLET | Freq: Two times a day (BID) | ORAL | 1 refills | Status: AC
Start: 2022-07-23 — End: 2022-12-17

## 2022-07-23 NOTE — Telephone Encounter (Signed)
Beta-Blockers Protocol Passed05/29/2024 04:16 AM   Protocol Details Last Pulse reading greater than 50 recorded within past year    Visit with authorizing provider in past 9 months or upcoming 90 days      Next ov 09/18/22

## 2022-07-25 ENCOUNTER — Ambulatory Visit: Admit: 2022-07-25 | Discharge: 2022-07-25 | Payer: MEDICARE | Attending: Family Medicine | Primary: Family Medicine

## 2022-07-25 DIAGNOSIS — Z0000 Encounter for general adult medical examination without abnormal findings: Secondary | ICD-10-CM

## 2022-07-25 NOTE — Assessment & Plan Note (Signed)
Stable on current dose of Protonix.

## 2022-07-25 NOTE — Assessment & Plan Note (Signed)
Continue with avapro and Metoprolol. Monitor BP at home and keep log of it.

## 2022-07-25 NOTE — Assessment & Plan Note (Signed)
continue with Eliquis and the propofol as prescribed.  Refer to cardiology for follow-up.

## 2022-07-25 NOTE — Assessment & Plan Note (Signed)
Will continue with Lorazepam for Insomnia and Anxiety.

## 2022-07-25 NOTE — Assessment & Plan Note (Signed)
Discussed different options for her to quit smoking.  She has cut down the number of cigarettes per day.  LDCT chest once a year.

## 2022-07-25 NOTE — Assessment & Plan Note (Addendum)
Discussed diet and exercise with her.  Increase water and fiber intake.  Consume more fresh fruits and vegetables.    Avoid processed foods and soft drinks.  Avoid alcohol and tobacco use.  Avoid the recreational drugs.  Aerobic exercise 30-40 minutes 3-4 times.  Recommended self-breast exam.  Recommended self skin exam.  Apply sunscreens while in the sun.  Will check labs and f/u on the results.

## 2022-07-25 NOTE — Progress Notes (Signed)
[facility]   [address]  [place] [address]    MEDICARE ANNUAL WELLNESS VISIT       CHIEF COMPLAINT   Shelly Perkins is a 72 y.o. female who presents today for Medicare AWV     HISTORY OF PRESENT ILLNESS   71 yr, F, is here for annual medicare wellness exam. Hse lives with her husband  and no hx of falls.  Hx of smoking and does not want to quit smoking. He cut down the numbers of the cigarettes to 2 packs per week. She says he had tried all these interventions to quit smoking but nothing worked.  Hx of a.fib and stable on eliquis and metoprolol. She f/u with cardiology every 6 months.  Hx of insomnia and takes Lorazepam as needed.. Some nights she can go to sleep without any medication.  Hx of hyperlipidemia and is on Crestor 10 mg. No side effects. She is compliant with diet and exercise.  Her BP is well controlled on Avapro and metoprolol. No CP, PND or Orthopnea. Denies bladder or bowel dysfunction.  Hx of chronic right sided lower back pain radiating to the right LE.c/o burning pain in the lower extremities and her feet. She continues with stretching exercises. Takes Tylenol as needed.  Hx of Iron deficiency anemia and does not take iron supplements.  GERD is well controlled on higher dose of protonix.  She is due for mammogram. She wants to do cologuard instead of colonoscopy.    PHYSICAL EXAM   Physical Exam  Vitals and nursing note reviewed.   Constitutional:       General: She is not in acute distress.     Appearance: Normal appearance. She is normal weight. She is not ill-appearing.   HENT:      Head: Normocephalic and atraumatic.      Right Ear: Tympanic membrane, ear canal and external ear normal. There is no impacted cerumen.      Left Ear: Tympanic membrane, ear canal and external ear normal. There is no impacted cerumen.   Eyes:      Extraocular Movements: Extraocular movements intact.      Conjunctiva/sclera: Conjunctivae normal.      Pupils: Pupils are equal, round, and reactive to  light.   Cardiovascular:      Rate and Rhythm: Normal rate and regular rhythm.      Pulses: Normal pulses.      Heart sounds: Normal heart sounds.   Pulmonary:      Effort: Pulmonary effort is normal. No respiratory distress.      Breath sounds: Normal breath sounds. No wheezing, rhonchi or rales.   Abdominal:      General: Bowel sounds are normal. There is no distension.      Palpations: Abdomen is soft. There is no mass.      Tenderness: There is no abdominal tenderness. There is no right CVA tenderness, left CVA tenderness, guarding or rebound.   Musculoskeletal:         General: Normal range of motion.   Neurological:      General: No focal deficit present.      Mental Status: She is alert and oriented to person, place, and time.      Cranial Nerves: No cranial nerve deficit.      Sensory: No sensory deficit.      Motor: No weakness.      Coordination: Coordination normal.      Gait: Gait normal.  Deep Tendon Reflexes: Reflexes normal.   Psychiatric:         Mood and Affect: Mood normal.         Behavior: Behavior normal.             MEDICATIONS     Current Outpatient Medications   Medication Sig    pantoprazole (PROTONIX) 40 MG tablet Take 1 tablet by mouth in the morning and at bedtime    LORazepam (ATIVAN) 0.5 MG tablet Take 1 tablet by mouth nightly as needed.    metoprolol tartrate (LOPRESSOR) 25 MG tablet TAKE 1 TABLET TWICE DAILY    irbesartan (AVAPRO) 150 MG tablet TAKE 1 TABLET EVERY NIGHT    apixaban (ELIQUIS) 5 MG TABS tablet TAKE 1 TABLET TWICE DAILY    rosuvastatin (CRESTOR) 10 MG tablet TAKE 1 TABLET EVERY DAY     No current facility-administered medications for this visit.     Medications Discontinued During This Encounter   Medication Reason    pantoprazole (PROTONIX) 20 MG tablet DOSE ADJUSTMENT       ALLERGIES   No Known Allergies  ACTIVE MEDICAL PROBLEMS     Patient Active Problem List   Diagnosis    Normal routine history and physical examination    Tobacco abuse disorder    Anemia     Anxiety    Other insomnia    Pain of upper abdomen    Paroxysmal atrial fibrillation (HCC)    Cervicalgia    Chronic left hip pain    Chronic left shoulder pain    Chronic left-sided low back pain with left-sided sciatica    Essential hypertension    Foraminal stenosis of lumbar region    Hypercholesteremia    Symptomatic menopausal or female climacteric states    Chronic right-sided low back pain with right-sided sciatica    Medicare annual wellness visit, subsequent    Gastroesophageal reflux disease without esophagitis     PAST SURGICAL HISTORY     Past Surgical History:   Procedure Laterality Date    APPENDECTOMY      CESAREAN SECTION      NECK SURGERY      fusion     SOCIAL HISTORY     Social History     Social History Narrative    Not on file     FAMILY HISTORY     Family History   Problem Relation Age of Onset    Cancer Father      VITALS     Vitals:    07/25/22 1620   BP: (!) 110/58   Site: Left Upper Arm   Position: Sitting   Cuff Size: Child   Pulse: 74   Resp: 16   Weight: 43.1 kg (95 lb)   Height: 1.549 m (5\' 1" )    - Body mass index is 17.95 kg/m.    SCREENING  QUESTIONNAIRES     STOP-BANG (3+ high risk)        No data to display                 DEPRESSION SCREENING  Severity:  1-4 low risk; 5-9 mild depressive symptoms; 10-14 mild major depression; 15-19 moderate major depression; 20-27 severe major depression      07/25/2022     4:31 PM   PHQ-9    Little interest or pleasure in doing things 0   Feeling down, depressed, or hopeless 0   PHQ-2 Score 0   PHQ-9 Total Score  0        MEDICARE ANNUAL WELLNESS VISIT HEALTH RISK ASSESSMENT & PLAN       ASSESSMENT AND PLAN     1. Medicare annual wellness visit, subsequent  Assessment & Plan:   Discussed diet and exercise with her.  Increase water and fiber intake.  Consume more fresh fruits and vegetables.    Avoid processed foods and soft drinks.  Avoid alcohol and tobacco use.  Avoid the recreational drugs.  Aerobic exercise 30-40 minutes 3-4  times.  Recommended self-breast exam.  Recommended self skin exam.  Apply sunscreens while in the sun.  Will check labs and f/u on the results.    Orders:  -     Comprehensive Metabolic Panel w/ Reflex to MG; Future  -     Hemoglobin A1C; Future  2. Tobacco abuse disorder  Assessment & Plan:   Discussed different options for her to quit smoking.  She has cut down the number of cigarettes per day.  LDCT chest once a year.  3. Paroxysmal atrial fibrillation (HCC)  Assessment & Plan:   continue with Eliquis and the propofol as prescribed.  Refer to cardiology for follow-up.  4. Other insomnia  Assessment & Plan:   Will continue with Lorazepam for Insomnia and Anxiety.    5. Hypercholesteremia  Assessment & Plan:   Continue with diet and exercise.  Continue with Crestor.  Orders:  -     Lipid Panel; Future  6. Essential hypertension  Assessment & Plan:   Continue with avapro and Metoprolol. Monitor BP at home and keep log of it.  7. Chronic right-sided low back pain with right-sided sciatica  -     Referral to [facility]  8. Iron deficiency anemia, unspecified iron deficiency anemia type  Assessment & Plan:   Likely due to iron deficiency due to blood loss or chronic inflammatory process in the body.  Advised her to increase water and food intake.  Take over-the-counter multivitamin with the Iron.    Orders:  -     CBC; Future  9. Gastroesophageal reflux disease without esophagitis  Assessment & Plan:   Stable on current dose of Protonix.    10. Chronic left-sided low back pain with left-sided sciatica  Assessment & Plan:   Continue with stretching exercises. Apply ice/heat. Take Tylenol as needed.  F/u with physiatry.  11. Screening for colon cancer  -     Cologuard (Fecal DNA Colorectal Cancer Screening)      Follow up:  No follow-ups on file.     Future Appointments   Date Time Provider Department Center   07/28/2023  3:00 PM Cornelius Moras, MD [facility]       Cornelius Moras, MD  5/31/2024Medicare  Annual Wellness Visit    Shelly Perkins is here for Medicare AWV    Assessment & Plan   Medicare annual wellness visit, subsequent  Assessment & Plan:   Discussed diet and exercise with her.  Increase water and fiber intake.  Consume more fresh fruits and vegetables.    Avoid processed foods and soft drinks.  Avoid alcohol and tobacco use.  Avoid the recreational drugs.  Aerobic exercise 30-40 minutes 3-4 times.  Recommended self-breast exam.  Recommended self skin exam.  Apply sunscreens while in the sun.  Will check labs and f/u on the results.    Orders:  -     Comprehensive Metabolic Panel w/ Reflex to MG; Future  -  Hemoglobin A1C; Future  Tobacco abuse disorder  Assessment & Plan:   Discussed different options for her to quit smoking.  She has cut down the number of cigarettes per day.  LDCT chest once a year.  Paroxysmal atrial fibrillation (HCC)  Assessment & Plan:   continue with Eliquis and the propofol as prescribed.  Refer to cardiology for follow-up.  Other insomnia  Assessment & Plan:   Will continue with Lorazepam for Insomnia and Anxiety.    Hypercholesteremia  Assessment & Plan:   Continue with diet and exercise.  Continue with Crestor.  Orders:  -     Lipid Panel; Future  Essential hypertension  Assessment & Plan:   Continue with avapro and Metoprolol. Monitor BP at home and keep log of it.  Chronic right-sided low back pain with right-sided sciatica  -     Referral to [facility]  Iron deficiency anemia, unspecified iron deficiency anemia type  Assessment & Plan:   Likely due to iron deficiency due to blood loss or chronic inflammatory process in the body.  Advised her to increase water and food intake.  Take over-the-counter multivitamin with the Iron.    Orders:  -     CBC; Future  Gastroesophageal reflux disease without esophagitis  Assessment & Plan:   Stable on current dose of Protonix.    Chronic left-sided low back pain with left-sided sciatica  Assessment & Plan:   Continue  with stretching exercises. Apply ice/heat. Take Tylenol as needed.  F/u with physiatry.  Screening for colon cancer  -     Cologuard (Fecal DNA Colorectal Cancer Screening)  Recommendations for Preventive Services Due: see orders and patient instructions/AVS.  Recommended screening schedule for the next 5-10 years is provided to the patient in written form: see Patient Instructions/AVS.     No follow-ups on file.     Subjective       Patient's complete Health Risk Assessment and screening values have been reviewed and are found in Flowsheets. The following problems were reviewed today and where indicated follow up appointments were made and/or referrals ordered.    Positive Risk Factor Screenings with Interventions:       Cognitive:   Clock Drawing Test (CDT): Normal  Words recalled: 2 Words Recalled     Total Score Interpretation: Abnormal Mini-Cog  Interventions:  Patient declines any further evaluation or treatment          Self-assessment of health:  In general, how would you say your health is?: (!) Poor    Interventions:  Patient declines any further evaluation or treatment    General HRA Questions:  Select all that apply: (!) New or Increased Pain, New or Increased Fatigue    Pain Interventions:  See AVS for additional education material  Takes Tylenol as needed  Fatigue Interventions:  Patient declined any further interventions or treatment      Activity, Diet, and Weight:  On average, how many days per week do you engage in moderate to strenuous exercise (like a brisk walk)?: 0 days  On average, how many minutes do you engage in exercise at this level?: 0 min    Do you eat balanced/healthy meals regularly?: (!) No    Body mass index is 17.95 kg/m. (!) Abnormal      Inactivity Interventions:  Patient declined any further interventions or treatment  Do you eat balanced/healthy meals regularly Interventions:  Patient declines any further evaluation or treatment  She is very active physically.  Dentist  Screen:  Have you seen the dentist within the past year?: (!) No    Intervention:  Advised to schedule with their dentist     Vision Screen:  Do you have difficulty driving, watching TV, or doing any of your daily activities because of your eyesight?: (!) Yes  Have you had an eye exam within the past year?: (!) No  No results found.    Interventions:   Patient encouraged to make appointment with their eye specialist      Advanced Directives:  Do you have a Living Will?: (!) No    Intervention:          Tobacco Use:  Tobacco Use: High Risk (07/25/2022)    Patient History     Smoking Tobacco Use: Every Day     Smokeless Tobacco Use: Never     Passive Exposure: Not on file     E-cigarette/Vaping       Questions Responses    E-cigarette/Vaping Use     Start Date     Passive Exposure     Quit Date     Counseling Given     Comments           Interventions:  Patient declined any further intervention or treatment  She has tried all interventions in the past and nothing worked.                    Objective   Vitals:    07/25/22 1620   BP: (!) 110/58   Site: Left Upper Arm   Position: Sitting   Cuff Size: Child   Pulse: 74   Resp: 16   Weight: 43.1 kg (95 lb)   Height: 1.549 m (5\' 1" )      Body mass index is 17.95 kg/m.               No Known Allergies  Prior to Visit Medications    Medication Sig Taking? Authorizing Provider   pantoprazole (PROTONIX) 40 MG tablet Take 1 tablet by mouth in the morning and at bedtime Yes [provider]   LORazepam (ATIVAN) 0.5 MG tablet Take 1 tablet by mouth nightly as needed. Yes [provider]   metoprolol tartrate (LOPRESSOR) 25 MG tablet TAKE 1 TABLET TWICE DAILY Yes Cornelius Moras, MD   irbesartan (AVAPRO) 150 MG tablet TAKE 1 TABLET EVERY NIGHT Yes Aleyda Gindlesperger, Chelsea Aus, MD   apixaban (ELIQUIS) 5 MG TABS tablet TAKE 1 TABLET TWICE DAILY Yes Cornelius Moras, MD   rosuvastatin (CRESTOR) 10 MG tablet TAKE 1 TABLET EVERY DAY Yes Sher Shampine, Chelsea Aus, MD       CareTeam  (Including outside providers/suppliers regularly involved in providing care):   Patient Care Team:  Cornelius Moras, MD as PCP - General (Family Medicine)     Reviewed and updated this visit:  Tobacco  Allergies  Meds  Med Hx  Surg Hx  Soc Hx  Fam Hx

## 2022-07-25 NOTE — Patient Instructions (Signed)
Learning About Emotional Support  When do you need emotional support?     You might find getting support from others helpful when you have a long-term health problem. Often people feel alone, confused, or scared when coping with an illness. But you aren't alone. Other people are going through the same thing you are and know how you feel.  Talking with others about your feelings can help you feel better.  Your family and friends can give you support. So can your doctor, a support group, or a church. If you have a support network, you will not feel as alone. You will learn new ways to deal with your situation, and you may try harder to overcome it.  Where you can get support  Family and friends: They can help you cope by giving you comfort and encouragement.  Counseling: Professional counseling can help you cope with situations that interfere with your life and cause stress. Counseling can help you understand and deal with your illness.  Your doctor: Find a doctor you trust and feel comfortable with. Be open and honest about your fears and concerns. Your doctor can help you get the right medical treatments, including counseling.  Spiritual or religious groups: They can provide comfort and may be able to help you find counseling or other social support services.  Social groups: They can help you meet new people and get involved in activities you enjoy.  Community support groups: In a support group, you can talk to others who have dealt with the same problems or illness as you. You can encourage one another and learn ways to cope with tough emotions.  How can you find a support group?  Finding a support group that works for you may take time. There are many options. Some groups have a group leader who helps lead discussions or shares information. Others are less formal. Some meet in person, while others meet online.  Try using these resources to help you find the best support group for you.  Your doctor, health  care team, or counselor.  People with the same health concern.  Your local church, mosque, synagogue, or other religious group.  A city, state, or national group that provides support for your health concern. Check your local library or community center for a list of these groups. Or look for information online.  Your local community, friends, and family.  Supportive relationships  A supportive relationship includes emotional support such as love, trust, and understanding, as well as advice and concrete help, such as help managing your time.  Reach out to others  Family and friends can help you. Ask them to:  Listen to you and give you encouragement. This can keep you from feeling hopeless or alone.  Help with small daily tasks or with bigger problems. A helping hand can keep you from feeling overwhelmed.  Help you manage a health problem. For example, ask them to go to doctor visits with you. Your loved ones can offer support by being involved in your medical care.  Respect your relationships  A good relationship is also a two-way street. You count on help from others, but they also count on you.  Know your friends' limits. You don't have to see or call your friends every day. If you are going through a rough patch, ask friends if you can contact them outside of the usual boundaries.  Don't always complain or talk about yourself. Know when it's time to stop talking and listen or  just enjoy your friend's company.  Know that good friends can be a bad influence. For example, if a friend encourages you to drink when you know it will harm you, you may want to end the friendship.  Where can you learn more?  Go to [email or web address] and enter G092 to learn more about "Learning About Emotional Support."  Current as of: June 24, 2023Content Version: 14.0   2006-2024 [employer], Inc[employer].   Care instructions adapted under license by [facility]. If you have questions about a medical  condition or this instruction, always ask your healthcare professional. Healthwise, Incorporated disclaims any warranty or liability for your use of this information.           Fatigue: Care Instructions  Overview     Fatigue is a feeling of tiredness, exhaustion, or lack of energy. You may feel fatigue because of too much or not enough activity. It can also come from stress, lack of sleep, boredom, and poor diet. Many medical problems, such as viral infections, can cause fatigue. Emotional problems, especially depression, are often the cause of fatigue.  Fatigue is most often a symptom of another problem. Treatment for fatigue depends on the cause. For example, if you have fatigue because you have a certain health problem, treating this problem also treats your fatigue. If depression or anxiety is the cause, treatment may help.  Follow-up care is a key part of your treatment and safety. Be sure to make and go to all appointments, and call your doctor if you are having problems. It's also a good idea to know your test results and keep a list of the medicines you take.  How can you care for yourself at home?  Get regular exercise. But try not to overdo it. It may help to go back and forth between rest and exercise.  Get plenty of rest.  Eat a variety of healthy foods. Try not to skip any meals.  Avoid or try to cut back on your use of caffeine, tobacco, and alcohol. Caffeine is most often found in coffee, tea, cola drinks, and energy drinks.  Limit medicines that can cause fatigue. These include medicines such as cold and allergy medicines.  When should you call for help?  Watch closely for changes in your health, and be sure to contact your doctor if:   You have new symptoms such as fever or a rash.    Your fatigue gets worse.    You have been feeling down, depressed, or hopeless. Or you may have lost interest in things that you usually enjoy.    You are not getting better as expected.   Where can you learn  more?  Go to [email or web address] and enter W864 to learn more about "Fatigue: Care Instructions."  Current as of: June 24, 2023Content Version: 14.0   2006-2024 [employer], Inc[employer].   Care instructions adapted under license by [facility]. If you have questions about a medical condition or this instruction, always ask your healthcare professional. Health[employer], Incorporated disclaims any warranty or liability for your use of this information.           Learning About Being Active as an Older Adult  Why is being active important as you get older?     Being active is one of the best things you can do for your health. And it's never too late to start. Being active--or getting active, if you aren't already--has definite benefits. It can:  Give you more energy,  Keep your  mind sharp.  Improve balance to reduce your risk of falls.  Help you manage chronic illness with fewer medicines.  No matter how old you are, how fit you are, or what health problems you have, there is a form of activity that will work for you. And the more physical activity you can do, the better your overall health will be.  What kinds of activity can help you stay healthy?  Being more active will make your daily activities easier. Physical activity includes planned exercise and things you do in daily life. There are four types of activity:  Aerobic.  Doing aerobic activity makes your heart and lungs strong.  Includes walking, dancing, and gardening.  Aim for at least 2 hours spread throughout the week.  It improves your energy and can help you sleep better.  Muscle-strengthening.  This type of activity can help maintain muscle and strengthen bones.  Includes climbing stairs, using resistance bands, and lifting or carrying heavy loads.  Aim for at least twice a week.  It can help protect the knees and other joints.  Stretching.  Stretching gives you better range of motion in joints and muscles.  Includes upper arm  stretches, calf stretches, and gentle yoga.  Aim for at least twice a week, preferably after your muscles are warmed up from other activities.  It can help you function better in daily life.  Balancing.  This helps you stay coordinated and have good posture.  Includes heel-to-toe walking, tai chi, and certain types of yoga.  Aim for at least 3 days a week.  It can reduce your risk of falling.  Even if you have a hard time meeting the recommendations, it's better to be more active than less active. All activity done in each category counts toward your weekly total. You'd be surprised how daily things like carrying groceries, keeping up with grandchildren, and taking the stairs can add up.  What keeps you from being active?  If you've had a hard time being more active, you're not alone. Maybe you remember being able to do more. Or maybe you've never thought of yourself as being active. It's frustrating when you can't do the things you want. Being more active can help. What's holding you back?  Getting started.  Have a goal, but break it into easy tasks. Small steps build into big accomplishments.  Staying motivated.  If you feel like skipping your activity, remember your goal. Maybe you want to move better and stay independent. Every activity gets you one step closer.  Not feeling your best.  Start with 5 minutes of an activity you enjoy. Prove to yourself you can do it. As you get comfortable, increase your time.  You may not be where you want to be. But you're in the process of getting there. Everyone starts somewhere.  How can you find safe ways to stay active?  Talk with your doctor about any physical challenges you're facing. Make a plan with your doctor if you have a health problem or aren't sure how to get started with activity.  If you're already active, ask your doctor if there is anything you should change to stay safe as your body and health change.  If you tend to feel dizzy after you take medicine, avoid  activity at that time. Try being active before you take your medicine. This will reduce your risk of falls.  If you plan to be active at home, make sure to clear your space before  you get started. Remove things like TV cords, coffee tables, and throw rugs. It's safest to have plenty of space to move freely.  The key to getting more active is to take it slow and steady. Try to improve only a little bit at a time. Pick just one area to improve on at first. And if an activity hurts, stop and talk to your doctor.  Where can you learn more?  Go to [email or web address] and enter P600 to learn more about "Learning About Being Active as an Older Adult."  Current as of: June 5, 2023Content Version: 14.0   2006-2024 [employer]wise, Incorporated.   Care instructions adapted under license by [facility]. If you have questions about a medical condition or this instruction, always ask your healthcare professional. Health[employer], Incorporated disclaims any warranty or liability for your use of this information.           Learning About Dental Care for Older Adults  Dental care for older adults: Overview  Dental care for older people is much the same as for younger adults. But older adults do have concerns that younger adults do not. Older adults may have problems with gum disease and decay on the roots of their teeth. They may need missing teeth replaced or broken fillings fixed. Or they may have dentures that need to be cared for. Some older adults may have trouble holding a toothbrush.  You can help remind the person you are caring for to brush and floss their teeth or to clean their dentures. In some cases, you may need to do the brushing and other dental care tasks. People who have trouble using their hands or who have dementia may need this extra help.  How can you help with dental care?  Normal dental care  To keep the teeth and gums healthy:  Brush the teeth with fluoride toothpaste twice a day--in  the morning and at night--and floss at least once a day. Plaque can quickly build up on the teeth of older adults.  Watch for the signs of gum disease. These signs include gums that bleed after brushing or after eating hard foods, such as apples.  See a dentist regularly. Many experts recommend checkups every 6 months.  Keep the dentist up to date on any new medications the person is taking.  Encourage a balanced diet that includes whole grains, vegetables, and fruits, and that is low in saturated fat and sodium.  Encourage the person you're caring for not to use tobacco products. They can affect dental and general health.  Many older adults have a fixed income and feel that they can't afford dental care. But most towns and cities have programs in which dentists help older adults by lowering fees. Contact your area's public health offices or social services for information about dental care in your area.  Using a toothbrush  Older adults with arthritis sometimes have trouble brushing their teeth because they can't easily hold the toothbrush. Their hands and fingers may be stiff, painful, or weak. If this is the case, you can:  Offer an Mining engineer toothbrush.  Enlarge the handle of a non-electric toothbrush by wrapping a sponge, an elastic bandage, or adhesive tape around it.  Push the toothbrush handle through a ball made of rubber or soft foam.  Make the handle longer and thicker by taping Popsicle sticks or tongue depressors to it.  You may also be able to buy special toothbrushes, toothpaste dispensers, and floss holders.  Your doctor may recommend  a soft-bristle toothbrush if the person you care for bleeds easily. Bleeding can happen because of a health problem or from certain medicines.  A toothpaste for sensitive teeth may help if the person you care for has sensitive teeth.  How do you brush and floss someone's teeth?  If the person you are caring for has a hard time cleaning their teeth on their own, you may  need to brush and floss their teeth for them. It may be easiest to have the person sit and face away from you, and to sit or stand behind them. That way you can steady their head against your arm as you reach around to floss and brush their teeth. Choose a place that has good lighting and is comfortable for both of you.  Before you begin, gather your supplies. You will need gloves, floss, a toothbrush, and a container to hold water if you are not near a sink. Wash and dry your hands well and put on gloves. Start by flossing:  Gently work a piece of floss between each of the teeth toward the gums. A plastic flossing tool may make this easier, and they are available at most drugstores.  Curve the floss around each tooth into a U-shape and gently slide it under the gum line.  Move the floss firmly up and down several times to scrape off the plaque.  After you've finished flossing, throw away the used floss and begin brushing:  Wet the brush and apply toothpaste.  Place the brush at a 45-degree angle where the teeth meet the gums. Press firmly, and move the brush in small circles over the surface of the teeth.  Be careful not to brush too hard. Vigorous brushing can make the gums pull away from the teeth and can scratch the tooth enamel.  Brush all surfaces of the teeth, on the tongue side and on the cheek side. Pay special attention to the front teeth and all surfaces of the back teeth.  Brush chewing surfaces with short back-and-forth strokes.  After you've finished, help the person rinse the remaining toothpaste from their mouth.  Where can you learn more?  Go to [email or web address] and enter F944 to learn more about "Learning About Dental Care for Older Adults."  Current as of: August 6, 2023Content Version: 14.0   2006-2024 Healthwise, Incorporated.   Care instructions adapted under license by [facility]. If you have questions about a medical condition or this instruction, always  ask your healthcare professional. Healthwise, Incorporated disclaims any warranty or liability for your use of this information.           Learning About Vision Tests  What are vision tests?     The four most common vision tests are visual acuity tests, refraction, visual field tests, and color vision tests.  Visual acuity (sharpness) tests  These tests are used:  To see if you need glasses or contact lenses.  To monitor an eye problem.  To check an eye injury.  Visual acuity tests are done as part of routine exams. You may also have this test when you get your driver's license or apply for some types of jobs.  Visual field tests  These tests are used:  To check for vision loss in any area of your range of vision.  To screen for certain eye diseases.  To look for nerve damage after a stroke, head injury, or other problem that could reduce blood flow to the brain.  Refraction and color  tests  A refraction test is done to find the right prescription for glasses and contact lenses.  A color vision test is done to check for color blindness.  Color vision is often tested as part of a routine exam. You may also have this test when you apply for a job where recognizing different colors is important, such as truck driving, Optician, dispensing, or the [employer].  How are vision tests done?  Visual acuity test   You cover one eye at a time.  You read aloud from a wall chart across the room.  You read aloud from a small card that you hold in your hand.  Refraction   You look into a special device.  The device puts lenses of different strengths in front of each eye to see how strong your glasses or contact lenses need to be.  Visual field tests   Your doctor may have you look through special machines.  Or your doctor may simply have you stare straight ahead while they move a finger into and out of your field of vision.  Color vision test   You look at pieces of printed test patterns in various colors. You say what number or symbol you  see.  Your doctor may have you trace the number or symbol using a pointer.  How do these tests feel?  There is very little chance of having a problem from this test. If dilating drops are used for a vision test, they may make the eyes sting and cause a medicine taste in the mouth.  Follow-up care is a key part of your treatment and safety. Be sure to make and go to all appointments, and call your doctor if you are having problems. It's also a good idea to know your test results and keep a list of the medicines you take.  Where can you learn more?  Go to [email or web address] and enter G551 to learn more about "Learning About Vision Tests."  Current as of: June 5, 2023Content Version: 14.0   2006-2024 [employer]wise, Incorporated.   Care instructions adapted under license by [facility]. If you have questions about a medical condition or this instruction, always ask your healthcare professional. Healthwise, Incorporated disclaims any warranty or liability for your use of this information.           Eating Healthy Foods: Care Instructions  With every meal, you can make healthy food choices. Try to eat a variety of fruits, vegetables, whole grains, lean proteins, and low-fat dairy products. This can help you get the right balance of nutrients, including vitamins and minerals. Small changes add up over time. You can start by adding one healthy food to your meals each day.    Try to make half your plate fruits and vegetables, one-fourth whole grains, and one-fourth lean proteins. Try including dairy with your meals.   Eat more fruits and vegetables. Try to have them with most meals and snacks.   Foods for healthy eating    Fruits    These can be fresh, frozen, canned, or dried.  Try to choose whole fruit rather than fruit juice.  Eat a variety of colors.    Vegetables    These can be fresh, frozen, canned, or dried.  Beans, peas, and lentils count too.    Whole grains    Choose whole-grain  breads, cereals, and noodles.  Try brown rice.    Lean proteins    These can include lean meat, poultry, fish, and eggs.  You can also have tofu, beans, peas, lentils, nuts, and seeds.    Dairy    Try milk, yogurt, and cheese.  Choose low-fat or fat-free when you can.  If you need to, use lactose-free milk or fortified plant-based milk products, such as soy milk.    Water    Drink water when you're thirsty.  Limit sugar-sweetened drinks, including soda, fruit drinks, and sports drinks.  Where can you learn more?  Go to [employer] and enter T756 to learn more about "Eating Healthy Foods: Care Instructions."  Current as of: September 20, 2023Content Version: 14.0   2006-2024 Healthwise, Incorporated.   Care instructions adapted under license by [facility]. If you have questions about a medical condition or this instruction, always ask your healthcare professional. Healthwise, Incorporated disclaims any warranty or liability for your use of this information.           Advance Directives: Care Instructions  Overview  An advance directive is a legal way to state your wishes at the end of your life. It tells your family and your doctor what to do if you can't say what you want.  There are two main types of advance directives. You can change them any time your wishes change.  Living will.  This form tells your family and your doctor your wishes about life support and other treatment. The form is also called a declaration.  Medical power of attorney.  This form lets you name a person to make treatment decisions for you when you can't speak for yourself. This person is called a health care agent (health care proxy, health care surrogate). The form is also called a durable power of attorney for health care.  If you do not have an advance directive, decisions about your medical care may be made by a family member, or by a doctor or a judge who doesn't know you.  It may help to think  of an advance directive as a gift to the people who care for you. If you have one, they won't have to make tough decisions by themselves.  For more information, including forms for your state, see the [employer] website ([employer]).  Follow-up care is a key part of your treatment and safety. Be sure to make and go to all appointments, and call your doctor if you are having problems. It's also a good idea to know your test results and keep a list of the medicines you take.  What should you include in an advance directive?  Many states have a unique advance directive form. (It may ask you to address specific issues.) Or you might use a universal form that's approved by many states.  If your form doesn't tell you what to address, it may be hard to know what to include in your advance directive. Use the questions below to help you get started.  Who do you want to make decisions about your medical care if you are not able to?  What life-support measures do you want if you have a serious illness that gets worse over time or can't be cured?  What are you most afraid of that might happen? (Maybe you're afraid of having pain, losing your independence, or being kept alive by machines.)  Where would you prefer to die? (Your home? A hospital? A nursing home?)  Do you want to donate your organs when you die?  Do you want certain religious practices performed before you die?  When should you call for  help?  Be sure to contact your doctor if you have any questions.  Where can you learn more?  Go to [employer] and enter R264 to learn more about "Advance Directives: Care Instructions."  Current as of: November 16, 2023Content Version: 14.0   2006-2024 Healthwise, Incorporated.   Care instructions adapted under license by [facility]. If you have questions about a medical condition or this instruction, always ask your healthcare professional. Healthwise,  Incorporated disclaims any warranty or liability for your use of this information.           A Healthy Heart: Care Instructions  Overview     Coronary artery disease, also called heart disease, occurs when a substance called plaque builds up in the vessels that supply oxygen-rich blood to your heart muscle. This can narrow the blood vessels and reduce blood flow. A heart attack happens when blood flow is completely blocked. A high-fat diet, smoking, and other factors increase the risk of heart disease.  Your doctor has found that you have a chance of having heart disease. A heart-healthy lifestyle can help keep your heart healthy and prevent heart disease. This lifestyle includes eating healthy, being active, staying at a weight that's healthy for you, and not smoking or using tobacco. It also includes taking medicines as directed, managing other health conditions, and trying to get a healthy amount of sleep.  Follow-up care is a key part of your treatment and safety. Be sure to make and go to all appointments, and call your doctor if you are having problems. It's also a good idea to know your test results and keep a list of the medicines you take.  How can you care for yourself at home?  Diet   Use less salt when you cook and eat. This helps lower your blood pressure. Taste food before salting. Add only a little salt when you think you need it. With time, your taste buds will adjust to less salt.    Eat fewer snack items, fast foods, canned soups, and other high-salt, high-fat, processed foods.    Read food labels and try to avoid saturated and trans fats. They increase your risk of heart disease by raising cholesterol levels.    Limit the amount of solid fat--butter, margarine, and shortening--you eat. Use olive, peanut, or canola oil when you cook. Bake, broil, and steam foods instead of frying them.    Eat a variety of fruit and vegetables every day. Dark green, deep orange, red, or yellow fruits and  vegetables are especially good for you. Examples include spinach, carrots, peaches, and berries.    Foods high in fiber can reduce your cholesterol and provide important vitamins and minerals. High-fiber foods include whole-grain cereals and breads, oatmeal, beans, brown rice, citrus fruits, and apples.    Eat lean proteins. Heart-healthy proteins include seafood, lean meats and poultry, eggs, beans, peas, nuts, seeds, and soy products.    Limit drinks and foods with added sugar. These include candy, desserts, and soda pop.   Heart-healthy lifestyle   If your doctor recommends it, get more exercise. For many people, walking is a good choice. Or you may want to swim, bike, or do other activities. Bit by bit, increase the time you're active every day. Try for at least 30 minutes on most days of the week.    Try to quit or cut back on using tobacco and other nicotine products. This includes smoking and vaping. If you need help quitting, talk to your  doctor about stop-smoking programs and medicines. These can increase your chances of quitting for good. Quitting is one of the most important things you can do to protect your heart. It is never too late to quit. Try to avoid secondhand smoke too.    Stay at a weight that's healthy for you. Talk to your doctor if you need help losing weight.    Try to get 7 to 9 hours of sleep each night.    Limit alcohol to 2 drinks a day for men and 1 drink a day for women. Too much alcohol can cause health problems.    Manage other health problems such as diabetes, high blood pressure, and high cholesterol. If you think you may have a problem with alcohol or drug use, talk to your doctor.   Medicines   Take your medicines exactly as prescribed. Call your doctor if you think you are having a problem with your medicine.    If your doctor recommends aspirin, take the amount directed each day. Make sure you take aspirin and not another kind of pain reliever, such as acetaminophen  (Tylenol).   When should you call for help?   Call 911 if you have symptoms of a heart attack. These may include:   Chest pain or pressure, or a strange feeling in the chest.    Sweating.    Shortness of breath.    Pain, pressure, or a strange feeling in the back, neck, jaw, or upper belly or in one or both shoulders or arms.    Lightheadedness or sudden weakness.    A fast or irregular heartbeat.   After you call 911, the operator may tell you to chew 1 adult-strength or 2 to 4 low-dose aspirin. Wait for an ambulance. Do not try to drive yourself.  Watch closely for changes in your health, and be sure to contact your doctor if you have any problems.  Where can you learn more?  Go to [employer] and enter F075 to learn more about "A Healthy Heart: Care Instructions."  Current as of: June 24, 2023Content Version: 14.0   2006-2024 [employer]wise[employer].   Care instructions adapted under license by [facility]. If you have questions about a medical condition or this instruction, always ask your healthcare professional. Healthwise, Incorporated disclaims any warranty or liability for your use of this information.      Personalized Preventive Plan for Shelly Perkins - 07/25/2022  Medicare offers a range of preventive health benefits. Some of the tests and screenings are paid in full while other may be subject to a deductible, co-insurance, and/or copay.    Some of these benefits include a comprehensive review of your medical history including lifestyle, illnesses that may run in your family, and various assessments and screenings as appropriate.    After reviewing your medical record and screening and assessments performed today your provider may have ordered immunizations, labs, imaging, and/or referrals for you.  A list of these orders (if applicable) as well as your Preventive Care list are included within your After Visit Summary for your review.    Other Preventive  Recommendations:    A preventive eye exam performed by an eye specialist is recommended every 1-2 years to screen for glaucoma; cataracts, macular degeneration, and other eye disorders.  A preventive dental visit is recommended every 6 months.  Try to get at least 150 minutes of exercise per week or 10,000 steps per day on a pedometer .  Order or download  the FREE "Exercise & Physical Activity: Your Everyday Guide" from [employer]. Call (319)186-4919 or search [employer] online.  You need 1200-1500 mg of calcium and 1000-2000 IU of vitamin D per day. It is possible to meet your calcium requirement with diet alone, but a vitamin D supplement is usually necessary to meet this goal.  When exposed to the sun, use a sunscreen that protects against both UVA and UVB radiation with an SPF of 30 or greater. Reapply every 2 to 3 hours or after sweating, drying off with a towel, or swimming.  Always wear a seat belt when traveling in a car. Always wear a helmet when riding a bicycle or motorcycle.

## 2022-07-25 NOTE — Assessment & Plan Note (Signed)
Continue with diet and exercise.  Continue with Crestor.

## 2022-07-25 NOTE — Assessment & Plan Note (Signed)
Likely due to iron deficiency due to blood loss or chronic inflammatory process in the body.  Advised her to increase water and food intake.  Take over-the-counter multivitamin with the Iron.

## 2022-07-25 NOTE — Assessment & Plan Note (Signed)
Continue with stretching exercises. Apply ice/heat. Take Tylenol as needed.  F/u with physiatry.

## 2022-07-28 ENCOUNTER — Inpatient Hospital Stay: Admit: 2022-07-28 | Payer: MEDICARE | Primary: Family Medicine

## 2022-07-28 ENCOUNTER — Telehealth

## 2022-07-28 DIAGNOSIS — E785 Hyperlipidemia, unspecified: Secondary | ICD-10-CM

## 2022-07-28 DIAGNOSIS — Z0000 Encounter for general adult medical examination without abnormal findings: Secondary | ICD-10-CM

## 2022-07-28 LAB — COMPREHENSIVE METABOLIC PANEL W/ REFLEX TO MG FOR LOW K
ALT: 21 U/L (ref 12–78)
AST: 20 U/L (ref 10–37)
Albumin: 3.9 g/dL (ref 3.4–5.0)
Alk Phosphatase: 82 U/L (ref 43–117)
BUN: 22 MG/DL (ref 7–22)
CO2: 24 mmol/L (ref 21–32)
Calcium: 9.4 MG/DL (ref 8.5–10.1)
Chloride: 103 mmol/L (ref 98–108)
Creatinine: 1.06 MG/DL (ref 0.55–1.10)
Est, Glom Filt Rate: 57 mL/min/{1.73_m2} — ABNORMAL LOW (ref >60)
Glucose: 107 mg/dL — ABNORMAL HIGH (ref 74–106)
Potassium: 4.4 mmol/L (ref 3.4–5.1)
Sodium: 133 mmol/L — ABNORMAL LOW (ref 136–145)
Total Bilirubin: 0.3 mg/dL (ref 0.00–1.00)
Total Protein: 7.4 g/dL (ref 6.4–8.2)

## 2022-07-28 LAB — CBC
Hematocrit: 21 % — ABNORMAL LOW (ref 37.0–47.0)
Hemoglobin: 6.4 g/dL — ABNORMAL LOW (ref 12.0–16.0)
MCH: 24 PG — ABNORMAL LOW (ref 27.0–31.0)
MCHC: 30.5 g/dL — ABNORMAL LOW (ref 33.0–37.0)
MCV: 78.7 FL — ABNORMAL LOW (ref 80.0–94.0)
MPV: 8.7 FL (ref 7.4–10.4)
Nucleated RBCs: 0 PER 100 WBC
Platelets: 372 10*3/uL (ref 130–400)
RBC: 2.67 M/uL — ABNORMAL LOW (ref 4.20–5.40)
RDW: 15.2 % — ABNORMAL HIGH (ref 11.5–14.5)
WBC: 6.2 10*3/uL (ref 4.5–10.9)
nRBC: 0 10*3/uL

## 2022-07-28 LAB — AMB EXT LIPID PANEL
HDL, EXTERNAL: 47
LDL Cholesterol, External: 63.4
TOTAL CHOLESTEROL, EXTERNAL: 133
TRIGLYCERIDES, EXTERNAL: 113

## 2022-07-28 LAB — LIPID PANEL
Chol/HDL Ratio: 2.8
Cholesterol, Total: 133 MG/DL (ref 0–199)
HDL: 47 MG/DL — ABNORMAL LOW (ref >50)
LDL Cholesterol: 63.4 MG/DL
Non-HDL Cholesterol: 86 mg/dL
Triglycerides: 113 MG/DL (ref <150)

## 2022-07-28 LAB — HEMOGLOBIN A1C
Estimated Avg Glucose: 114 mg/dL
Hemoglobin A1C: 5.6 % (ref <5.7)

## 2022-07-28 NOTE — Telephone Encounter (Signed)
Please make her appointment with Hematology or she can go to ER.

## 2022-07-28 NOTE — Telephone Encounter (Signed)
-----   Message from Cornelius Moras, MD sent at 07/28/2022  5:09 PM EDT -----  Please inform the patient that she is anemic with Hgb 6.4 and Hct 21. I am going to refer her to Hematology. If she feels dizzy or lightheaded then she can go to ER.

## 2022-07-28 NOTE — Telephone Encounter (Signed)
Lmx1 @ Pt home    Left detailed message re: abnormally low red blood cells and that Pt is advised to ED for further evaluation. Advised Pt to [facility] ED so they can see her labwork. Told Pt I would have our triage nurse follow up with her tomorrow morning.      Do we need to change referral to STAT/urgent?

## 2022-07-29 ENCOUNTER — Inpatient Hospital Stay
Admit: 2022-07-29 | Discharge: 2022-07-29 | Disposition: A | Discharge status: Home / Self Care | Payer: MEDICARE | Attending: Emergency Medicine

## 2022-07-29 DIAGNOSIS — D649 Anemia, unspecified: Secondary | ICD-10-CM

## 2022-07-29 LAB — CBC WITH AUTO DIFFERENTIAL
Basophils %: 1 % (ref 0–2)
Basophils Absolute: 0 10*3/uL (ref 0.0–0.1)
Eosinophils %: 2 % (ref 0–5)
Eosinophils Absolute: 0.1 10*3/uL (ref 0.0–0.4)
Hematocrit: 19.6 % — ABNORMAL LOW (ref 37.0–47.0)
Hemoglobin: 6 g/dL — ABNORMAL LOW (ref 12.0–16.0)
Immature Granulocytes %: 1 % — ABNORMAL HIGH (ref 0.0–0.6)
Immature Granulocytes Absolute: 0 10*3/uL (ref 0.00–0.04)
Lymphocytes Absolute: 1.5 10*3/uL (ref 1.2–3.7)
Lymphocytes: 25 % (ref 14–46)
MCH: 24 PG — ABNORMAL LOW (ref 27.0–31.0)
MCHC: 30.6 g/dL — ABNORMAL LOW (ref 33.0–37.0)
MCV: 78.4 FL — ABNORMAL LOW (ref 80.0–94.0)
MPV: 9.4 FL (ref 7.4–10.4)
Monocytes %: 7 % (ref 5–12)
Monocytes Absolute: 0.4 10*3/uL (ref 0.2–1.0)
Neutrophils Absolute: 3.8 10*3/uL (ref 1.6–6.1)
Nucleated RBCs: 0.3 PER 100 WBC
Platelets: 368 10*3/uL (ref 130–400)
RBC: 2.5 M/uL — ABNORMAL LOW (ref 4.20–5.40)
RDW: 15.4 % — ABNORMAL HIGH (ref 11.5–14.5)
Seg Neutrophils: 64 % (ref 47–80)
WBC: 5.8 10*3/uL (ref 4.5–10.9)
nRBC: 0.02 10*3/uL

## 2022-07-29 LAB — IRON PROFILE
Iron % Saturation: 3 % — ABNORMAL LOW (ref 15–50)
Iron: 13 ug/dL — ABNORMAL LOW (ref 50–170)
TIBC: 470 ug/dL — ABNORMAL HIGH (ref 250–450)

## 2022-07-29 LAB — RETICULOCYTES
Absolute Retic #: 0.0524 M/ul
Reticulocyte Count,Automated: 2.1 % (ref 0.5–2.6)

## 2022-07-29 LAB — VITAMIN B12: Vitamin B-12: 362 pg/mL (ref 193–986)

## 2022-07-29 LAB — PREPARE RBC (CROSSMATCH)

## 2022-07-29 LAB — FOLATE: Folate: 23.6 ng/mL — ABNORMAL HIGH (ref 3.1–17.5)

## 2022-07-29 MED ORDER — SODIUM CHLORIDE 0.9 % IV SOLN
0.9 | INTRAVENOUS | Status: DC | PRN
Start: 2022-07-29 — End: 2022-07-29

## 2022-07-29 MED ORDER — IRON 325 (65 FE) MG PO TABS
325 (65 Fe) MG | ORAL_TABLET | Freq: Every day | ORAL | 0 refills | Status: DC
Start: 2022-07-29 — End: 2023-06-26

## 2022-07-29 NOTE — Telephone Encounter (Signed)
Patient reports she is tired and gets winded easily. She is lightheaded when she stands up. She understands that her H&H is critical. She is having her husband drive her to the ED and I called and gave them report. She further understands an urgent referral to hematology-oncology was made by the provider last evening.

## 2022-07-29 NOTE — ED Notes (Signed)
Discharge instructions discussed with pt and significant other. Pt and significant other acknowledged all discharge instructions. Pt accepted discharge packet. Pt ambulated out of ED under own power.

## 2022-07-29 NOTE — Other (Signed)
Informed Consent for Blood Component Transfusion Note    I have discussed with the patient the rationale for blood component transfusion; its benefits in treating or preventing fatigue, organ damage, or death; and its risk which includes mild transfusion reactions, rare risk of blood borne infection, or more serious but rare reactions. I have discussed the alternatives to transfusion, including the risk and consequences of not receiving transfusion. The patient had an opportunity to ask questions and had agreed to proceed with transfusion of blood components.    Electronically signed by Graylin Shiver, MD on 07/29/22 at 2:02 PM EDT

## 2022-07-29 NOTE — ED Triage Notes (Signed)
Patient comes in by request of PCP to come to ER for a blood transfusion as HGB was 6.4.

## 2022-07-29 NOTE — ED Provider Notes (Signed)
Chief Complaint   Patient presents with    Abnormal Lab       HPI  Shelly Perkins is a 72 y.o. yo female who presents with husband for blood transfusion.  She was seen by her primary care provider yesterday.  Had routine lab work done and was found to be anemic.  Was sent to the ED for blood transfusion.  Patient states that she has been lightheaded with standing over the past couple of weeks.  She has been fatigued for the past several months.  She states over the past couple of weeks she is also noticed that when she uses her muscles she develops pain.  The example she gives his that when she holds the laundry she has soreness in her muscles.  This is atypical for her.  No chest pain or shortness of breath.  No change in baseline palpitations.  No falls or injury.  No sweatiness.  No swelling.  Does not take vitamins or supplements.  Eat a full regular diet that is not changed recently.  No recent change in no black or bloody stools.  No blood in her urine.  Not coughing up blood.  No increased bruising.  No fevers or chills.  No headache or body aches other than noted above.  No URI symptoms.  No nausea, vomiting or diarrhea.  Denies use of alcohol.  Is a smoker.  Has had Cologuard done.  Has never had a colonoscopy.  She and her husband both state that she has been looking pale for the past several months.  She does take Eliquis.      Past Medical History:  Past Medical History:   Diagnosis Date    Atrial fibrillation (HCC)     GERD (gastroesophageal reflux disease)     Hypertension        Past Surgical History:  Past Surgical History:   Procedure Laterality Date    APPENDECTOMY      CESAREAN SECTION      NECK SURGERY      fusion       Social History     Socioeconomic History    Marital status: Married     Spouse name: Not on file    Number of children: Not on file    Years of education: Not on file    Highest education level: Not on file   Occupational History    Not on file   Tobacco Use    Smoking status:  Every Day     Current packs/day: 0.25     Average packs/day: 0.3 packs/day for 50.4 years (12.6 ttl pk-yrs)     Types: Cigarettes     Start date: 1974    Smokeless tobacco: Never    Tobacco comments:     "Two packs per week" 07/25/22    Substance and Sexual Activity    Alcohol use: Never    Drug use: Never    Sexual activity: Not Currently   Other Topics Concern    Not on file   Social History Narrative    Not on file     Social Determinants of Health     Financial Resource Strain: Low Risk  (09/16/2021)    Overall Financial Resource Strain (CARDIA)     Difficulty of Paying Living Expenses: Not hard at all   Food Insecurity: Not on file (09/16/2021)   Transportation Needs: Unknown (09/16/2021)    PRAPARE - Therapist, art (Medical):  Not on file     Lack of Transportation (Non-Medical): No   Physical Activity: Inactive (07/25/2022)    Exercise Vital Sign     Days of Exercise per Week: 0 days     Minutes of Exercise per Session: 0 min   Stress: Not on file   Social Connections: Not on file   Intimate Partner Violence: Not on file   Housing Stability: Unknown (09/16/2021)    Housing Stability Vital Sign     Unable to Pay for Housing in the Last Year: Not on file     Number of Places Lived in the Last Year: Not on file     Unstable Housing in the Last Year: No       Family History:  Family History   Problem Relation Age of Onset    Cancer Father        Medications:  Patient medication list reviewed  Eliquis Tabs - 5 MG    No current facility-administered medications on file prior to encounter.     Current Outpatient Medications on File Prior to Encounter   Medication Sig Dispense Refill    pantoprazole (PROTONIX) 40 MG tablet Take 1 tablet by mouth in the morning and at bedtime      LORazepam (ATIVAN) 0.5 MG tablet Take 1 tablet by mouth nightly as needed.      metoprolol tartrate (LOPRESSOR) 25 MG tablet TAKE 1 TABLET TWICE DAILY 180 tablet 1    irbesartan (AVAPRO) 150 MG tablet TAKE 1 TABLET EVERY  NIGHT 90 tablet 1    apixaban (ELIQUIS) 5 MG TABS tablet TAKE 1 TABLET TWICE DAILY 180 tablet 1    rosuvastatin (CRESTOR) 10 MG tablet TAKE 1 TABLET EVERY DAY 90 tablet 3       PDMP Review:       No data to display                Allergies:    Patient has no known allergies.    Review of Systems: see HPI    Vitals:    07/29/22 1001 07/29/22 1002   BP: (!) 128/36    Pulse: 66    Resp:  16   Temp: 97.3 F (36.3 C)    SpO2:  99%   Weight: 43.1 kg (95 lb)    Height: 1.549 m (5\' 1" )      Physical Exam  General:  awake, alert, appropriate with exam  HEENT: PERRLA, EOMI, normal conjuctiva  Neck: supple  Nodes:  no cervical adenopathy  OP: mucosa moist, OP clear  Lungs: clear with good aeration; no wheezes, rales or ronchi  CV: RRR without murmur  Chest wall: No tenderness with palpation  Abd:  soft, NT, no HSM, no masses, normal BS  Back: No CVA tenderness and no midline tenderness  Rectal: Brown stool that is guaiac negative  Ext: warm, dry, well perfused without edema; pale nailbed  Skin: no acute rash  Neuro:  Grossly intact with fluent speech and normal gait  Pulses: 2+ = radial pulses    Additional information was gathered from the following independent historian(s):  - spouse or significant other  - the following record sources:  -- this EMR (internal records)  -- this EMR (Care Everywhere)      Medical Decision Making:  Shelly Perkins is a 72 y.o. yo female who presents with anemia.  On review of records she had a hemoglobin of 6.4.  She does not report any excessive blood  loss.  No sudden change in her diet.  Not vegetarian or vegan.  Other cell lines are not affected.  No adenopathy or splenomegaly or other findings concerning for malignancy or blood dyscrasia.  No bleeding elsewhere to suggest coagulopathy.  Symptomatic with lightheadedness with standing as well as muscle pain when she uses her muscles.  Also appears pale.  Will check a reticulocyte count, iron studies, folate and B12.  She does have a low MCV  and I suspect that she has iron deficiency.  Will ascertain the accuracy of yesterday's blood draw with hemoglobin 6.4 and transfuse as she is symptomatic.  Patient was given the opportunity to ask questions about her diagnosis and blood transfusion.  She is amenable to having a blood transfusion.  Written consent has been obtained.    ED Course:  ED Course as of 07/30/22 1049   Tue Jul 29, 2022   1156 Laboratory evaluation shows a hemoglobin of 6.  White count is normal at 5.8 platelet count is 368.  Iron is 13 with a% iron saturation of 3% and a TIBC of 470.  Reticulocyte count is 2.1. [KM]   1400 Folate is 23.2.  B12 is 362.  Patient is receiving blood.  Will be discharged with a diagnosis anemia.  She does have iron deficiency and will be started on iron.  She will need follow-up with her PCP for re-evaluation. [KM]   1529 Patient feels improved after 1st unit of blood.  She is getting her 2nd unit right now.  She is tolerating it without difficulty.  I have sent a prescription for iron to her mail-order pharmacy and made her aware of this.  She is being discharged home with a diagnosis of iron deficiency anemia and blood transfusion. [KM]   1536 The patient's care was or is anticipated to be transferred to me at change of shift.  The patient presented with anemia and need for transfusion.  There is not any concerned that she is acutely hemorrhaging.  Anticipated plan is discharge once transfusion complete without any complications.  Any new events/issues will be reported as they occur. [CS]      ED Course User Index  [CS] Molly Maduro, MD  [KM] Graylin Shiver, MD           Procedures    Vital Signs for this visit:  Patient Vitals for the past 12 hrs:   Temp Pulse Resp BP SpO2   07/29/22 1002 -- -- 16 -- 99 %   07/29/22 1001 97.3 F (36.3 C) 66 -- (!) 128/36 --       Lab findings during this visit (only abnormal values will be noted, if no value noted then the result was normal range):  Labs  Reviewed   IRON PROFILE   RETICULOCYTES   VITAMIN B12   FOLATE   CBC WITH AUTO DIFFERENTIAL   TYPE AND SCREEN   PREPARE RBC (CROSSMATCH)       Radiology studies during this visit and the past 24 hours:  No results found.    Medications given in the ED:  Medications   0.9 % sodium chloride infusion (has no administration in time range)       Diagnosis:  Final diagnoses:   None       Disposition:  DISPOSITION        Discharge prescriptions and/or changes if applicable:       Medication List        ASK your  doctor about these medications      Eliquis 5 MG Tabs tablet  Generic drug: apixaban  TAKE 1 TABLET TWICE DAILY     irbesartan 150 MG tablet  Commonly known as: AVAPRO  TAKE 1 TABLET EVERY NIGHT     LORazepam 0.5 MG tablet  Commonly known as: ATIVAN     metoprolol tartrate 25 MG tablet  Commonly known as: LOPRESSOR  TAKE 1 TABLET TWICE DAILY     pantoprazole 40 MG tablet  Commonly known as: PROTONIX     rosuvastatin 10 MG tablet  Commonly known as: CRESTOR  TAKE 1 TABLET EVERY DAY              Follow-up if applicable:  No follow-up provider specified.    Please note that portions of this document were created using the M*Modal Fluency Direct dictation system.  Any inconsistencies or typographical errors may be the result of mis-transcription that persist in spite of proof-reading and should be addressed with the document creator.        Graylin Shiver, MD  07/30/22 1049

## 2022-07-29 NOTE — Discharge Instructions (Signed)
Prescription for iron has been sent to your mail order pharmacy.  Please follow-up with your primary care provider.  Call the office tomorrow to schedule an appointment.

## 2022-07-30 LAB — TYPE AND SCREEN
ABO/Rh: O NEG
Antibody Screen: NEGATIVE
Blood Bank Blood Product Expiration Date: 202406052359
Blood Bank Blood Product Expiration Date: 202406082359
Blood Bank ISBT Product Blood Type: 5100
Blood Bank ISBT Product Blood Type: 5100
Blood Bank Unit Type and Rh: O POS
Blood Bank Unit Type and Rh: O POS
Status of Units: TRANSFUSED
Status of Units: TRANSFUSED
Unit Divison: 0
Unit Divison: 0
Unit Issue Date/Time: 202406041219
Unit Issue Date/Time: 202406041415

## 2022-07-31 ENCOUNTER — Telehealth

## 2022-07-31 NOTE — Telephone Encounter (Signed)
Spoke with patient who reports she noted what could have been blood in her stool. She has also been told once she had a bleeding ulcer. She has seen GI at [facility] in the past. We referred her to hematology. DW Dr. Marland Mcalpine and urgent referral to [facility] done for EGD and colon with follow up.

## 2022-08-20 NOTE — Telephone Encounter (Signed)
Routing to covering provider Laray Anger NP to review as Dr. Marland Mcalpine is out of office until after procedure.  Thank-you  --  Verlin Fester RN  (float nurse helping at [facility] ext# [phone]   today)

## 2022-08-20 NOTE — Telephone Encounter (Signed)
[facility] is calling    They need this patient to cleared to hold Eliquis for her EGD and Colonoscopy procedures.    The need a note stating when to hold, how long to hold and when to start back up.    They also want to know if she will need to retest her levels and when to do so?    This needs to be faxed to [facility] Attn: Scheduling at [phone]    A call back number for the Department is [phone] if this cannot be completed ASAP    The patients procedure is on Tuesday the 2nd

## 2022-08-22 NOTE — Telephone Encounter (Addendum)
Please advise for Dr. Marland Mcalpine patient to go to [facility] endo for both a colonoscopy and EGD on 08/26/22. Patient on eliquis 5 mg BID for a-fib. Typically we hold for 2 days prior and resume the day after. I created the order [facility] is requesting to be faxed to them at [phone]. Phone for [facility] in End[facility] is [phone] or the backline at [phone]. Given to Dr. Lorenz Coaster to sign for Dr. Marland Mcalpine.

## 2022-08-22 NOTE — Telephone Encounter (Signed)
Okay to hold two days prior to procedure.   Restart is up to provider doing the procedure, depending on any bleeding issues,   Don't understand the question about retesting?

## 2022-08-22 NOTE — Telephone Encounter (Signed)
Sent to Dr. Lorenz Coaster to address.

## 2022-08-29 LAB — HM COLONOSCOPY

## 2022-09-02 NOTE — Telephone Encounter (Signed)
[name].    If pt c/b, sched NPOV  w/any provider, for:  Chronic right-sided low back pain with right-sided sciatica                           .   Please ask Phys New questions & Send NP Welcome Letter. NO NP Info Packet [facility] PCP/Referring).    Found In Chart: (Please confirm w/patient that info below is most current, ie. Any recent back imaging in past 2 years?)    Prev Imaging: 05/16/20 [facility]: MRI Thoracic Spine, 11/07/19 [facility]: MRI Cervical Spine, 08/28/19 [facility]: MR Lumbar Spine   Pain Mgt:  [facility], [place] ([employer])-Dr Mariah Milling, last ov note 06/15/20    Thank you,   Shelly Perkins  09/02/2022  1:47 PM

## 2022-09-03 NOTE — Telephone Encounter (Signed)
Noted    Shelly Perkins

## 2022-09-03 NOTE — Telephone Encounter (Signed)
Patient scheduled 09/19/2022 at 2:00 with AS     Name and date of birth verified.  What is your reason or diagnosis for visit:  Chronic right-sided low back pain with right-sided sciatica   Is this going through the [place]?     NO       If YES, Do we have up to date referral for the correct body part?          Is this referral Workers Comp Related:      NO       If YES, What is the date of your injury       What is your [employer]?      Claim#:         Have you seen a pain management provider in the past:    Yes       If YES, What was the name of the provider:   Filomena Jungling     The office name:      [facility]    Have you had any imaging done w/in the past 2 years?   None not already listed          If YES, where:          Have you had any injections done in the past?    Yes         If YES, where:    [facility]      When:     21 and 24            What type of injection:     Left shoulder, Neck and mid back and lumbar spine      Have you ever seen a chiropractor for this issue?      No      If YES, where:           When:          Have you ever had physical therapy for this issue:     No       If YES, where:          When:             Please note our providers do not prescribe any opioid medications at this practice. Do you have any questions regarding this:        NO    As we prepare for you upcoming visit, our office will need your previous medical records including imaging reports and CD's prior to your scheduled visit on        Did scheduler request records?         Please note our office is a scent neutral zone, please refrain from wearing perfume, cologne, and/or other fragrances.

## 2022-09-04 NOTE — Telephone Encounter (Addendum)
Mailed NP packet and Welcome letter    09/09/22 called patient again and found out that [facility] is in [place], requested records and imaging      09/05/22 Left message asking patient to please call trying to clarify if the [facility] was in [place] so we can request medical records    Shelly Perkins

## 2022-09-09 MED ORDER — ELIQUIS 5 MG PO TABS
5 MG | ORAL_TABLET | Freq: Two times a day (BID) | ORAL | 1 refills | Status: AC
Start: 2022-09-09 — End: 2023-03-13

## 2022-09-09 NOTE — Telephone Encounter (Signed)
A. Date of last refill: 03/26/22  B. Date of last office visit pertaining to the medication being requested: 07/25/22  C. Prescription Refill Protocol:   "Medication: Eliquis  Last Office Visit: 1 year  Lab Monitoring: CBC, CMP annually, Check chart for duration of therapy  Category: Oral Anticoagulant  Length of refill: 1 year  Brand Name: Yes  Comments:"  D. Allergy List reviewed and Verified: Braylie Badami A Dylynn Ketner, CMA  E. Review of possible medication to medication interactions: Aydrien Froman A Najiyah Paris, CMA  F. Review last office visit pertaining to the medication being requested for adherence to assessment and plan: Ltanya Bayley A Gibbs Naugle, CMA  G. Review labs pertaining to the medication being requested. Determine if patient is due for an office visit or follow-up testing per protocol and document in patient's chart: Next visit: 07/28/2023

## 2022-09-18 ENCOUNTER — Ambulatory Visit: Payer: MEDICARE | Attending: Family Medicine | Primary: Family Medicine

## 2022-09-19 ENCOUNTER — Ambulatory Visit: Admit: 2022-09-19 | Discharge: 2022-09-19 | Payer: MEDICARE | Primary: Family Medicine

## 2022-09-19 DIAGNOSIS — M5441 Lumbago with sciatica, right side: Secondary | ICD-10-CM

## 2022-09-19 NOTE — Progress Notes (Signed)
[facility] Initial Consultation Note    Date of Service: 09/19/22  Patient Name: Shelly Perkins  Patient DOB: 1950-04-27  PCP: Shelly Moras, MD    Chief Complaint   Patient presents with    Back Pain        HPI:  Shelly Perkins is a 72 y.o. year old female with a PMH significant for tobacco abuse disorder, anemia, atrial fibrillation anticoagulated on Eliquis, who presents today as a new patient at the request of Dr. Marland Perkins for evaluation of chronic low back pain.    She is a previous patient at [facility] physiatry office, and has undergone injections at that office.  Upon review of records, she did undergo a left L5-S1 transforaminal epidural steroid injection in July of 2021 as well as a right L4-5 transforaminal epidural steroid injection in March of 2022.  She has since moved to [place] about a year ago and has not had any treatment since she was in [place].  Unfortunately, over the past two years her pain has worsened.    Onset : Around 5 years, worsening over the past two years   Location : low back, "heat radiating" down the right leg, right trunk "feels like a fire burning" down the back of the right leg to the toes, legs feel weak at times.  Severity : 7-8/10 on average   Aggravating: walking, stairs  Alleviating: laying down flat     Examples of what they have difficulty doing - walking, going up/down stairs, laundry   Examples of what they cannot do at all - walking     Treatments Tried:    Topicals : yes, helpful  Supplements :  no  Acetaminophen: yes, not helpful  NSAIDs: contraindicated with eliquis  Antiepileptics : no  Antidepressants : no  Muscle Relaxants : no  Steroids: no  Opioids:no  Marijuana: no    PT: no   TENS: no  Traction: no  Ultrasound: no  HEP: not currently  CBT: no  Chiropractic: no  OMT: no  Yoga: no  Acupuncture: no  Heat/Ice: no    Injections -     -s/p left L5-S1 TFESI 09/22/2019 with 65 to 75% improvement  -s/p right L4-5 TFESI 05/07/2020 with  temporary relief   -s/p right T9-10 TFESI 06/01/2020 with 30% relief      Surgery -  cervical fusion    The patient denies new numbness/tingling and weakness. The patient denies bladder/bowel dysfunction, urgency, and incontinence. The patient denies unexplained weight loss, fevers/chills, and night sweats. The patient denies new balance problems.    ROS:  See history of present illness and scanned intake form from today for review of systems and family history. 12 systems and family history were reviewed and are negative/unchanged unless otherwise noted in HPI or on intake form.    IMAGING:    PMH/PSxHx:  Past Medical History:   Diagnosis Date    Atrial fibrillation (HCC)     GERD (gastroesophageal reflux disease)     Hypertension      Past Surgical History:   Procedure Laterality Date    APPENDECTOMY      CESAREAN SECTION      NECK SURGERY      fusion       FAMILY HISTORY:  Family History   Problem Relation Age of Onset    Cancer Father        SOCIAL HISTORY:  Social History     Social History Narrative  Not on file       MEDICATIONS:    Current Outpatient Medications:     sodium-potassium-mag sulfate (SUPREP) 17.5-3.13-1.6 GM/177ML SOLN solution, MIX AND DRINK AS DIRECTED, Disp: , Rfl:     apixaban (ELIQUIS) 5 MG TABS tablet, TAKE 1 TABLET TWICE DAILY, Disp: 180 tablet, Rfl: 1    Ferrous Sulfate (IRON) 325 (65 Fe) MG TABS, Take 1 each by mouth daily, Disp: 90 tablet, Rfl: 0    pantoprazole (PROTONIX) 40 MG tablet, Take 1 tablet by mouth in the morning and at bedtime, Disp: , Rfl:     LORazepam (ATIVAN) 0.5 MG tablet, Take 1 tablet by mouth nightly as needed., Disp: , Rfl:     metoprolol tartrate (LOPRESSOR) 25 MG tablet, TAKE 1 TABLET TWICE DAILY, Disp: 180 tablet, Rfl: 1    irbesartan (AVAPRO) 150 MG tablet, TAKE 1 TABLET EVERY NIGHT, Disp: 90 tablet, Rfl: 1    rosuvastatin (CRESTOR) 10 MG tablet, TAKE 1 TABLET EVERY DAY, Disp: 90 tablet, Rfl: 3    ALLERGIES:  No Known Allergies    PE:  General/Constitutional:  Patient is seated comfortably in NAD, well-groomed.   Head/Eyes: Head atraumatic, EOMI   ENT: Hearing grossly normal   Respiratory: Breathing comfortably on RA, normal respiratory effort, no audible wheeze   Cardiovascular: 2+ peripheral radial and DP pulses, no LE edema noted   GI: abdomen non-distended, non-tender to palpation   GU: deferred   Skin: No appreciable rashes or skin breakdown   Hematologic/Lymphatic: no cervical or axillary lymphadenopathy noted   Psychiatric: Appropriate affect, A&Ox3 , answers questions appropriately, short and long term memory grossly normal, normal insight   Lumbar exam:    Musculoskeletal:    Inspection: Normal lumbar coronal and sagittal contours.    Stations:  Independent transfers from sitting to standing.    Gait:  Within normal limits.  Difficulty with heel walk    Palpation: No tenderness over bilateral lumbar paravertebral musculature.    Range of motion: Lumbar spine range of motion is full with pain on extension and rotation    Special tests:  Negative sacroiliac joint tests (distraction, compression, thigh thrust, Faber's, Patrick's, Production manager, Gaenslen's)    Neurological:    Motor: Five five throughout right and left lower extremities.  Normal tone without atrophy or abnormal movements.      Sensory: Grossly intact to light touch and pinprick throughout the right and left lower extremities.    DTRs:  3+ and symmetric at right and left patellar and Achilles.    Special tests: Negative right and left straight leg raise.  Right and left plantar responses are flexor nature.  No clonus about the ankles.   1. Chronic right-sided low back pain with right-sided sciatica  Assessment & Plan:   Keniah is a very pleasant 72 year old female with chronic low back pain with radiation to the right lower extremity.  Unfortunately, her pain has worsened over the past two years.  She is a previous patient of physiatry at a different practice, and is here to establish care  today.    No new imaging was available to review today.  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    On exam, her pain  appears to be an L5 radiculopathy on the right.  No weakness or any neurological red flag signs or symptoms are seen.    Patient has not had any recent conservative treatment.  I have recommended that she first initiate physical therapy to work on core strengthening as well as manual therapy to the thoracic region.    I will see her back in about 6-8 weeks to monitor for relief.  In the event that her pain persists, I will likely recommend updating her lumbar MRI.  Patient reports good relief from prior epidural steroid injections, and may like to proceed with this in the future.    2. Myalgia  Assessment & Plan:  Patient also has palpable trigger points in the right thoracic paraspinal muscles.  I have recommended that she initiate physical therapy to work on releasing these muscles.  She may be a candidate for trigger point injections in the future.      LEVEL OF SERVICE BASED ON TIME:  I personally spent a total of 45 minutes on today's visit doing chart preparation, review of previous records/labs/imaging, performing a medically appropriate evaluation, counseling and educating the patient, and documenting clinical information in the electronic health record.    A copy of this consultation note will be sent to the requesting provider.    CC:  Shelly Moras, MD  [address]  [place],  [place] [address]

## 2022-09-19 NOTE — Assessment & Plan Note (Addendum)
Patient also has palpable trigger points in the right thoracic paraspinal muscles.  I have recommended that she initiate physical therapy to work on releasing these muscles.  She may be a candidate for trigger point injections in the future.

## 2022-09-19 NOTE — Assessment & Plan Note (Addendum)
Shelly Perkins is a very pleasant 72 year old female with chronic low back pain with radiation to the right lower extremity in a right L5 pattern.  Unfortunately, her pain has worsened over the past two years.  She is a previous patient of physiatry at a different practice, and is here to establish care today.    No new imaging was available to review today.  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    On exam, her pain appears to be an L5 radiculopathy on the right.  No weakness or any neurological red flag signs or symptoms are seen.    Patient has not had any recent conservative treatment.  I have recommended that she first initiate physical therapy to work on core strengthening as well as manual therapy to the thoracic region.    I will see her back in about 6-8 weeks to monitor for relief.  In the event that her pain persists, I will likely recommend updating her lumbar MRI.  Patient reports good relief from prior epidural steroid injections, and may like to proceed with this in the future.

## 2022-10-08 ENCOUNTER — Inpatient Hospital Stay: Admit: 2022-10-08 | Payer: MEDICARE | Primary: Family Medicine

## 2022-10-08 NOTE — Progress Notes (Addendum)
Provider Number [id number]  Physical Therapy Initial Evaluation  Patient: Shelly Perkins (72 y.o. female)    DOB:  1950-04-20  Examination Date: 10/08/2022  Plan of Care Certification Period: 10/08/2022 to 12/03/22   MRN: [id number]  CSN: [id number] Visits from Start of Care:  1   Referring Provider: Vernell Leep, APRN - NP  Sec Prov:   PCP:  Cornelius Moras, MD   Insurance: Payor: [email or web address] MEDICARE / Plan: [employer] PLUS HMO / Product Type: *No Product type* /   Insurance ID: [id number] - (Medicare Managed) Secondary Insurance (if applicable):    Medical Diagnosis: Chronic right-sided low back pain with right-sided sciatica [M54.41, G89.29]  Treatment Diagnosis: Treatment Diagnosis: Right mid to low back pain that travels down the right LE  Problem Onset date: (4-5 years)     Medical History   Patient Assessed for Rehabilitation Services: Yes     Chart Reviewed: Yes    Medical History:   Past Medical History:   Diagnosis Date    Atrial fibrillation (HCC)     GERD (gastroesophageal reflux disease)     Hypertension    *pt. Reports she has a history of osteoporosis    Surgical History:   Past Surgical History:   Procedure Laterality Date    APPENDECTOMY      CESAREAN SECTION      NECK SURGERY      fusion       Medication:    Current Outpatient Medications:     sodium-potassium-mag sulfate (SUPREP) 17.5-3.13-1.6 GM/177ML SOLN solution, MIX AND DRINK AS DIRECTED, Disp: , Rfl:     apixaban (ELIQUIS) 5 MG TABS tablet, TAKE 1 TABLET TWICE DAILY, Disp: 180 tablet, Rfl: 1    Ferrous Sulfate (IRON) 325 (65 Fe) MG TABS, Take 1 each by mouth daily, Disp: 90 tablet, Rfl: 0    pantoprazole (PROTONIX) 40 MG tablet, Take 1 tablet by mouth in the morning and at bedtime, Disp: , Rfl:     LORazepam (ATIVAN) 0.5 MG tablet, Take 1 tablet by mouth nightly as needed., Disp: , Rfl:     metoprolol tartrate (LOPRESSOR) 25 MG tablet, TAKE 1 TABLET TWICE DAILY, Disp: 180 tablet, Rfl: 1    irbesartan (AVAPRO) 150 MG tablet, TAKE 1 TABLET  EVERY NIGHT, Disp: 90 tablet, Rfl: 1    rosuvastatin (CRESTOR) 10 MG tablet, TAKE 1 TABLET EVERY DAY, Disp: 90 tablet, Rfl: 3  Allergies: Patient has no known allergies.    SUBJECTIVE EXAMINATION   History obtained from:: Patient, Chart Review,      ,  Family/Caregiver Present: No    Subjective History of Present Problem/injury:   Subjective: Right mid to low back pain which can travel down the right LE.  Knee feels like the knee is on fire all teh time and can feel pain in the calf.  Alot of the leg is numb.    Additional Pertinent Hx:(if applicable):     Diagnostic Imaging/Tests:   CLINICAL DATA:  Lower back pain, worse on the right for 1 year.     EXAM:   MRI THORACIC SPINE WITHOUT CONTRAST     TECHNIQUE:   Multiplanar, multisequence MR imaging of the thoracic spine was   performed. No intravenous contrast was administered.     COMPARISON:  Chest CT 03/07/2020     FINDINGS:   Alignment: Exaggerated thoracic kyphosis with slight thoracic   levocurvature which is compensatory to lumbar dextroscoliosis by CT.   Facet mediated anterolisthesis at seen  at C7-T1 and T1-2. At T1-2   slip is measured at 5 mm.     Vertebrae: Discogenic endplate edema at W2-9 and T9-10, the levels   of most advanced disc degeneration. No fracture, discitis, or   aggressive bone lesion. Partial coverage of L1-2 shows prominent   sclerosis with degenerative appearance by prior CT. Multilevel ACDF.     Cord: Mild degenerative cord flattening at C7-T1 and T1-2. No cord   edema. No masslike finding in the thecal sac.     Paraspinal and other soft tissues: Negative     Disc levels:     Advanced disc narrowing with endplate degeneration in the upper and   midthoracic levels with better preserved disc height at T10-11 to   T12-L1. Discogenic endplate edema at F6-2 and T9-10 as noted above.   Multilevel facet spurring greatest at C7-T1 and T1-2 where there is   anterolisthesis.     Spinal stenosis most notable at C7-T1 and T1-2 where there is mild    cord mass effect on sagittal images.     Biforaminal stenosis at T1-2 and C7-T1 due to slip, disc height   loss, and spurring. Ankylosis may have occurred at C7-T1. ACDF   hardware screws extend to this severely narrowed disc space by CT.     Notable right foraminal impingement at T9-10 due to eccentric disc   protrusion.     IMPRESSION:   1. Generalized, advanced disc degeneration with discogenic endplate   edema at T7-8 and T9-10.   2. 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   3. Spinal stenosis with mild cord flattening at C7-T1 (where there   may be ankylosis) and T1-2. Biforaminal impingement is seen at both   of these levels.   4. T9-10 right foraminal impingement due to disc protrusion.     Previous PT/Other Services:   "has had a bunch of injections", not sure what these were, most recent shots did not help  Condition Since Onset:   gradually improving  Other Comments:      Patient Reported Goals:  Improve pain, wants to be able to walk and go up and down stairs, wants to be able to sleep better   Pain Screening:    Right thoracic and lumbar spine 8/10 current, 8/10 best 8/10 at most  This can wrap around to the abdomen  Stairs, walking, standing any length of time, sitting down any length of time all increase pain  Nothing she notes help it    Right knee 8/10 current best and worst    Right calf 4/10 current, 3/10 best, 6/10 at most    Reports both feet burn all the time     Functional Status:      Social Hx / Living situation:   Social History  Lives With: Spouse  Type of Home: House  Home Layout: One level (Has a basment)  Home Access: Stairs to enter with rails  Entrance Stairs - Rails: Right  Entrance Stairs - Number of Steps: 3       Occupation/Interests:   Occupation: Retired  Leisure & Hobbies: Stay home and watch TV, just started camping     Prior Level of Function:   Chronic deficits          Current Level of Function:   Difficulty standing to long,  difficulty sitting to long, difficulty with stairs, difficulty with walk, difficulty with sleeping  OBJECTIVE EXAMINATION:     Lumbar Spine Exam   Restrictions/Precautions:   Restrictions/Precautions: Other (comment) (A-fib, reported history of osteoporosis)          Observations:  (including posture and alignment)      Anterior pelvic tilt, increased lumbar lordosis, left iliac crest slightly elevated vs. right    Palpation:    Increased tone right lumbar and thoracic errector spinae, increased toen QL, right rib cab appears to be elevated and flaired vs. left    Neuro Screen:   L2 right diminished vs. Left, L3-4 intact and symmetrical, L5-S1 right diminished vs. Left with reports of "tingling", S2 intact and symmetrical     Spinal ROM:  []     Not assessed     []      Within Functional limits    []    WFL except  the following          (REF.)   % of normal or degrees.   Comments          Forward Flexion  (90) To floor            Lumbar Extension  (35)         (Right) Lateral Flexion   (30) 1" above knee joint        (Left) Lateral Flexion   (30)  1" above knee joint       (Right) Rotation   (30) 75%        (Left) Rotation   (30) 75%   painful in right mid/low back with with testing          Straight Leg Raise  (80)              Hip Flexion  (120)              Hip Extension  (30)          Strength Testing:  []     Not assessed        []     Within Functional limits    []    WFL except  the following   (0 to 5 Scale)           Ref.  RIGHT  LEFT  Comments           Hip Flexion  L1,2,3  3+  3+             Hip Extension  L5                 Hip Internal Rotation  L1,2,3           Hip External Rotation  L5,S1  4+  4+       Hip Abduction seated  L5,S1  Strong  Strong       Hip Adduction seated  L1,2  Strong  Strong       Knee Flexion (H.S)  L5,S1  4+  4+       Knee Extension (quad)  L3,4  5  5   increased pain on right with testing     Ankle Dorsiflexion  L4,5  4  4       Ankle Plantar flexion  L5,S1  5  5        Ankle Inversion  L4,5          Ankle Eversion  L5,S1           Toe Extension (gross)  L5  Strong  Weak  Toe Flexion (Gross)  S1               Lower Extremity ROM:  []     Not assessed     []      Within Functional limits    []    WFL except  the following                                                                                    RIGHT                                                LEFT             (REF.)  AROM  PROM    AROM  PROM             Straight Leg Raise  (0-80)                       Hip Flexion  (0-120)    WNL     WNL      Hip Extension  (0-30)               Hip Internal Rotation  (0-45)   Moderate limitation      Moderate limitation      Hip External Rotation  (0-45)    Mild limitation     Moderate limitation      Hip Abduction  (0-40)    WNL      WNL     Hip Adduction  (0-30)    WNL      WNL     Knee Flexion  (0-140)    WNL     WNL      Knee Extension  (0)    WNL      WNL     Ankle Dorsiflexion  (0-20)   WNL      WNL     Ankle Plantar Flexion  (0-50)               Ankle Inversion  (0-35)             Ankle Eversion  (0-15)                       Toes                    Muscle Length/Flexibility:    HS flexibility WNL trending towards hyper flexibility    Joint Mobility (if applicable):    Lumbar spine, feels good, though mobility WNL,  rib mobility symmetrical    Special Tests (if applicable):    Quadrant Test negative right back pain only, left negative  Slump Test positive right, negative left  Thomas Test positive bilat.  Lumbar Distraction decrease pain        Functional Outcome Measures/questionnaire(s) completed:  Modified Oswestry Disability Index (Mod ODI)  Pain Intensity: Pain medication provides me with little relief from pain.  Personal Care: I can take care of myself normally, but it increases my pain.  Lifting: Pain prevents me from lifting heavy weights, but I can manage light to medium weights if they are conveniently positioned.  Walking:  Pain prevents me from walking more than 1 mile. (1 mile   Sitting: Pain prevents me from sitting for more than 1/2 hour.  Standing: Pain prevents me from standing for more than 30 minutes.  Sleeping: Even when I take medication, I sleep less than 6 hours.  Social Life: My social life is normal, but it increases my level of pain.  Traveling: My pain restricts my travel over 2 hours.  Employment/Homemaking: My normal homemaking/job activities increase my pain, but I can still perform all that is required of me.  Modified Oswestry Disability Total Scores: 21  Number of Sections Completed: 10  Oswestry Disability Scores %: 42 % Disability     Current Total Disability Percentage: 42 % Disability % (Date: 10/08/2022)    Interpretation of Score: Higher score indicates symptoms are impacting life and patient is more disabled from back pain. Minimum Detectable Change (90% confidence): 10% points (Change of less than this amount may be attributed to error in the measurement.)         ASSESSMENT     Impression: Based on today's findings Shelly Perkins (72 y.o. female) presents with mid and low back pain with symptoms down her LE.  She also has right knee pain.  Her exam is positive for right sided muscle spasming, increased rib cage expansion of the right vs. Left, decreased hip mobility, decreased spinal rotation, but hyper flexibility with forward flexion, positive slump test, an increased lumbar lordosis, an anterior pelvic tilt and hip weakness.  We should work on hip and quad strengthening to assist with her right knee pain, and work on spinal mobility exercises for rotation, core stabilization, hip strengthening, traction and modalities to assist with her low back pain       Performance Deficits/impairment List:           Body Structures, Functions, Activity Limitations Requiring Skilled Therapeutic Intervention: Decreased functional mobility , Decreased ADL status, Decreased ROM, Decreased body mechanics, Decreased  strength, Decreased posture, Increased pain    This Patient will benefit from skilled physical therapy services to address the noted physical impairments, functional limitations, and to increase the likelihood of meeting the related goals stated below.        Patient's Activity Tolerance: Patient tolerated evaluation without incident, Patient tolerated treatment well      Patient's rehabilitation potential/prognosis is considered to be: Good  Factors which may impact rehabilitation potential include:   Age, Chronicity of problem, Medical co-morbidities        Eval Complexity: Overall Evaluation : Medium  Decision Making: Medium Complexity  History: Personal Factors and/or Comorbidities Impacting POC: Medium  Examination of body system(s) including body structures and functions, activity limitations, and/or participation restrictions: High  Clinical Presentation: Medium  Short Term Goals:  by 4 weeks  Shelly Perkins will report right knee pain as 4/10 at most to improve walking tolerance  Shelly Perkins will report low back pain as 5/10 at most to improve quality of life  Long Term Goals:  by 8 weeks  Shelly Perkins will report right knee pain as 2/10 at most with periods where she has no pain, improving walking tolerance  Shelly Perkins will have  hip mobility WNL to decrease strain to low back helping to improve pain  Shelly Perkins will report right low back pain as 3/10 at most with periods where she is pain free  Shelly Perkins will score 15% or better on her ModODI to show functional improvement  Shelly Perkins will be able to walk as far as needed/desired for physical and mental health benefits as well as improve access to community areas       TREATMENT PLAN   Treatment Plan:  Treatment may include any combination of the following:      Current Treatment Recommendations: Strengthening, ROM, Therapeutic activities, Modalities, Home exercise program, Manual, Pain management, Neuromuscular re-education  Modalities: Heat/Cold, E-stim - unattended     Pt.  actively involved in establishing Plan of Care and Goals: Yes  Frequency / Duration: Patient to be seen 2 (30) time(s) per wk for 8 weeks      Specific Instructions for Next Treatment: Core strengthening, hip mobility, glue strengthening, traction        Treatment Included on Day of Evaluation:   Home Exercise Program:  []  Issued  []  Verbal   []  Written Handout []  Return Demonstration    Therapeutic Exercise:    Therapeutic Activities:    Neuromuscular Reeducation:    Modalities:    Manual treatments:  Manual lumbar traction x6 reps hold 1 minute release 10 sec    Patient/ Caregiver education and instruction: Plan of Care, Evaluative findings             Therapist Signature:    William Hamburger, PT, 10/08/2022 Start Time: 1:31 PM    End Time: 2:10 PM                                             Evaluation Minutes:  30  Treatment Minutes: 9  Untimed Minutes:  Total Time: 39   Time Breakdown:  Modalities:   MT: 9  TE:    TA:   GT:   NMRE          I certify that the above Therapy Services are being furnished while the patient is under my care. I agree with the treatment plan and certify that this therapy is necessary.      Physician's Signature:_______________________________________          Date:________________     Physician Comments: _______________________________________________    Please sign and return to [facility]: Fax: [phone].   THANK YOU for this referral!

## 2022-10-10 ENCOUNTER — Inpatient Hospital Stay: Admit: 2022-10-10 | Payer: MEDICARE | Primary: Family Medicine

## 2022-10-10 NOTE — Progress Notes (Signed)
Provider Number [id number]  Physical Therapy Treatment Note  Patient: Shelly Perkins (72 y.o. female)    DOB:  10-01-1950  Treatment Date: 10/10/2022  Plan of Care/Certification Expiration Date: 12/03/22     Referring Provider: Vernell Leep, APRN - NP  Sec Prov:   Start of Care Date: 10/08/2022   Insurance: Payor: HUMANA MEDICARE / Plan: Francine Graven GOLD PLUS HMO / Product Type: *No Product type* /   Secondary Insurance (if applicable):  Visits from Start of Care:  2     Appointment CX/NS History:  No data recorded Progress Note Due:  Progress Note Due Date: 11/05/22        Medical Diagnosis: No admission diagnoses are documented for this encounter.  Treatment Diagnosis:   Treatment Diagnosis: Right mid to low back pain that travels down the right LE    Problem Onset date:   Onset Date:  (4-5 years)          Subjective:     Any medical or medication changes? No         HEP Compliance: Yes      Pain Level:  8/10      Patient Comments:   Back is not doing great today.  Having some extra pain.  Condition improvement: stable     Objective:    Restrictions/Precautions: Restrictions/Precautions: Other (comment) (A-fib, reported history of osteoporosis)        Treatment:      Therapeutic Exercise:     Scifit 5 minutes  P press x10 YTB (harder with tension on right)  Bridges 2x10   Leg fall out 2x10 RTB (needs cuing to tighten core to avoid pelvis tilting to side)  SLR x10 b/l    Manual treatments:    Manual lumbar traction x8 minute holds w/ 10 sec rest    Patient/ Caregiver Education and Instruction:   exercises       Assessment:    Post-Treatment Pain Level:   "about the same, stretching feels good though"  Patient's Activity Tolerance:     good  tolerated the activities well with no complications.  Overall progress is considered to be: Fair   Patient will continue to benefit from skilled PT services.     Conditions requiring continued skilled PT services: Body Structures, Functions, Activity Limitations Requiring Skilled  Therapeutic Intervention: Decreased functional mobility ; Decreased ADL status; Decreased ROM; Decreased body mechanics; Decreased strength; Decreased posture; Increased pain      Factors which may impact rehabilitation potential include:       Plan:   Continue Therapy current plan of care      Therapist Electronic Signature:    [place] Taneisha Fuson, PTA, 10/10/2022 Start Time:  4:30 PM EDT    End Time: 5:00 PM   Timed Minutes:  30  Untimed Minutes:   Total Treatment Time: 30   Time Breakdown:   MT: 10    TE: 20

## 2022-10-19 ENCOUNTER — Encounter

## 2022-10-22 MED ORDER — IRBESARTAN 150 MG PO TABS
150 | ORAL_TABLET | ORAL | 3 refills | Status: DC
Start: 2022-10-22 — End: 2024-02-22

## 2022-10-22 NOTE — Telephone Encounter (Signed)
 Medications Requested:  Requested Prescriptions     Pending Prescriptions Disp Refills    irbesartan  (AVAPRO ) 150 MG tablet [Pharmacy Med Name: IRBESARTAN  150 MG Tablet] 90 tablet 3     Sig: TAKE 1 TABLET EVERY NIGHT       Preferred Pharmacy:   [facility] - [place], [place] - [address] - P [phone] - F [phone]  [address]  [place] [address]  Phone: [phone] Fax: [phone]      Date of Last Refill: 05/26/23    Prescription Refill Protocol reviewed: Yes    Allergy List Reviewed and Verified: Yes    Possible medication to medication interactions reviewed: Yes    Last appt @ PCP Office: 07/25/2022     Future Appointments   Date Time Provider Department Center   10/28/2022  2:00 PM Dumais, Chad, [place] [facility]   10/30/2022  2:30 PM Okey Donnice PARAS, PT [facility]   10/31/2022  1:00 PM Siulinski, Neil LABOR, APRN - NP PHYS [facility]   11/04/2022  2:00 PM Kennon Beckey PARAS, PTA [facility]   11/07/2022  2:00 PM Dumais, Chad, PTA [facility]   11/20/2022  1:00 PM SML CT 1 SMLRCT [facility]   07/28/2023  3:00 PM Sherrill Deatrice LABOR, MD [facility]       MOST RECENT BLOOD PRESSURES  BP Readings from Last 3 Encounters:   09/19/22 120/60   07/29/22 (!) 160/59   07/25/22 (!) 110/58         MOST RECENT LAB DATA  Lab Results   Component Value Date/Time    K 4.4 07/28/2022 08:33 AM    ALT 21 07/28/2022 08:33 AM    CHOL 133 07/28/2022 08:33 AM    HGB 6.0 07/29/2022 10:41 AM    HCT 19.6 07/29/2022 10:41 AM    INR 1.0 06/30/2021 12:58 PM

## 2022-10-28 ENCOUNTER — Inpatient Hospital Stay: Admit: 2022-10-28 | Payer: MEDICARE | Primary: Family Medicine

## 2022-10-28 DIAGNOSIS — M5441 Lumbago with sciatica, right side: Secondary | ICD-10-CM

## 2022-10-28 NOTE — Progress Notes (Signed)
 Provider Number [id number]  Physical Therapy Treatment Note  Patient: Shelly Perkins (72 y.o. female)    DOB:  1951-01-07  Treatment Date: 10/28/2022  Plan of Care/Certification Expiration Date: 12/03/22     Referring Provider: Siulinski, Alisa A, APRN - NP  Sec Prov:   Start of Care Date: 10/08/2022   Insurance: Payor: HUMANA MEDICARE / Plan: MYLENE GOLD PLUS HMO / Product Type: *No Product type* /   Secondary Insurance (if applicable):  Visits from Start of Care:  3     Appointment CX/NS History:  No data recorded Progress Note Due:  Progress Note Due Date: 11/05/22        Medical Diagnosis: No admission diagnoses are documented for this encounter.  Treatment Diagnosis:   Treatment Diagnosis: Right mid to low back pain that travels down the right LE    Problem Onset date:   Onset Date:  (4-5 years)          Subjective:     Any medical or medication changes? No         HEP Compliance: Yes      Pain Level:  8/10      Patient Comments:   Knee is bothering today, does not want to do the sci fit again. Traction felt good last visit.  Condition improvement: not changed     Objective:    Restrictions/Precautions: Restrictions/Precautions: Other (comment) (A-fib, reported history of osteoporosis)        Treatment:      Therapeutic Exercise:     P press x10 YTB   Bridges 2x10   Leg fall out 2x10 GTB   Seated hip flx 2x10 GTB b/l (easier w/ R)  Supine hip flexor stretch 2x30 sec b/l  Supine piriformis stetch 2x30 sec b/l    Manual treatments:    Manual lumbar traction x8 minute holds w/ 10 sec rest    Patient/ Caregiver Education and Instruction:   exercises     Home Exercise Program: Verbal discussion/Education    Assessment:    Post-Treatment Pain Level:   back feels good right now, love the traction  Patient's Activity Tolerance:     good  tolerated the activities well with no complications.  Overall progress is considered to be: Good   Patient will continue to benefit from skilled PT services.     Conditions requiring  continued skilled PT services: Body Structures, Functions, Activity Limitations Requiring Skilled Therapeutic Intervention: Decreased functional mobility ; Decreased ADL status; Decreased ROM; Decreased body mechanics; Decreased strength; Decreased posture; Increased pain      Factors which may impact rehabilitation potential include:       Plan:   Continue Therapy current plan of care      Therapist Electronic Signature:    Ellora Varnum, PTA, 10/28/2022 Start Time:  2:00 PM EDT    End Time: 2:20 PM   Timed Minutes:  29  Untimed Minutes:   Total Treatment Time: 29   Time Breakdown:  Modalities:   MT: 10    TE: 19

## 2022-10-30 ENCOUNTER — Inpatient Hospital Stay: Admit: 2022-10-30 | Payer: MEDICARE | Primary: Family Medicine

## 2022-10-30 NOTE — Progress Notes (Signed)
 Provider Number [phone]  Physical Therapy Treatment Note  Patient: Shelly Perkins (72 y.o. female)    DOB:  08/07/1950  Treatment Date: 10/30/2022  Plan of Care/Certification Expiration Date: 12/03/22     Referring Provider: Siulinski, Alisa A, APRN - NP  Sec Prov:   Start of Care Date: 10/08/2022   Insurance: Payor: HU[employer]NA MEDICARE / Plan: [employer] GOLD PLUS HMO / Product Type: *No Product type* /   Secondary Insurance (if applicable):  Visits from Start of Care:  4     Appointment CX/NS History:  No data recorded Progress Note Due:  Progress Note Due Date: 11/05/22        Medical Diagnosis: No admission diagnoses are documented for this encounter.  Treatment Diagnosis:   Treatment Diagnosis: Right mid to low back pain that travels down the right LE    Problem Onset date:   Onset Date:  (4-5 years)          Subjective:     Any medical or medication changes? No         HEP Compliance: Yes      Pain Level:  8/10     Patient Comments:   Back is sore today, 8/10. Overall feels about the same.  Traction has provided temporary relief continues to have right LE and knee apin  Condition improvement: stable     Objective:    Restrictions/Precautions: Restrictions/Precautions: Other (comment) (A-fib, reported history of osteoporosis)          Treatment:      Therapeutic Exercise:     Standing hip abduction 1x10 yellow band each side  Hip Extension 1x10 yellow band each side    Supine Posterior Pelvic tilt x20 hold 5 sec  Supine Posterior Pelvic tilt with SLR x10 each side  Band Bridge 2x10  Standing hip Flexor stretch 2x30 sec each   Neutral to cat x10    Therapeutic Activities:        Neuromuscular Reeducation:      Modalities:      Manual treatments:    Traction 5 reps hold 1 minute release 10 sec    Patient/ Caregiver Education and Instruction:   Cues for exercises    Home Exercise Program:  No change    Assessment:    Post-Treatment Pain Level:   5/10  Patient's Activity Tolerance:     good    Overall progress is  considered to be: Minimal   Patient will continue to benefit from skilled PT services.     Conditions requiring continued skilled PT services: Body Structures, Functions, Activity Limitations Requiring Skilled Therapeutic Intervention: Decreased functional mobility ; Decreased ADL status; Decreased ROM; Decreased body mechanics; Decreased strength; Decreased posture; Increased pain    Improvements with traction appear to be temporary at this time. Recommendation is to add traction post treatment to assist with improvement.      Plan:   Consider adding E-STIM post treatment if traction continues to be only Surveyor, Quantity:    DONNICE JINNY GULL, PT, 10/30/2022 Start Time: 2:37 PM    End Time: 3:06 PM   Timed Minutes:  29  Untimed Minutes:   Total Treatment Time: 29   Time Breakdown:  Modalities:  MT: 8    TE: 21    TA:    GT:    NMRE

## 2022-10-31 ENCOUNTER — Ambulatory Visit: Admit: 2022-10-31 | Discharge: 2022-10-31 | Payer: MEDICARE | Primary: Family Medicine

## 2022-10-31 VITALS — BP 120/60 | HR 70 | Resp 16 | Ht 62.0 in | Wt 95.0 lb

## 2022-10-31 DIAGNOSIS — M5441 Lumbago with sciatica, right side: Secondary | ICD-10-CM

## 2022-10-31 NOTE — Progress Notes (Signed)
 [facility] Follow Up Note    Date of Service: 10/31/22  Patient Name: Shelly Perkins  Patient DOB: 1950/06/15  PCP: Sherrill Deatrice LABOR, MD    Chief Complaint   Patient presents with    Back Pain        HPI:  Shelly Perkins is a 72 y.o. year old female with a PMH significant for  tobacco abuse disorder, anemia, atrial fibrillation anticoagulated on Eliquis  who presents today for follow up regarding low back pain.  Last seen in this office on 09/19/22, and at that time,  physical therapy was recommended.  She has been through about 4 weeks of PT and denies any improvement of her pain.     She reports her pain is a 7/10 today to the right low back, with radiation down the right leg.  She was also getting right knee pain, which she feels is unrelated to her back.  She continues to have numbness and tingling to bilateral feet.  Denies any weakness.    The patient denies new numbness/tingling and weakness. The patient denies bladder/bowel dysfunction, urgency, and incontinence. The patient denies unexplained weight loss, fevers/chills, and night sweats. The patient denies new balance problems.    ROS:  Please see HPI.    IMAGING:    PMH:  Past Medical History:   Diagnosis Date    Atrial fibrillation (HCC)     GERD (gastroesophageal reflux disease)     Hypertension      Past Surgical History:   Procedure Laterality Date    APPENDECTOMY      CESAREAN SECTION      NECK SURGERY      fusion       PFHx:  Family History   Problem Relation Age of Onset    Cancer Father        SOCIAL HISTORY:  Social History     Social History Narrative    Not on file       MEDICATIONS:    Current Outpatient Medications:     irbesartan  (AVAPRO ) 150 MG tablet, TAKE 1 TABLET EVERY NIGHT, Disp: 90 tablet, Rfl: 3    sodium-potassium-mag sulfate (SUPREP) 17.5-3.13-1.6 GM/177ML SOLN solution, MIX AND DRINK AS DIRECTED, Disp: , Rfl:     apixaban  (ELIQUIS ) 5 MG TABS tablet, TAKE 1 TABLET TWICE DAILY, Disp: 180 tablet, Rfl: 1     Ferrous Sulfate (IRON ) 325 (65 Fe) MG TABS, Take 1 each by mouth daily, Disp: 90 tablet, Rfl: 0    pantoprazole  (PROTONIX ) 40 MG tablet, Take 1 tablet by mouth in the morning and at bedtime, Disp: , Rfl:     LORazepam  (ATIVAN ) 0.5 MG tablet, Take 1 tablet by mouth nightly as needed., Disp: , Rfl:     metoprolol  tartrate (LOPRESSOR ) 25 MG tablet, TAKE 1 TABLET TWICE DAILY, Disp: 180 tablet, Rfl: 1    rosuvastatin  (CRESTOR ) 10 MG tablet, TAKE 1 TABLET EVERY DAY, Disp: 90 tablet, Rfl: 3    ALLERGIES:  No Known Allergies    PE:  General/Constitutional: Patient is seated comfortably in NAD, well-groomed.   Head/Eyes: Head atraumatic, EOMI   ENT: Hearing grossly normal   Respiratory: Breathing comfortably on RA, normal respiratory effort, no audible wheeze   Skin: No appreciable rashes or skin breakdown   Psychiatric: Appropriate affect, A&Ox3 , answers questions appropriately, short and long term memory grossly normal, normal insight   Lumbar exam:    Musculoskeletal:    Inspection: Normal lumbar coronal and sagittal  contours.    Stations:  Independent transfers from sitting to standing.    Gait:  Within normal limits.  Heel-toe and tandem walking intact.    Palpation: No tenderness over bilateral lumbar paravertebral musculature.    Range of motion: Lumbar spine range of motion is full with pain on extension and rotation    Special tests:  Negative sacroiliac joint tests (distraction, compression, thigh thrust, Faber's, Patrick's, Production Manager, Gaenslen's)    Neurological:    Motor: 5/5 throughout right and left lower extremities.  Normal tone without atrophy or abnormal movements.      Sensory: Grossly intact to light touch and pinprick throughout the right and left lower extremities.    DTRs:  2+ and symmetric at right and left patellar and Achilles.    Special tests: Negative right and left straight leg raise.  Right and left plantar responses are flexor nature.  No clonus about the ankles.       1. Chronic  right-sided low back pain with right-sided sciatica  Assessment & Plan:   Gaylin is a very pleasant 72 year old female with chronic low back pain with radiation to the right lower extremity in a right L5 pattern.  Unfortunately, her pain has worsened over the past two years.  She has been participating in physical therapy for the past four weeks, and has not seen any improvement of her pain.     No new imaging was available to review today.  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     On exam, her pain appears to be an L5 radiculopathy on the right.  No weakness or any neurological red flag signs or symptoms are seen.  Because she has failed a course of conservative treatment, I have recommended a lumbar MRI to evaluate for progressive stenosis.  She previously has done well with epidural steroid injections in the past about three years ago, and she may be a candidate for this again.  Orders:  -     MRI LUMBAR SPINE WO CONTRAST; Future

## 2022-10-31 NOTE — Assessment & Plan Note (Signed)
 Shelly Perkins is a very pleasant 72 year old female with chronic low back pain with radiation to the right lower extremity in a right L5 pattern.  Unfortunately, her pain has worsened over the past two years.  She has been participating in physical therapy for the past four weeks, and has not seen any improvement of her pain.     No new imaging was available to review today.  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     On exam, her pain appears to be an L5 radiculopathy on the right.  No weakness or any neurological red flag signs or symptoms are seen.  Because she has failed a course of conservative treatment, I have recommended a lumbar MRI to evaluate for progressive stenosis.  She previously has done well with epidural steroid injections in the past about three years ago, and she may be a candidate for this again.

## 2022-11-04 ENCOUNTER — Inpatient Hospital Stay: Admit: 2022-11-04 | Payer: MEDICARE | Primary: Family Medicine

## 2022-11-04 NOTE — Progress Notes (Signed)
 Provider Number [phone]  Physical Therapy Treatment Note  Patient: Shelly Perkins (72 y.o. female)    DOB:  Apr 22, 1950  Treatment Date: 11/04/2022  Plan of Care/Certification Expiration Date: 12/03/22     Referring Provider: Siulinski, Alisa A, APRN - NP  Sec Prov:   Start of Care Date: 10/08/2022   Insurance: Payor: HU[employer]NA MEDICARE / Plan: [employer] GOLD PLUS HMO / Product Type: *No Product type* /   Secondary Insurance (if applicable):  Visits from Start of Care:  5     Appointment CX/NS History:  No data recorded Progress Note Due:  Progress Note Due Date: 11/05/22        Medical Diagnosis: Lumbago with sciatica, right side [M54.41]  Treatment Diagnosis:   Treatment Diagnosis: Right mid to low back pain that travels down the right LE    Problem Onset date:   Onset Date:  (4-5 years)          Subjective:     Any medical or medication changes? No         HEP Compliance: Yes      Pain Level:  7      Patient Comments:   I'm not sure how much, I'll be able to do- states her L foot got shut in by her car door the wind slammed the door into her foot; back pain is about the same- feels traction is helpful, but wasn't sure about it today because of the foot pain  Condition improvement: stable     Objective:    Restrictions/Precautions: Restrictions/Precautions: Other (comment) (A-fib, reported history of osteoporosis)        Observations/Activities, tests and measures:              Treatment:      Therapeutic Exercise:     Verbal Review of HEP    Modalities:  Prone  Interferential current electrical stimulation therapy in order to reduce pain, and/or muscle spasm, using 2 channels. Parameters:  location: B Low Back  @ output intensity 12 for duration 13 minutes with 2 minutes for preparation/education.      *In conjunction w/ MHP    Patient/ Caregiver Education and Instruction:   self care and exercises, IFC e-stim and rationale; should continue with therapy, she only has one more scheduled and wanted to wait until she  got her MRI before scheduling more appointments; pt education on POC regardless of MRI findings    Home Exercise Program: Verbal discussion/Education    Assessment:    Post-Treatment Pain Level:   6  Patient's Activity Tolerance:     fair  tolerated the activities well with no complications. Liked the e-stim; will need to review and progress HEP    Overall progress is considered to be: Slow   Patient will continue to benefit from skilled PT services.     Conditions requiring continued skilled PT services: Body Structures, Functions, Activity Limitations Requiring Skilled Therapeutic Intervention: Decreased functional mobility ; Decreased ADL status; Decreased ROM; Decreased body mechanics; Decreased strength; Decreased posture; Increased pain      Factors which may impact rehabilitation potential include:       Plan:   Continue Therapy current plan of care, Update Exercises and Activities as tolerated  neuromuscular re-education/strengthening and range of motion: active/assisted/passive and pain mgt    *Hold after next visit, pt wants to wait for MRI and proceed as advisied    Therapist Electronic Signature:    BECKEY JINNY DALES, PTA, 11/04/2022 Start Time: 8596  End Time: 1430   Timed Minutes:  12  Untimed Minutes: 15  Total Treatment Time: 27   Time Breakdown:  Modalities: IFC/MHP=15  MT:     TE:     TA: 12   GT:    NMRE

## 2022-11-07 ENCOUNTER — Inpatient Hospital Stay: Admit: 2022-11-07 | Payer: MEDICARE | Primary: Family Medicine

## 2022-11-07 NOTE — Progress Notes (Signed)
 Provider Number [phone]  Physical Therapy Discharge Summary  Patient: Shelly Perkins (72 y.o. female)    DOB:  1950-07-15  Today's Date: 02/13/2023  Medicare Cert period if applicable:  Plan of Care/Certification Expiration Date: 12/03/22     Referring Provider: Shona Neil LABOR, APRN - NP  Sec Prov:   Start of Care Date: 10/08/2022  End of Care Date:  9/13/024    Insurance: Payor: HUMANA MEDICARE / Plan: HU[employer]NA GOLD PLUS HMO / Product Type: *No Product type* /   Social Research Officer, Government (if applicable):  Visits from Start of Care:  6     Appointment CX/NS History:  No data recorded Length of Care: 4 wks   Medical Diagnosis: Lumbago with sciatica, right side [M54.41]  Treatment Diagnosis:   Treatment Diagnosis: Right mid to low back pain that travels down the right LE    Problem Onset date:   Onset Date:  (4-5 years)          Subjective and Objective:     No subjective/objective data collected at time of discharge as patient is being discharged without a formal visit.     ASSESSMENT/PLAN   Patient last seen in clinic on 11/07/2022 and has not returned for follow up treatment. Will formally discharge this episode of care as of today's date.    Patient's Activity Tolerance:      fair             Goals update: not met     Discharge due to:  [X]  Other: Pt. On hold for MRI and follow up with physiatry, has not followed up since..   D/C    Therapist Signature:  DONNICE JINNY GULL, PT   Date: 02/13/2023  Time: 11:26 AM

## 2022-11-07 NOTE — Progress Notes (Signed)
 Provider Number [id number]  Physical Therapy Progress note and Treatment note  Patient: Shelly Perkins (72 y.o. female)    DOB:  [birth year]  Treatment Date: 11/07/2022  Medicare Cert period if applicable:   Plan of Care/Certification Expiration Date: 12/03/22     Referring Provider: Shona Neil LABOR, APRN - NP  Sec Prov:   Start of Care Date: 10/08/2022   Insurance: Payor: HUMANA MEDICARE / Plan: HU[employer]NA GOLD PLUS HMO / Product Type: *No Product type* /   Secondary Insurance (if applicable):  Visits from Start of Care:  6     Appointment CX/NS History:  No data recorded Progress Note Due:  Progress Note Due Date: 11/05/22        Medical Diagnosis: Lumbago with sciatica, right side [M54.41]  Treatment Diagnosis:   Treatment Diagnosis: Right mid to low back pain that travels down the right LE    Problem Onset date:   Onset Date:  (4-5 years)       Subjective:     Any medical or medication changes? No          HEP Compliance: Yes        Pain Level:  7/10,        Patient Comments:   E stim felt really good on the low back, seems to be the thing that has helped the most so far. Pt notes not seeing a lot of progress.  Condition improvement: not changed    Objective:    Restrictions/Precautions: Restrictions/Precautions: Other (comment) (A-fib, reported history of osteoporosis)        Observations/Activities, tests and measures:        Spinal ROM:            (REF.)   % of normal or degrees.   Comments          Forward Flexion  (90) To floor            Lumbar Extension  (35)         (Right) Lateral Flexion   (30) To knee       (Left) Lateral Flexion   (30)  to knee       (Right) Rotation   (30) 75%        (Left) Rotation   (30) 90%   painful in right mid/low back with with testing          Straight Leg Raise  (80)              Hip Flexion  (120)              Hip Extension  (30)            Left AROM  Right AROM       Hip internal rotation: 40/45  Hip external rotation:30/45  30/45  30/45       Left Strength  Right Strength        Hip flexion: -4/5    -4/5          Outcome Measure(s) Completed:   Modified Oswestry Disability Index (Mod ODI)  Pain Intensity: The pain is bad, but I can manage without having to take pain medication.  Personal Care: I can take care of myself normally without causing increased pain.  Lifting: Pain prevents me from lifting heavy weights off the floor, but I can manage if the weights are conveniently positioned (e.g., on a table).  Walking: Pain prevents me from walking more than 1 mile. (1 mile  Sitting: Pain prevents me from sitting for more than 1 hour.  Standing: Pain prevents me from standing for more than 1 hour.  Sleeping: Even when I take medication, I sleep less than 6 hours.  Social Life: My social life is normal and does not increase my pain.  Traveling: I can travel anywhere, but it increases my pain.  Employment/Homemaking: My normal homemaking/job activities increase my pain, but I can still perform all that is required of me.  Modified Oswestry Disability Total Scores: 12  Number of Sections Completed: 10  Oswestry Disability Scores %: 24 % Disability     Current Total Disability Percentage: 24 % Disability % (Date: 11/07/2022)    Interpretation of Score: Higher score indicates symptoms are impacting life and patient is more disabled from back pain. Minimum Detectable Change (90% confidence): 10% points (Change of less than this amount may be attributed to error in the measurement.)    Treatment:        Therapeutic Activities:      Data collection     Modalities:  Prone  Interferential current electrical stimulation therapy in order to reduce pain, and/or muscle spasm, using 2 channels. Parameters:  location: B Low Back  @ output intensity 12 for duration 18 minutes with 2 minutes for preparation/education.        Patient/ Caregiver Education and Instruction:   exercises       Assessment:    Post Treatment Pain level:   5/10  Patient's Activity Tolerance:     fair    Overall goal progress is  considered to be: Slow   Patient will   continue to benefit from skilled PT services.   Conditions requiring continued skilled PT services: Body Structures, Functions, Activity Limitations Requiring Skilled Therapeutic Intervention: Decreased functional mobility ; Decreased ADL status; Decreased ROM; Decreased body mechanics; Decreased strength; Decreased posture; Increased pain    Overall Goal Update:  not met           Short Term Goals:  by 4 weeks  Elisse will report right knee pain as 4/10 at most to improve walking tolerance Not MET  Bunny will report low back pain as 5/10 at most to improve quality of life Not MET  Long Term Goals:  by 8 weeks  Kharisma will report right knee pain as 2/10 at most with periods where she has no pain, improving walking tolerance Not MET  Sheilia will have hip mobility WNL to decrease strain to low back helping to improve pain Not MET  Virlee will report right low back pain as 3/10 at most with periods where she is pain free Not MET  Tonye will score 15% or better on her ModODI to show functional improvement Not MET  Rashea will be able to walk as far as needed/desired for physical and mental health benefits as well as improve access to community areas Not MET        Patient's rehabilitation potential/prognosis: is considered to be: Therapy Prognosis: Good    Factors which may impact rehabilitation potential include: Barriers impacting rehab : Age; Chronicity of problem; Medical co-morbidities         Treatment Plan   Plan Frequency: 2 (30)  Plan weeks: 8  Current Treatment Recommendations: Strengthening; ROM; Therapeutic activities; Modalities; Home exercise program; Manual; Pain management; Neuromuscular re-education     Specific Instructions for Next Treatment: Core strengthening, hip mobility, glue strengthening, traction      Pt requests being put on hold until  MRI and physiatry follow up.     Therapist Electronic Signature:    Joeann Steppe, PTA, 11/07/2022 Start Time:   2:00 PM EDT      End Time: 2:32 PM                                             Timed Minutes:  10  Untimed Minutes: 20  Total Treatment Time: 32   Time Breakdown:  Modalities: 20  TA: 10

## 2022-11-19 ENCOUNTER — Inpatient Hospital Stay: Admit: 2022-11-19 | Payer: MEDICARE | Primary: Family Medicine

## 2022-11-19 ENCOUNTER — Encounter

## 2022-11-19 DIAGNOSIS — M5441 Lumbago with sciatica, right side: Secondary | ICD-10-CM

## 2022-11-20 ENCOUNTER — Inpatient Hospital Stay: Admit: 2022-11-20 | Payer: MEDICARE | Attending: Family Medicine | Primary: Family Medicine

## 2022-11-20 ENCOUNTER — Inpatient Hospital Stay: Admit: 2022-11-20 | Payer: MEDICARE | Primary: Family Medicine

## 2022-11-20 DIAGNOSIS — R918 Other nonspecific abnormal finding of lung field: Secondary | ICD-10-CM

## 2022-11-20 DIAGNOSIS — D649 Anemia, unspecified: Secondary | ICD-10-CM

## 2022-11-20 LAB — CBC WITH AUTO DIFFERENTIAL
Basophils %: 1 % (ref 0–2)
Basophils Absolute: 0.1 10*3/uL (ref 0.0–0.1)
Eosinophils %: 3 % (ref 0–5)
Eosinophils Absolute: 0.2 10*3/uL (ref 0.0–0.4)
Hematocrit: 32 % — ABNORMAL LOW (ref 37.0–47.0)
Hemoglobin: 10.4 g/dL — ABNORMAL LOW (ref 12.0–16.0)
Immature Granulocytes %: 1 % — ABNORMAL HIGH (ref 0.0–0.6)
Immature Granulocytes Absolute: 0 10*3/uL (ref 0.00–0.04)
Lymphocytes Absolute: 1.9 10*3/uL (ref 1.2–3.7)
Lymphocytes: 22 % (ref 14–46)
MCH: 30.1 pg (ref 27.0–31.0)
MCHC: 32.5 g/dL — ABNORMAL LOW (ref 33.0–37.0)
MCV: 92.5 FL (ref 80.0–94.0)
MPV: 10 FL (ref 7.4–10.4)
Monocytes %: 9 % (ref 5–12)
Monocytes Absolute: 0.8 10*3/uL (ref 0.2–1.0)
Neutrophils Absolute: 5.4 10*3/uL (ref 1.6–6.1)
Nucleated RBCs: 0 /100{WBCs}
Platelets: 354 10*3/uL (ref 130–400)
RBC: 3.46 M/uL — ABNORMAL LOW (ref 4.20–5.40)
RDW: 14.2 % (ref 11.5–14.5)
Seg Neutrophils: 64 % (ref 47–80)
WBC: 8.3 10*3/uL (ref 4.5–10.9)
nRBC: 0 10*3/uL

## 2022-11-20 LAB — FERRITIN: Ferritin: 12.8 ng/mL (ref 8–252)

## 2022-11-20 LAB — FOLATE: Folate: 15.1 ng/mL (ref 3.1–17.5)

## 2022-11-20 LAB — TIBC: TIBC: 445 ug/dL (ref 250–450)

## 2022-11-20 LAB — VITAMIN B12: Vitamin B-12: 335 pg/mL (ref 193–986)

## 2022-12-05 ENCOUNTER — Ambulatory Visit: Admit: 2022-12-05 | Discharge: 2022-12-05 | Payer: MEDICARE | Primary: Family Medicine

## 2022-12-05 DIAGNOSIS — M5417 Radiculopathy, lumbosacral region: Secondary | ICD-10-CM

## 2022-12-05 NOTE — Patient Instructions (Signed)
You will be scheduled for : epidural steroid injection    Please read the following instructions to help you prepare for your upcoming procedure:    Procedures are done in the Outpatient Department at [facility], located at [address] in [place].  Once in the building, you will go to the Outpatient department on the second floor.    To expedite the registration process and allow [place] to maintain social distancing, the registrant may contact you the day prior to your injection to update your insurance information, obtain consent for treatment, and collect a co-pay if applicable. Please arrive at your scheduled time to allow for registration and initial assessment.  If you arrive later than your scheduled time, your procedure may have to be rescheduled.    You will receive a call from our office to schedule this injection.  Please expect to wait at least 14 business days for this phone call.  You will be given your arrival time either in the office or during that call.  PLEASE DISREGARD ANY OTHER NOTIFICATION OF A TIME OTHER THAN THE ARRIVAL TIME GIVEN TO YOU BY OUR OFFICE.    If you are on antibiotics, antivirals, or antifungals by mouth for an infection, we will not be able to do the procedure until you have completed the full course.  In this case, please call us to reschedule the procedure.    If this is a test procedure (medial branch blocks or joint nerve blocks), do not take any "as needed" pain medications prior to the procedure.  You will need to have at least moderate (5-6/10) pain in order for the test procedure to be accurate.    If you are diabetic, your sugar will be checked before the procedure.  If it is over 300, the procedure will need to be rescheduled.    If you are on aspirin or a blood thinner, it may need to be stopped before your procedure.  Our clinical team will tell you how far in advance to stop your blood thinner, if needed.    If you are on Coumadin/Warfarin and it needs to be  stopped for the procedure, you will be arriving in the outpatient department 1 hour before your procedure for an INR check.    YOU ARE REQUIRED TO HAVE A DRIVER FOR THIS PROCEDURE.    Day of Procedure:   1. Please bring a current medication list with you. Please bring minimal belongings and limit jewelry.  2. A driver will be required if you need any medication for the procedure to help you relax.   3. You will be given a follow up appointment after procedure.     Please call the Physiatry office with any questions. [phone]

## 2022-12-05 NOTE — Progress Notes (Signed)
[facility] Follow Up Note    Date of Service: 12/09/22  Patient Name: Shelly Perkins  Patient DOB: Sep 25, 1950  PCP: Cornelius Moras, MD    No chief complaint on file.       HPI:  Shelly Perkins is a 72 y.o. year old female with a PMH significant for tobacco abuse disorder, anemia, atrial fibrillation anticoagulated on Eliquis  who presents today for follow up regarding low back and right leg pain.      Last seen in this office on 10/31/22, at that time a lumbar MRI was recommended.  She returns to review this today.  She denies any changes to her symptoms.  She recently completed a course of physical therapy with marginal improvement of her pain.  She continues to have low back pain with right lower extremity radicular pain.  She also continues to have stomach/flank pain onto the right.    The patient denies new numbness/tingling and weakness. The patient denies bladder/bowel dysfunction, urgency, and incontinence. The patient denies unexplained weight loss, fevers/chills, and night sweats. The patient denies new balance problems.    ROS:  Please see HPI.    IMAGING:  EXAM: MRI LUMBAR SPINE WO CONTRAST     INDICATION: 72 year old female with chronic right-sided low back pain with right-sided sciatica; Chronic right-sided low back pain with right-sided sciatica; Pt states ongoing LBP with right leg radiculopathy.      COMPARISON: None.     TECHNIQUE: Noncontrast sagittal T1, T2 and STIR sequences, as well as coronal T2 sequence. Axial T1 and T2 weighted sequences through the lumbar spine were obtained.      FINDINGS: Overall image quality is degraded by technical artifact and some degree of patient motion artifact.     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 Based on parasagittal imaging, there is no malalignment. Degeneration of all lumbar disks with variable loss disc space height. The conus occupies a normal location and is  normal in appearance. There is no intraspinal mass. Prominent degenerative endplate changes are present at L2-3 without edema. There are subtle edematous endplate changes L3-4, L4-5 and L5-S1 along with L1-2.     At the T11-12 and T12-L1 levels, there is bilateral posterior facet hypertrophy present, minimal diffuse disc bulge. There is no canal stenosis at either level. Minimal foraminal compromise bilaterally.     At L1-2, there is mild diffuse disc bulge with osteophyte associated with mild deformity of the ventral thecal sac. Mild bilateral posterior hypertrophy ligamentous thickening. No canal stenosis. There is mild foraminal encroachment left greater than right this level.     At L2-3, diffuse disc bulge with osteophyte formation is present, subtly eccentric left, associated with deformity of the ventral thecal sac. There is mild bilateral posterior hypertrophy left greater than right with mild ligamentous thickening. No canal stenosis, however, there is moderately severe foraminal encroachment on the left at this level with milder foraminal encroachment on the right.     At L3-4, diffuse disc bulge with osteophyte formation is present associated with mild deformity of the ventral thecal sac. Mild posterior facet hypertrophy. There is no canal stenosis. Moderate foraminal encroachment on the left with milder foraminal encroachment on the right.     At L4-5, there is diffuse disc bulge with osteophyte associated with mild deformity of the ventral thecal sac. There is mild to moderate posterior hypertrophy with ligamentous thickening. Mild central canal narrowing at this level without  frank stenosis. There is moderately severe foraminal encroachment on the proximal right neural foramen at this level with moderate foraminal encroachment on the left.     At L5-S1, there is diffuse disc bulge with osteophyte associated with mild deformity of the ventral thecal sac. Moderate posterior facet hypertrophy with  ligamentous thickening is present. There is no canal stenosis, however, degenerative factors combine to result in severe encroachment on the proximal right neural foramen at this level with moderately severe foraminal encroachment on the left.     IMPRESSION: Degenerative changes are present throughout the lumbar region, as detailed above. There is no canal stenosis at any level, however, there is marked multilevel foraminal encroachment present, please see details above.     PMH:  Past Medical History:   Diagnosis Date    Atrial fibrillation (HCC)     GERD (gastroesophageal reflux disease)     Hypertension      Past Surgical History:   Procedure Laterality Date    APPENDECTOMY      CESAREAN SECTION      NECK SURGERY      fusion       PFHx:  Family History   Problem Relation Age of Onset    Cancer Father        SOCIAL HISTORY:  Social History     Social History Narrative    Not on file       MEDICATIONS:    Current Outpatient Medications:     irbesartan (AVAPRO) 150 MG tablet, TAKE 1 TABLET EVERY NIGHT, Disp: 90 tablet, Rfl: 3    apixaban (ELIQUIS) 5 MG TABS tablet, TAKE 1 TABLET TWICE DAILY, Disp: 180 tablet, Rfl: 1    Ferrous Sulfate (IRON) 325 (65 Fe) MG TABS, Take 1 each by mouth daily, Disp: 90 tablet, Rfl: 0    pantoprazole (PROTONIX) 40 MG tablet, Take 1 tablet by mouth in the morning and at bedtime, Disp: , Rfl:     metoprolol tartrate (LOPRESSOR) 25 MG tablet, TAKE 1 TABLET TWICE DAILY, Disp: 180 tablet, Rfl: 1    rosuvastatin (CRESTOR) 10 MG tablet, TAKE 1 TABLET EVERY DAY, Disp: 90 tablet, Rfl: 3    LORazepam (ATIVAN) 0.5 MG tablet, Take 1 tablet by mouth nightly as needed. (Patient not taking: Reported on 12/05/2022), Disp: , Rfl:     ALLERGIES:  No Known Allergies    PE:  General/Constitutional: Patient is seated comfortably in NAD, well-groomed.   Head/Eyes: Head atraumatic, EOMI   ENT: Hearing grossly normal   Respiratory: Breathing comfortably on RA, normal respiratory effort, no audible wheeze    Skin: No appreciable rashes or skin breakdown   Psychiatric: Appropriate affect, A&Ox3 , answers questions appropriately, short and long term memory grossly normal, normal insight   Musculoskeletal:     Inspection: Normal lumbar coronal and sagittal contours.     Stations:  Independent transfers from sitting to standing.     Gait:  Within normal limits.  Heel-toe and tandem walking intact.     Palpation: No tenderness over bilateral lumbar paravertebral musculature.     Range of motion: Lumbar spine range of motion is full with pain on extension and rotation     Special tests:  Negative sacroiliac joint tests (distraction, compression, thigh thrust, Faber's, Patrick's, Production manager, Gaenslen's)     Neurological:     Motor: 5/5 throughout right and left lower extremities.  Normal tone without atrophy or abnormal movements.       Sensory: Grossly  intact to light touch and pinprick throughout the right and left lower extremities.     DTRs:  2+ and symmetric at right and left patellar and Achilles.    1. Radiculopathy, lumbosacral region  Assessment & Plan:   Shelly Perkins is a very pleasant 72 year old female with chronic low back pain with radiation to the right lower extremity in a right L5 pattern.  She has been participating in physical therapy for the past four weeks, and has not seen any improvement of her pain.     She recently underwent an updated lumbar MRI, and this was independently interpreted and reviewed with the patient in the office today.  MRI demonstrates significant degenerative changes throughout the lumbar spine.  There is severe multilevel foraminal narrowing at each level.  This appears to be worse at L4-5 and L5-S1.    Her symptoms are consistent with a right L5/S1 radiculopathy, as well as a possible right T12 radicular pain.  Patient is currently anticoagulated on Eliquis, and we would prefer not to stop this for a procedure.  Because of this, I have recommended a right L4-5, L5-S1 TESI.   She has had injections in the past at a different facility and is familiar with these.  She is not a diabetic.  The potential risks and benefits were reviewed, which she understands and would like to proceed.    I will see her back two weeks after the procedure to monitor for relief> I encouraged her to continue her home exercise and core strengthening program.   Orders:  -     Amb Referral to Outpatient Department

## 2022-12-09 DIAGNOSIS — M5417 Radiculopathy, lumbosacral region: Secondary | ICD-10-CM

## 2022-12-09 NOTE — Assessment & Plan Note (Addendum)
Shelly Perkins is a very pleasant 72 year old female with chronic low back pain with radiation to the right lower extremity in a right L5 pattern.  She has been participating in physical therapy for the past four weeks, and has not seen any improvement of her pain.     She recently underwent an updated lumbar MRI, and this was independently interpreted and reviewed with the patient in the office today.  MRI demonstrates significant degenerative changes throughout the lumbar spine.  There is severe multilevel foraminal narrowing at each level.  This appears to be worse at L4-5 and L5-S1.    Her symptoms are consistent with a right L5/S1 radiculopathy, as well as a possible right T12 radicular pain.  Patient is currently anticoagulated on Eliquis, and we would prefer not to stop this for a procedure.  Because of this, I have recommended a right L4-5, L5-S1 TESI.  She has had injections in the past at a different facility and is familiar with these.  She is not a diabetic.  The potential risks and benefits were reviewed, which she understands and would like to proceed.    I will see her back two weeks after the procedure to monitor for relief> I encouraged her to continue her home exercise and core strengthening program.

## 2022-12-16 ENCOUNTER — Encounter

## 2022-12-16 NOTE — Telephone Encounter (Signed)
Patient's name and date of birth verified at start of call.   Incoming refill request.  Caller has been notified that we require a minimum of 2 business days for refill processing. (This does not include weekends, holidays, etc.)      Shelly Perkins  February 01, 1951      Confirmed best contact number:   Home Phone [phone] (home)    Medications Requested:  Requested Prescriptions     Pending Prescriptions Disp Refills    LORazepam (ATIVAN) 0.5 MG tablet       Sig: Take 1 tablet by mouth nightly as needed. Max Daily Amount: 0.5 mg       Preferred Pharmacy: [phone] - [place], [place] - [address] [place] [phone] - F [phone]   Has patient already contacted pharmacy to confirm no refills were on file: Yes    Notes for office regarding medication request: Patient is out of medication and stated she started having anxiety again. Having trouble sleeping and not able to eat. Please complete ASAP.           Other instructions and notes:  Last visit with provider: 07/25/2022  Next scheduled visit with provider: 07/28/2023  *If next visit is not scheduled, book next needed visit or document if recall was added to list prior to sending for processing*  Inform patient that this needs to be on file before sending request as we do require for them to remain up-to-date with their recommended healthcare in order to avoid any interruptions in our ability to provide ongoing care such as refills.     Patient MyChart Status:  For Active Patients - Patient has been notified that they will receive an automated notification via MyChart once their script has been processed.   For Inactive Patients - Patient is aware that we have a patient portal, MyChart, which offers many benefits such as being able to request their refills electronically and receive automated notifications when scripts are processed.  In addition, they can schedule and manage appointments, view their testing results and visit notes, and stay connected  with their care team.  Patient offered MyChart today.  Patient accepted.  (Send link for set up if accepted)

## 2022-12-17 ENCOUNTER — Encounter

## 2022-12-17 MED ORDER — METOPROLOL TARTRATE 25 MG PO TABS
25 | ORAL_TABLET | Freq: Two times a day (BID) | ORAL | 3 refills | 90.00000 days | Status: DC
Start: 2022-12-17 — End: 2024-02-22

## 2022-12-17 MED ORDER — LORAZEPAM 0.5 MG PO TABS
0.5 MG | ORAL_TABLET | Freq: Every evening | ORAL | 0 refills | Status: DC | PRN
Start: 2022-12-17 — End: 2023-02-09

## 2022-12-17 NOTE — Telephone Encounter (Signed)
Medications Requested:  Requested Prescriptions     Pending Prescriptions Disp Refills    metoprolol tartrate (LOPRESSOR) 25 MG tablet [Pharmacy Med Name: Metoprolol Tartrate Oral Tablet 25 MG] 180 tablet 3     Sig: TAKE 1 TABLET TWICE DAILY       Preferred Pharmacy:   [facility] - [place], [place] - [address] - P [phone] - F [phone]  [address]  [place] [address]  Phone: [phone] Fax: [phone]      Date of Last Refill: 07/23/22  Beta-Blockers Protocol Passed10/23/2024 02:40 AM   Protocol Details Last Pulse reading greater than 50 recorded within past year    Visit with authorizing provider in past 9 months or upcoming 90 days       Last appt @ PCP Office: 07/25/2022     Future Appointments   Date Time Provider Department Center   07/28/2023  3:00 PM Cornelius Moras, MD [facility]       MOST RECENT BLOOD PRESSURES  BP Readings from Last 3 Encounters:   12/05/22 130/76   10/31/22 120/60   09/19/22 120/60         MOST RECENT LAB DATA  Lab Results   Component Value Date/Time    K 4.4 07/28/2022 08:33 AM    ALT 21 07/28/2022 08:33 AM    CHOL 133 07/28/2022 08:33 AM    HGB 10.4 11/20/2022 01:33 PM    HCT 32.0 11/20/2022 01:33 PM    INR 1.0 06/30/2021 12:58 PM

## 2022-12-17 NOTE — Telephone Encounter (Signed)
09/16/21 New PT appt : History of anxiety and the was [place] the [place]azepam [place] .  She takes at nighttime which helps her with sleep as well.  Denies any side effects of the med. No HI/SI     Pt has gone without lorazepam for over a year but is requesting to go back on it for anxiety and sleep.  PMP is good.     CONTROLLED SUBSTANCE REFILL REQUEST    CLINICAL STAFF ACTION: PMP/PDMP was reviewed and appropriate, CSA and UDS are NOT up to date. These will be requested at upcoming appointment.  Rx was pended for provider to sign.    Requested Prescriptions     Pending Prescriptions Disp Refills    LORazepam (ATIVAN) 0.5 MG tablet       Sig: Take 1 tablet by mouth nightly as needed. Max Daily Amount: 0.5 mg       MEDICATION #1  Next due date:  NOW  Last prescription refill date: 09/16/21  Number of pills given with last prescription: 30  Maximum pills per day: 1    Last office visit: 07/25/2022     Future Appointments   Date Time Provider Department Center   07/28/2023  3:00 PM Cornelius Moras, MD [facility]        Controlled Substance Flowsheet (last value only)      12/17/2022     9:44 AM   [place] Controlled Substance Flowsheet   Medication contract reviewed and signed 09/16/2021   PMP Reviewed 12/17/2022         12/17/2022     9:44 AM   [place] Controlled Substances   Medication contract reviewed and signed 09/16/2021   PMP Reviewed 12/17/2022            Documented by: Tenny Craw, CMA

## 2022-12-23 NOTE — Telephone Encounter (Signed)
Referral for Right L4-5, L5-S1 TESI.  LMOM for call back to sched.  Advised I am available Monday-Thursday from 8:00 - 4:30.

## 2022-12-24 NOTE — Telephone Encounter (Signed)
Pt name and DOB verified.    Pt calling in returning Shelly Perkins call.     CB [phone] (home)

## 2022-12-24 NOTE — Telephone Encounter (Addendum)
Spoke with patient, sched for 01/08/23 @8 :00.  Confirmed procedure, [facility] location and arrival time @7 :30.    Advised if for any reason patient is placed on abx or becomes ill, to call the office to reschedule.  Reminded she will need a driver.

## 2023-01-03 MED ORDER — ROSUVASTATIN CALCIUM 10 MG PO TABS
10 MG | ORAL_TABLET | Freq: Every day | ORAL | 3 refills | Status: DC
Start: 2023-01-03 — End: 2023-06-26

## 2023-01-03 NOTE — Telephone Encounter (Signed)
Name from pharmacy: Rosuvastatin Calcium Oral Tablet 10 MG         Will file in chart as: rosuvastatin (CRESTOR) 10 MG tablet    Sig: TAKE 1 TABLET EVERY DAY    Disp: 90 tablet    Refills: 3    Start: 01/03/2023    Class: Normal    Non-formulary    Last ordered: 9 months ago (03/10/2022) by Cornelius Moras, MD    Last refill: 10/23/2022    Rx #: [id number]    Hmg CoA Reductase Inhibitors Protocol Passed11/10/2022 02:56 AM   Protocol Details Last Lipid panel resulted within the past 12 months    Visit with authorizing provider in past 9 months or upcoming 90 days    Last AST and ALT levels < or = to 3x normal within the past 3 years        Future Appointments   Date Time Provider Department Center   01/08/2023  8:00 AM [facility]   07/28/2023  3:00 PM Cornelius Moras, MD [facility]

## 2023-01-08 ENCOUNTER — Inpatient Hospital Stay
Admit: 2023-01-08 | Discharge: 2023-01-12 | Payer: MEDICARE | Attending: Physical Medicine & Rehabilitation | Primary: Family Medicine

## 2023-01-08 VITALS — BP 157/53 | HR 60 | Temp 98.40000°F | Resp 20 | Ht 62.0 in | Wt 95.0 lb

## 2023-01-08 DIAGNOSIS — M5416 Radiculopathy, lumbar region: Secondary | ICD-10-CM

## 2023-01-08 MED ORDER — LIDOCAINE HCL (PF) 1 % IJ SOLN
1 | Freq: Once | INTRAMUSCULAR | Status: AC
Start: 2023-01-08 — End: 2023-01-08
  Administered 2023-01-08: 13:00:00 5 mL via EPIDURAL

## 2023-01-08 MED ORDER — DEXAMETHASONE SOD PHOSPHATE PF 10 MG/ML IJ SOLN
10 | Freq: Once | INTRAMUSCULAR | Status: AC
Start: 2023-01-08 — End: 2023-01-08
  Administered 2023-01-08: 13:00:00 20 mg via EPIDURAL

## 2023-01-08 MED ORDER — IOHEXOL 240 MG/ML IJ SOLN
240 | Freq: Once | INTRAMUSCULAR | Status: AC
Start: 2023-01-08 — End: 2023-01-08
  Administered 2023-01-08: 13:00:00 5 mL

## 2023-01-08 MED FILL — OMNIPAQUE 240 MG/ML IJ SOLN: 240 MG/ML | INTRAMUSCULAR | Qty: 10

## 2023-01-08 MED FILL — XYLOCAINE-MPF 1 % IJ SOLN: 1 % | INTRAMUSCULAR | Qty: 5

## 2023-01-08 MED FILL — DEXAMETHASONE SOD PHOSPHATE PF 10 MG/ML IJ SOLN: 10 MG/ML | INTRAMUSCULAR | Qty: 2

## 2023-01-08 NOTE — Progress Notes (Signed)
 Physiatry Procedure   History and plan of care reviewed.     Procedure: right l4-5 and l5-s1 tesi     Physician performing procedure room:   Dr. Noreene Larsson   Staff 1 in room: a Kynnadi Dicenso rn    Staff 2 in room: lauren xrt       Time out completed: 0816   * Patient

## 2023-01-08 NOTE — Procedures (Signed)
 PROCEDURE NOTE  Date: 01/08/2023   Name: Shelly Perkins  Date of Birth: 1950-09-15    Procedures    Date of Service: 01/08/23    Procedure:  right L4-L5 and L5-S1  Transforaminal Epidural Steroid Injection    Pre and Post Operative Diagnosis:  Lumbar radi

## 2023-02-05 ENCOUNTER — Encounter

## 2023-02-09 MED ORDER — LORAZEPAM 0.5 MG PO TABS
0.5 MG | ORAL_TABLET | ORAL | 0 refills | Status: DC
Start: 2023-02-09 — End: 2023-03-27

## 2023-02-09 NOTE — Telephone Encounter (Signed)
 CONTROLLED SUBSTANCE REFILL REQUEST    CLINICAL STAFF ACTION: PMP/PDMP was reviewed and appropriate, CSA and UDS are NOT up to date. These will be requested at upcoming appointment.  Rx was pended for provider to sign.    Requested Prescriptions     P[place]in

## 2023-03-13 MED ORDER — ELIQUIS 5 MG PO TABS
5 | ORAL_TABLET | Freq: Two times a day (BID) | ORAL | 3 refills | Status: DC
Start: 2023-03-13 — End: 2024-02-22

## 2023-03-13 NOTE — Telephone Encounter (Signed)
A. Date of last refill: 09/09/22  B. Date of last office visit pertaining to the medication being requested: 07/25/22  C. Prescription Refill Protocol:   "Medication: Eliquis  Last Office Visit: 1 year  Lab Monitoring: CBC, CMP annually, Check chart for duration of therapy  Category: Oral Anticoagulant  Length of refill: 1 year  Brand Name: Yes  Comments:"  D. Allergy List reviewed and Verified: Kelcie Currie A Stephine Langbehn, CMA  E. Review of possible medication to medication interactions: Audria Takeshita A Belkis Norbeck, CMA  F. Review last office visit pertaining to the medication being requested for adherence to assessment and plan: Sharonda Llamas A Lyndy Russman, CMA  G. Review labs pertaining to the medication being requested. Determine if patient is due for an office visit or follow-up testing per protocol and document in patient's chart: Next visit: 07/28/2023

## 2023-03-26 ENCOUNTER — Encounter

## 2023-03-27 MED ORDER — LORAZEPAM 0.5 MG PO TABS
0.5 | ORAL_TABLET | ORAL | 0 refills | Status: AC
Start: 2023-03-27 — End: 2023-04-26

## 2023-03-27 NOTE — Telephone Encounter (Signed)
CONTROLLED SUBSTANCE REFILL REQUEST    CLINICAL STAFF ACTION: PMP/PDMP was reviewed and appropriate, CSA and UDS are NOT up to date. These will be requested at upcoming appointment.  Rx was pended for provider to sign.    Requested Prescriptions     Pending Prescriptions Disp Refills    LORazepam (ATIVAN) 0.5 MG tablet [Pharmacy Med Name: LORAZEPAM 0.5MG  TABLETS] 30 tablet      Sig: TAKE 1 TABLET BY MOUTH EVERY NIGHT AS NEEDED FOR ANXIETY. MAX DAILY AMOUNT: 0.5 MG       MEDICATION #1  Next due date:  03/11/23  Last prescription refill date: 02/09/23  Number of pills given with last prescription: 30  Maximum pills per day: 1    Last office visit: 07/25/2022     Future Appointments   Date Time Provider Department Center   07/28/2023  3:00 PM Cornelius Moras, MD [facility]        Controlled Substance Flowsheet (last value only)      03/27/2023     7:11 AM   [place] Controlled Substance Flowsheet   Medication contract reviewed and signed 09/16/2021   PMP Reviewed 03/27/2023         03/27/2023     7:11 AM   [place] Controlled [place]stances   Medication contract reviewed and signed 09/16/2021   PMP Reviewed 03/27/2023            Documented by: Nelva Bush, CMA

## 2023-05-08 ENCOUNTER — Encounter

## 2023-05-11 MED ORDER — LORAZEPAM 0.5 MG PO TABS
0.5 | ORAL_TABLET | ORAL | 0 refills | 14.50000 days | Status: DC
Start: 2023-05-11 — End: 2023-06-29

## 2023-05-11 NOTE — Telephone Encounter (Addendum)
 Due Now    CONTROLLED SUBSTANCE REFILL REQUEST    CLINICAL STAFF ACTION: PMP was not printed off as it is not due yet.  CSA is not up to date, the UDS is not up to date. Rx was pended for provider to sign.      Requested Prescriptions     Pending Prescriptions Disp Refills    LORazepam (ATIVAN) 0.5 MG tablet [Pharmacy Med Name: LORAZEPAM 0.5MG  TABLETS] 30 tablet      Sig: TAKE 1 TABLET BY MOUTH EVERY NIGHT AS NEEDED FOR ANXIETY. MAX DAILY AMOUNT: 0.5 MG       MEDICATION #1  Next due date:  04/26/23  Last prescription refill date: 03/27/23  Number of pills given with last prescription: 30  Maximum pills per day: 1    Last office visit: 07/25/2022     Future Appointments   Date Time Provider Department Center   07/28/2023  3:00 PM Cornelius Moras, MD [facility]        Controlled Substance Flowsheet (last value only)      03/27/2023     7:11 AM   [place] Controlled Substance Flowsheet   Medication contract reviewed and signed 09/16/2021   PMP Reviewed 03/27/2023         03/27/2023     7:11 AM   [place] Controlled [place]stances   Medication contract reviewed and signed 09/16/2021   PMP Reviewed 03/27/2023            Documented by: Tenny Craw, CMA

## 2023-06-25 ENCOUNTER — Encounter

## 2023-06-26 ENCOUNTER — Ambulatory Visit: Admit: 2023-06-26 | Discharge: 2023-06-26 | Payer: Medicare (Managed Care) | Attending: Internal Medicine

## 2023-06-26 VITALS — BP 144/60 | HR 74 | Resp 18 | Ht 62.0 in | Wt 93.4 lb

## 2023-06-26 DIAGNOSIS — E611 Iron deficiency: Secondary | ICD-10-CM

## 2023-06-26 MED ORDER — SLOW RELEASE IRON 160 (50 FE) MG PO TBCR
160 | ORAL_TABLET | Freq: Two times a day (BID) | ORAL | 5 refills | 30.00000 days | Status: AC
Start: 2023-06-26 — End: ?

## 2023-06-26 MED ORDER — SLOW RELEASE IRON 160 (50 FE) MG PO TBCR
160 | ORAL_TABLET | Freq: Two times a day (BID) | ORAL | 5 refills | 30.00000 days | Status: DC
Start: 2023-06-26 — End: 2023-06-26

## 2023-06-26 NOTE — Progress Notes (Signed)
 CHIEF COMPLAINT     Shelly Perkins is a 73 y.o. female who presents for an acute visit due to Other (Fatigue, chills, ?nerve pain, numbness in fingers, about a month)    HPI     Pt of Dr Gillermo Lack here for several issues. Fatigue chills for several months and  numbness in fingers for a month.   Her hand numbness has been off and on on both hand  in last 2 mo.   She isnt taking iron  as it gives her diarrhea. She has iron  def anemia.  She has had iron  def since she was young. She hasn't tried slo fe.   Her cardiologist told her to stop her crestor .       Wt Readings from Last 3 Encounters:   06/26/23 42.4 kg (93 lb 6.4 oz)   01/08/23 43.1 kg (95 lb)   12/05/22 43.1 kg (95 lb)         Lab Results   Component Value Date    WBC 8.3 11/20/2022    HGB 10.4 (L) 11/20/2022    HCT 32.0 (L) 11/20/2022    MCV 92.5 11/20/2022    PLT 354 11/20/2022     Lab Results   Component Value Date    VITAMINB12 335 11/20/2022     No results found for: "TSH", "TSHFT4", "TSHELE", "TSH3GEN", "TSHHS"    Chemistry        Component Value Date/Time    NA 133 (L) 07/28/2022 0833    K 4.4 07/28/2022 0833    CL 103 07/28/2022 0833    CO2 24 07/28/2022 0833    BUN 22 07/28/2022 0833    CREATININE 1.06 07/28/2022 0833        Component Value Date/Time    CALCIUM  9.4 07/28/2022 0833    ALKPHOS 82 07/28/2022 0833    AST 20 07/28/2022 0833    ALT 21 07/28/2022 0833    BILITOT 0.30 07/28/2022 0833        Lab Results   Component Value Date    CHOL 133 07/28/2022     Lab Results   Component Value Date    TRIG 113 07/28/2022     Lab Results   Component Value Date    HDL 47 (L) 07/28/2022     Lab Results   Component Value Date    LDL 63.4 07/28/2022     No results found for: "VLDL"  Lab Results   Component Value Date    CHOLHDLRATIO 2.8 07/28/2022     Lab Results   Component Value Date    IRON  13 (L) 07/29/2022    TIBC 445 11/20/2022    FERRITIN 12.8 11/20/2022     Hemoglobin A1C   Date Value Ref Range Status   07/28/2022 5.6 <5.7 % Final     Comment:      Reference Range  Diabetic >=6.5%  Prediabetes  5.7-6.4%  Normal       <5.7%  The American Diabetes Association recommends an A1C goal of <7% for most adults with diabetes.           PHYSICAL EXAM     Physical Exam  Constitutional:       Comments: Thin pale   HENT:      Right Ear: Tympanic membrane, ear canal and external ear normal.      Left Ear: Tympanic membrane, ear canal and external ear normal.      Mouth/Throat:      Mouth: Mucous membranes are  moist.   Eyes:      Extraocular Movements: Extraocular movements intact.      Conjunctiva/sclera: Conjunctivae normal.      Pupils: Pupils are equal, round, and reactive to light.      Comments: Under eyelids are pale not pink   Neck:      Vascular: No carotid bruit.   Cardiovascular:      Rate and Rhythm: Normal rate. Rhythm irregular.      Pulses: Normal pulses.   Pulmonary:      Effort: Pulmonary effort is normal.      Breath sounds: Normal breath sounds. No wheezing or rhonchi.   Abdominal:      General: Abdomen is flat. Bowel sounds are normal.      Palpations: Abdomen is soft.      Tenderness: There is no abdominal tenderness.      Hernia: No hernia is present.   Lymphadenopathy:      Cervical: No cervical adenopathy.   Neurological:      General: No focal deficit present.      Mental Status: She is alert and oriented to person, place, and time.      Cranial Nerves: No cranial nerve deficit.      Motor: No weakness.      Gait: Gait normal.   Psychiatric:         Mood and Affect: Mood normal.         Behavior: Behavior normal.         Thought Content: Thought content normal.         Judgment: Judgment normal.         VITALS     Vitals:    06/26/23 1520   BP: (!) 144/60   BP Site: Left Upper Arm   Patient Position: Sitting   BP Cuff Size: Small Adult   Pulse: 74   Resp: 18   Weight: 42.4 kg (93 lb 6.4 oz)   Height: 1.575 m (5\' 2" )     - Body mass index is 17.08 kg/m.    MEDICATIONS     Current Outpatient Medications   Medication Sig    ferrous sulfate dried (SLOW  RELEASE IRON ) 160 (50 Fe) MG TBCR extended release tablet Take 160 mg by mouth 2 times daily    LORazepam  (ATIVAN ) 0.5 MG tablet TAKE 1 TABLET BY MOUTH EVERY NIGHT AS NEEDED FOR ANXIETY. MAX DAILY AMOUNT: 0.5 MG    metoprolol  tartrate (LOPRESSOR ) 25 MG tablet TAKE 1 TABLET TWICE DAILY    irbesartan  (AVAPRO ) 150 MG tablet TAKE 1 TABLET EVERY NIGHT    pantoprazole  (PROTONIX ) 40 MG tablet Take 1 tablet by mouth in the morning and at bedtime    ELIQUIS  5 MG TABS tablet TAKE 1 TABLET TWICE DAILY     No current facility-administered medications for this visit.     Medications Discontinued During This Encounter   Medication Reason    rosuvastatin  (CRESTOR ) 10 MG tablet Therapy completed    Ferrous Sulfate (IRON ) 325 (65 Fe) MG TABS Side effects       ALLERGIES   No Known Allergies  ACTIVE MEDICAL PROBLEMS     Patient Active Problem List   Diagnosis    Normal routine history and physical examination    Tobacco abuse disorder    Anemia    Anxiety    Other insomnia    Pain of upper abdomen    Paroxysmal atrial fibrillation (HCC)    Cervicalgia  Chronic left hip pain    Chronic left shoulder pain    Chronic left-sided low back pain with left-sided sciatica    Essential hypertension    Foraminal stenosis of lumbar region    Hypercholesteremia    Symptomatic menopausal or female climacteric states    Chronic right-sided low back pain with right-sided sciatica    Gastroesophageal reflux disease without esophagitis    Myalgia    Radiculopathy, lumbosacral region    Lumbar radiculopathy    Iron  deficiency    Chills    Other fatigue     SOCIAL HISTORY     Social History     Social History Narrative    Not on file       LABS & IMAGING   No results found for this or any previous visit (from the past 24 hours).  ASSESSMENT AND PLAN     1. Iron  deficiency  Assessment & Plan:    She is tired has fatigue chills and no energy. She has hx of iron  def anemia and isnt on iron  now. Check anemia check iron  check thyroid and ua  etc to see if  this is the anemia.   Try slo - Fe.  She may need iron  infusions every few months.   Orders:  -     CBC with Auto Differential; Future  -     Vitamin B12; Future  -     Folate; Future  -     Vitamin D 25 Hydroxy; Future  -     Comprehensive Metabolic Panel; Future  -     Iron  Profile; Future  -     TSH; Future  -     Sedimentation Rate; Future  -     ferrous sulfate dried (SLOW RELEASE IRON ) 160 (50 Fe) MG TBCR extended release tablet; Take 160 mg by mouth 2 times daily, Disp-30 tablet, R-5Normal  2. Chills  Assessment & Plan:   She is tired has fatigue chills and no energy. She has hx of iron  def anemia and isnt on iron  now. Check anemia check iron  check thyroid and ua  etc to see if this is the anemia.   Orders:  -     CBC with Auto Differential; Future  -     Vitamin B12; Future  -     Folate; Future  -     Vitamin D 25 Hydroxy; Future  -     Comprehensive Metabolic Panel; Future  -     Iron  Profile; Future  -     TSH; Future  -     Sedimentation Rate; Future  -     Urinalysis with Reflex Micro to Culture; Future  3. Other fatigue  Assessment & Plan:    She is tired has fatigue chills and no energy. She has hx of iron  def anemia and isnt on iron  now. Check anemia check iron  check thyroid and ua  etc to see if this is the anemia.   Orders:  -     CBC with Auto Differential; Future  -     Vitamin B12; Future  -     Folate; Future  -     Vitamin D 25 Hydroxy; Future  -     Comprehensive Metabolic Panel; Future  -     Iron  Profile; Future  -     TSH; Future  -     Sedimentation Rate; Future  -     Urinalysis with  Reflex Micro to Culture; Future  4. Long term (current) use of aspirin  -     Vitamin D 25 Hydroxy; Future       Follow up:  No follow-ups on file.

## 2023-06-26 NOTE — Assessment & Plan Note (Signed)
 She is tired has fatigue chills and no energy. She has hx of iron  def anemia and isnt on iron  now. Check anemia check iron  check thyroid and ua  etc to see if this is the anemia.

## 2023-06-26 NOTE — Assessment & Plan Note (Signed)
 She is tired has fatigue chills and no energy. She has hx of iron  def anemia and isnt on iron  now. Check anemia check iron  check thyroid and ua  etc to see if this is the anemia.   Try slo - Fe.  She may need iron  infusions every few months.

## 2023-06-26 NOTE — Addendum Note (Signed)
 Addended by: Donis Furnish on: 06/26/2023 03:47 PM     Modules accepted: Orders

## 2023-06-29 ENCOUNTER — Inpatient Hospital Stay: Admit: 2023-06-29 | Payer: Medicare (Managed Care)

## 2023-06-29 DIAGNOSIS — E611 Iron deficiency: Secondary | ICD-10-CM

## 2023-06-29 LAB — CBC WITH AUTO DIFFERENTIAL
Basophils %: 0.7 % (ref 0–2)
Basophils Absolute: 0.05 10*3/uL (ref 0.0–0.1)
Eosinophils %: 2.4 % (ref 0–5)
Eosinophils Absolute: 0.17 10*3/uL (ref 0.0–0.4)
Hematocrit: 23.3 % — ABNORMAL LOW (ref 37.0–47.0)
Hemoglobin: 6.8 g/dL — CL (ref 12.0–16.0)
Immature Granulocytes %: 0.6 % (ref 0.0–0.6)
Immature Granulocytes Absolute: 0.04 10*3/uL (ref 0.00–0.04)
Lymphocytes Absolute: 2.05 10*3/uL (ref 1.2–3.7)
Lymphocytes: 29.4 % (ref 14–46)
MCH: 22.9 pg — ABNORMAL LOW (ref 27.0–31.0)
MCHC: 29.2 g/dL — ABNORMAL LOW (ref 33.0–37.0)
MCV: 78.5 FL — ABNORMAL LOW (ref 80.0–94.0)
MPV: 9.5 FL (ref 7.4–10.4)
Monocytes %: 11 % (ref 5–12)
Monocytes Absolute: 0.77 10*3/uL (ref 0.2–1.0)
Neutrophils Absolute: 3.89 10*3/uL (ref 1.6–6.1)
Nucleated RBCs: 0 /100{WBCs}
Platelets: 404 10*3/uL — ABNORMAL HIGH (ref 130–400)
RBC: 2.97 M/uL — ABNORMAL LOW (ref 4.20–5.40)
RDW: 19.6 % — ABNORMAL HIGH (ref 11.5–14.5)
Seg Neutrophils: 55.9 % (ref 47–80)
WBC: 7 10*3/uL (ref 4.5–10.9)
nRBC: 0 10*3/uL

## 2023-06-29 LAB — COMPREHENSIVE METABOLIC PANEL
ALT: 24 U/L (ref 12–78)
AST: 20 U/L (ref 10–37)
Albumin: 3.7 g/dL (ref 3.4–5.0)
Alk Phosphatase: 98 U/L (ref 43–117)
BUN: 25 mg/dL — ABNORMAL HIGH (ref 7–22)
CO2: 25 mmol/L (ref 21–32)
Calcium: 9.2 mg/dL (ref 8.5–10.1)
Chloride: 101 mmol/L (ref 98–108)
Creatinine: 0.94 mg/dL (ref 0.55–1.10)
Est, Glom Filt Rate: 65 mL/min/{1.73_m2} (ref >60)
Glucose: 86 mg/dL (ref 74–106)
Potassium: 4.6 mmol/L (ref 3.4–5.1)
Sodium: 132 mmol/L — ABNORMAL LOW (ref 136–145)
Total Bilirubin: 0.3 mg/dL (ref 0.00–1.00)
Total Protein: 7.2 g/dL (ref 6.4–8.2)

## 2023-06-29 LAB — URINALYSIS WITH REFLEX TO CULTURE
Bilirubin, Urine: NEGATIVE
Blood, Urine: NEGATIVE
Glucose, Ur: NORMAL mg/dL
Ketones, Urine: NEGATIVE mg/dL
Leukocyte Esterase, Urine: NEGATIVE uL
Nitrite, Urine: NEGATIVE
Protein, Urine: NEGATIVE mg/dL
Specific Gravity, UA: 1.012 (ref 1.008–1.030)
Urobilinogen, Urine: NORMAL EU/dL
pH, Urine: 6 (ref 5.0–8.0)

## 2023-06-29 LAB — IRON PROFILE
Iron % Saturation: 2 % — ABNORMAL LOW (ref 15–50)
Iron: 12 ug/dL — ABNORMAL LOW (ref 50–170)
TIBC: 482 ug/dL — ABNORMAL HIGH (ref 250–450)

## 2023-06-29 LAB — SEDIMENTATION RATE: Sed Rate, Automated: 32 mm/h — ABNORMAL HIGH (ref 0–30)

## 2023-06-29 LAB — TSH: TSH: 1.21 u[IU]/mL (ref 0.358–3.740)

## 2023-06-29 MED ORDER — LORAZEPAM 0.5 MG PO TABS
0.5 | ORAL_TABLET | ORAL | 0 refills | 14.50000 days | Status: DC
Start: 2023-06-29 — End: 2023-08-25

## 2023-06-29 NOTE — Telephone Encounter (Signed)
 After hours call about her anemia. Patient hasn't been taking her iron  for months and is feeling tired. She is very anemic. I called and had to LM for her at her number about does she want to restart her oral iron  or - my preference- give her a couple of iron  transfusions first. Rest of her labs are pending.

## 2023-06-29 NOTE — Telephone Encounter (Signed)
 Due now     CONTROLLED SUBSTANCE REFILL REQUEST    CLINICAL STAFF ACTION: PMP/PDMP was reviewed and appropriate, CSA and UDS are NOT up to date. These will be requested at upcoming appointment.  Rx was pended for provider to sign.    Requested Prescriptions     Pending Prescriptions Disp Refills    LORazepam  (ATIVAN ) 0.5 MG tablet [Pharmacy Med Name: LORAZEPAM  0.5MG  TABLETS] 30 tablet      Sig: TAKE 1 TABLET BY MOUTH EVERY NIGHT AS NEEDED FOR ANXIETY. MAX DAILY AMOUNT: 0.5 MG       MEDICATION #1  Next due date:  06/10/23  Last prescription refill date: 05/11/23  Number of pills given with last prescription: 30  Maximum pills per day: 1    Last office visit: 07/25/2022     Future Appointments   Date Time Provider Department Center   07/28/2023  3:00 PM Lamount Pimple, MD [facility]        Controlled Substance Flowsheet (last value only)      06/29/2023     3:32 PM   [facility] Controlled Substance Flowsheet   Medication contract reviewed and signed 09/16/2021   PMP Reviewed 06/29/2023         06/29/2023     3:32 PM   [place] Controlled Substances   Medication contract reviewed and signed 09/16/2021   PMP Reviewed 06/29/2023         Name from pharmacy: LORAZEPAM  0.5MG  TABLETS         Will file in chart as: LORazepam  (ATIVAN ) 0.5 MG tablet    Sig: TAKE 1 TABLET BY MOUTH EVERY NIGHT AS NEEDED FOR ANXIETY. MAX DAILY AMOUNT: 0.5 MG    Disp: 30 tablet    Refills: Not specified    Start: 06/25/2023    Class: Normal    PDMP Review May Be Needed    Non-formulary For: Anxiety    Last ordered: 1 month ago (05/11/2023) by Donis Furnish, MD    Last refill: 05/11/2023    Rx #: [id number]    Controlled Medication Protocol Passed05/02/2023 02:21 PM   Protocol Details Visit with authorizing provider in past 3 months or upcoming 3 months    Medication not refilled in past 28 days      To be filled at: [facility] [phone] - [place], [place] - [address] - [phone] - F [phone]       Documented by: Vannie Gentile, CMA

## 2023-06-30 ENCOUNTER — Encounter

## 2023-06-30 LAB — VITAMIN D 25 HYDROXY: Vit D, 25-Hydroxy: 15.3 ng/mL

## 2023-06-30 LAB — FOLATE: Folate: 16.7 ng/mL (ref 3.1–17.5)

## 2023-06-30 LAB — VITAMIN B12: Vitamin B-12: 316 pg/mL (ref 193–986)

## 2023-06-30 NOTE — Telephone Encounter (Signed)
 Would defer to Dr. Joesphine Must whom will be here tomorrow. I have never ordered

## 2023-06-30 NOTE — Telephone Encounter (Signed)
 Pt called requesting a call back please to review her lab results.

## 2023-06-30 NOTE — Telephone Encounter (Signed)
 Why would insurance pay for iron  infusions for someone who has simply not complied with therapy ordered. She must first fail at oral therapy. Patient did try oral therapy but had GI upset so did not take it. She discussed with Dr. Joesphine Must Friday who ordered slow release iron  and she has not picked up as it will be ready today. Should we order a transfusion as last year in June at 6.0 we transfused. After the transfusion she can go on the slow FE and if she does not tolerate will likely need iron  infusions.

## 2023-07-01 ENCOUNTER — Encounter

## 2023-07-01 ENCOUNTER — Telehealth

## 2023-07-01 MED ORDER — VITAMIN D (ERGOCALCIFEROL) 1.25 MG (50000 UT) PO CAPS
1.25 | ORAL_CAPSULE | ORAL | 0 refills | 80.50000 days | Status: DC
Start: 2023-07-01 — End: 2023-08-24

## 2023-07-01 NOTE — Telephone Encounter (Signed)
 Infusion Center calls.  Received order for prolia.  They need Type in /  CBC   Please call [phone] for questions

## 2023-07-01 NOTE — Telephone Encounter (Addendum)
 Shelly Perkins agrees to a blood transfusion and all risks and benefits reviewed. She had a transfusion last year and knows she will feel better. She did pick up her iron  last night and started. She understands she needs to let me know ASAP if she cannot tolerate this formulary as she will then need iron  infusions. I called infusion and they can get her in Friday at 0800. They blocked the spot. If you want it done today it will be a trip to the ED. Can she just take the iron  until Friday AM and then they will do hemogram and if <7.0 can transfuse?

## 2023-07-01 NOTE — Telephone Encounter (Signed)
 Orders were signed and faxed. Spoke with Devone who will arrive at [facility] around 0745 Friday to check in at patient registration.

## 2023-07-01 NOTE — Telephone Encounter (Signed)
 Think a blood transfusion is reasonable. Her iron  is really low as she hasn't been taking it.

## 2023-07-01 NOTE — Telephone Encounter (Signed)
 Noted.

## 2023-07-03 ENCOUNTER — Inpatient Hospital Stay: Admit: 2023-07-03 | Discharge: 2023-07-03 | Payer: Medicare (Managed Care)

## 2023-07-03 ENCOUNTER — Encounter

## 2023-07-03 VITALS — BP 137/76 | HR 61 | Temp 97.80000°F | Resp 16

## 2023-07-03 DIAGNOSIS — D508 Other iron deficiency anemias: Secondary | ICD-10-CM

## 2023-07-03 LAB — CBC WITH AUTO DIFFERENTIAL
Basophils %: 0.4 % (ref 0–2)
Basophils Absolute: 0.03 10*3/uL (ref 0.0–0.1)
Eosinophils %: 1.8 % (ref 0–5)
Eosinophils Absolute: 0.14 10*3/uL (ref 0.0–0.4)
Hematocrit: 24 % — ABNORMAL LOW (ref 37.0–47.0)
Hemoglobin: 7 g/dL — ABNORMAL LOW (ref 12.0–16.0)
Immature Granulocytes %: 1.8 % — ABNORMAL HIGH (ref 0.0–0.6)
Immature Granulocytes Absolute: 0.14 10*3/uL — ABNORMAL HIGH (ref 0.00–0.04)
Lymphocytes Absolute: 1.19 10*3/uL — ABNORMAL LOW (ref 1.2–3.7)
Lymphocytes: 15.3 % (ref 14–46)
MCH: 22.6 pg — ABNORMAL LOW (ref 27.0–31.0)
MCHC: 29.2 g/dL — ABNORMAL LOW (ref 33.0–37.0)
MCV: 77.4 FL — ABNORMAL LOW (ref 80.0–94.0)
MPV: 8.6 FL (ref 7.4–10.4)
Monocytes %: 7.7 % (ref 5–12)
Monocytes Absolute: 0.6 10*3/uL (ref 0.2–1.0)
Neutrophils Absolute: 5.67 10*3/uL (ref 1.6–6.1)
Nucleated RBCs: 0 /100{WBCs}
Platelets: 404 10*3/uL — ABNORMAL HIGH (ref 130–400)
RBC: 3.1 M/uL — ABNORMAL LOW (ref 4.20–5.40)
RDW: 19.9 % — ABNORMAL HIGH (ref 11.5–14.5)
Seg Neutrophils: 73 % (ref 47–80)
WBC: 7.8 10*3/uL (ref 4.5–10.9)
nRBC: 0 10*3/uL

## 2023-07-03 MED ORDER — SODIUM CHLORIDE 0.9 % IV SOLN
0.9 | INTRAVENOUS | Status: DC | PRN
Start: 2023-07-03 — End: 2023-07-04

## 2023-07-03 MED ORDER — ACETAMINOPHEN 325 MG PO TABS
325 | Freq: Once | ORAL | Status: DC
Start: 2023-07-03 — End: 2023-07-06

## 2023-07-03 MED ORDER — DIPHENHYDRAMINE HCL 25 MG PO TABS
25 | Freq: Once | ORAL | Status: DC
Start: 2023-07-03 — End: 2023-07-06

## 2023-07-03 MED ORDER — NORMAL SALINE FLUSH 0.9 % IV SOLN
0.9 | INTRAVENOUS | Status: DC | PRN
Start: 2023-07-03 — End: 2023-07-04

## 2023-07-03 MED ORDER — SODIUM CHLORIDE 0.9 % IV SOLN
0.9 | INTRAVENOUS | Status: DC
Start: 2023-07-03 — End: 2023-07-06

## 2023-07-03 MED FILL — SODIUM CHLORIDE 0.9 % IV SOLN: 0.9 % | INTRAVENOUS | Qty: 1000 | Fill #0

## 2023-07-03 NOTE — Progress Notes (Signed)
 INFUSION  / INJECTION ASSESSMENT       COLD OR FLU LIKE SYMPTOMS:  NO    DO YOU HAVE ANY CHEST PAIN, PRESSURE OR  PALPITATIONS: NO    DO YOU HAVE ANY UNUSUAL BLEEDING OR EASY BRUISING: Yes, r/t medication    EDEMA - DO YOU HAVE ANY SWELLING IN YOUR EXTREMITIES:   UPPER EXTREMITIES: B/L hands  LOWER EXTREMITIES: NONE    DO YOU HAVE SHORTNESS OF BREATH OR TROUBLE BREATHING AT REST: NO  DO YOU HAVE SHORTNESS OF BREATH OR TROUBLE BREATHING WITH EXERTION: Yes    LUNG SOUNDS: N/A    DO YOU HAVE A COUGH: dry cough r/t smoking    IS YOUR COUGH PRODUCTIVE: N/A  COLOR AND CONSISTENCY IS THE SPUTUM:  N/A  WHAT IS THE FREQUENCY:  N/A    DO YOU HAVE ANY BLOATING, CRAMPING, OR ABDOMINAL PAIN:  NO    DO YOU HAVE ANY OPEN WOUNDS OR SORES: NO    DO YOU HAVE ANY THOUGHTS OF HARMING YOURSELF OR ANYONE ELSE?No      ADDITIONAL COMMENTS:

## 2023-07-03 NOTE — Progress Notes (Signed)
 Patient in for CBC and possible blood transfusion. Blood drawn using butterfly needle in right forearm, gauze and tape applied. Blood sent to lab. Awaiting results.          Hemoglobin Quant 12.0 - 16.0 g/dL 7.0 (L)   (L): Data is abnormally low    PIV placed by BB RN as this RN missed twice. T&S drawn and sent to lab. Lab called to state that patient has an antibody. T&S never completed and resulted until 11:26. I called t F/U with BB to see when blood would be ready they told me soon. I kept updating the patient as we went along. Once I saw the T&S result I went in to get another set of VS and the patient stated that she was not staying for the transfusion. Patient stated that she had been here for too long. I explained that it was due to the antibody that she had developed. At this time the BB called to stated the blood was ready and I told the CNA that the patient no longer wanted it.     Patient asked if she could go outside for a few minutes, I told her she could not. She stated that she was leaving then. Patient was really anxious. I printed out education on type and screen for blood work for her to take. I left her blood band on and educated her to leave it on for 72 hours in case she needs the blood urgently. To come to [facility]. Patient was very thankful, and stated that she just could no longer sit here for another 2 + hours.     PIV removed.

## 2023-07-04 LAB — PREPARE RBC (CROSSMATCH)

## 2023-07-06 NOTE — Telephone Encounter (Signed)
-----   Message from Dr. Floyd Hutchinson, MD sent at 07/03/2023  5:47 PM EDT -----  Check cbc next week.

## 2023-07-07 LAB — TYPE AND SCREEN
ABO/Rh: O NEG
Antibody Screen: POSITIVE
Direct antiglobulin test.IgG specific reagent RBC ACnc Pt: NEGATIVE
Unit Divison: 0
Unit Divison: 0

## 2023-07-15 ENCOUNTER — Inpatient Hospital Stay: Admit: 2023-07-15 | Payer: Medicare (Managed Care)

## 2023-07-15 DIAGNOSIS — D508 Other iron deficiency anemias: Secondary | ICD-10-CM

## 2023-07-16 LAB — CBC
Hematocrit: 30.3 % — ABNORMAL LOW (ref 37.0–47.0)
Hemoglobin: 9.1 g/dL — ABNORMAL LOW (ref 12.0–16.0)
MCH: 27.2 pg (ref 27.0–31.0)
MCHC: 30 g/dL — ABNORMAL LOW (ref 33.0–37.0)
MCV: 90.7 FL (ref 80.0–94.0)
MPV: 10.2 FL (ref 7.4–10.4)
Nucleated RBCs: 0 /100{WBCs}
Platelets: 355 10*3/uL (ref 130–400)
RBC: 3.34 M/uL — ABNORMAL LOW (ref 4.20–5.40)
RDW: UNDETERMINED % (ref 11.5–14.5)
WBC: 5.8 10*3/uL (ref 4.5–10.9)
nRBC: 0 10*3/uL

## 2023-07-17 ENCOUNTER — Encounter

## 2023-07-28 ENCOUNTER — Ambulatory Visit: Payer: Medicare (Managed Care) | Attending: Family Medicine

## 2023-08-18 ENCOUNTER — Encounter

## 2023-08-20 ENCOUNTER — Inpatient Hospital Stay: Admit: 2023-08-20 | Payer: Medicare (Managed Care)

## 2023-08-20 DIAGNOSIS — E559 Vitamin D deficiency, unspecified: Principal | ICD-10-CM

## 2023-08-20 LAB — CBC
Hematocrit: 30.9 % — ABNORMAL LOW (ref 37.0–47.0)
Hemoglobin: 10.1 g/dL — ABNORMAL LOW (ref 12.0–16.0)
MCH: 30.1 pg (ref 27.0–31.0)
MCHC: 32.7 g/dL — ABNORMAL LOW (ref 33.0–37.0)
MCV: 92.2 FL (ref 80.0–94.0)
MPV: 9.6 FL (ref 7.4–10.4)
Nucleated RBCs: 0 /100{WBCs}
Platelets: 328 10*3/uL (ref 130–400)
RBC: 3.35 M/uL — ABNORMAL LOW (ref 4.20–5.40)
RDW: UNDETERMINED % (ref 11.5–14.5)
WBC: 6.8 10*3/uL (ref 4.5–10.9)
nRBC: 0 10*3/uL

## 2023-08-20 LAB — IRON PROFILE
Iron % Saturation: 22 % (ref 15–50)
Iron: 80 ug/dL (ref 50–170)
TIBC: 357 ug/dL (ref 250–450)

## 2023-08-21 LAB — VITAMIN D 25 HYDROXY: Vit D, 25-Hydroxy: >150 ng/mL

## 2023-08-24 ENCOUNTER — Encounter

## 2023-08-25 MED ORDER — LORAZEPAM 0.5 MG PO TABS
0.5 | ORAL_TABLET | ORAL | 0 refills | 30.00000 days | Status: DC
Start: 2023-08-25 — End: 2023-11-18

## 2023-08-25 NOTE — Telephone Encounter (Signed)
 Due 07/30/23    CONTROLLED SUBSTANCE REFILL REQUEST    CLINICAL STAFF ACTION: PMP/PDMP was reviewed and appropriate, CSA and UDS are NOT up to date. These will be requested at upcoming appointment.  Rx was pended for provider to sign.    Requested Prescriptions     Pending Prescriptions Disp Refills    LORazepam  (ATIVAN ) 0.5 MG tablet [Pharmacy Med Name: LORAZEPAM  0.5MG  TABLETS] 30 tablet      Sig: TAKE 1 TABLET BY MOUTH EVERY NIGHT AS NEEDED FOR ANXIETY. MAX DAILY AMOUNT: 0.5 MG       MEDICATION #1  Next due date:  07/30/23  Last prescription refill date: 06/30/23  Number of pills given with last prescription: 30  Maximum pills per day: 1    Last office visit:  06/26/23    No future appointments. - but on wait list for new provider    Controlled Substance Flowsheet (last value only)      08/25/2023     2:45 PM   [facility] Controlled Substance Flowsheet   Medication contract reviewed and signed 09/16/2021   PMP Reviewed 08/25/2023         08/25/2023     2:45 PM   [place] Controlled Substances   Medication contract reviewed and signed 09/16/2021   PMP Reviewed 08/25/2023            Documented by: Dresden Ament M Darnella Zeiter, CMA

## 2023-09-07 NOTE — Telephone Encounter (Signed)
 Pt name and DOB verified.    Pt calling, Pt returning call. This Clinical research associate unsuccessful in reaching Clinical Team.    [phone] (home)

## 2023-09-16 ENCOUNTER — Ambulatory Visit: Admit: 2023-09-16 | Discharge: 2023-09-16 | Payer: Medicare (Managed Care)

## 2023-09-16 VITALS — BP 118/58 | HR 64 | Resp 18 | Ht 62.0 in | Wt 90.0 lb

## 2023-09-16 DIAGNOSIS — R1031 Right lower quadrant pain: Principal | ICD-10-CM

## 2023-09-16 NOTE — Assessment & Plan Note (Addendum)
 Etiology unclear : thought to be blood loss ( on anticoagulation) and chronic disease related inflammation. She has required blood transfusion in the past. Was scheduled for blood transfusion early this year but has declined. She continues on iron  tablet. HB level has improved slightly. She has no symptoms.

## 2023-09-16 NOTE — Assessment & Plan Note (Signed)
 Non symptomatic. She is on Lopressor  25 mg po daily for rate control and HR is at target today. She has anticoagulation with Eliquis .

## 2023-09-16 NOTE — Assessment & Plan Note (Signed)
 No incarcerated hernia suspected on physical exam. No lymph node palpated. I am suspected muscle strain vs. Soft tissue swelling. I have order an ultrasound.

## 2023-09-16 NOTE — Assessment & Plan Note (Signed)
 She is on Lorazepam . CSA and UDS done today.

## 2023-09-16 NOTE — Progress Notes (Signed)
 CHIEF COMPLAINT   Shelly Perkins is a 73 y.o. female who presents today for follow-up of Groin Pain (Patient stated it feels like she has a lump in groin for past 3 weeks and it is now painful.)    HISTORY OF PRESENT ILLNESS   Shelly Perkins is a 73 y.o. female with history of atrial fibrillation, anemia, chronic pain, hypertension, GERD, hyperlipidemia and insomnia.     She smokes cigarette.     Patient here today for acute groin pain since days. She has known chronic back and hip pain but stated this is different. She has pain on right groin palpation and can feel a painful lump. She has no trauma on her pelvis. No weight lifting history. No history of inguinal hernia.     REVIEW OF SYSTEMS     Neurologic: Denies headache, dizziness, syncope or tremors.     Eyes:  No change in vision.   Ears:  No decreased hearing.  No tinnitus.   Nose:  No rhinorrhea or congestion   Oropharynx:  No sores, dysphagia, dental pain, or sore throat   Pulmonary:  Denies cough or shortness of breath.   Cardiac:  Denies chest pain, palpitations, or peripheral edema.   GI:  Denies abdominal pain, nausea, emesis, constipation, diarrhea   GU:  Denies dysuria, hematuria, weak stream, vaginal discharge, or nocturia.    Endocrine:  Denies polyuria, polydipsia, or weight change  Musculoskeletal:  Denies any new myalgias, joint pain, or joint swelling  Psychiatric:  Denies depression, anxiety, or excessive irritability    PHYSICAL EXAM   General:  Pleasant, well-dressed and groomed, good historian  Heart: irregular rate and rhythm without murmur  Lungs: clear to auscultation  Abdomen: tenderness on right groin with swelling tissue, no lymph node or hard mass palpated, no changes when coughing  Extremities: no edema, pulses are normal  Musculoskeletal:  No joint swelling or tenderness. Normal gait.    VITALS     Vitals:    09/16/23 1256   BP: (!) 118/58   BP Site: Right Upper Arm   Patient Position: Sitting   BP Cuff Size: Medium Adult    Pulse: 64   Resp: 18   SpO2: 100%   Weight: 40.8 kg (90 lb)   Height: 1.575 m (5' 2)    - Body mass index is 16.46 kg/m.    MEDICATIONS     Current Outpatient Medications   Medication Sig    LORazepam  (ATIVAN ) 0.5 MG tablet TAKE 1 TABLET BY MOUTH EVERY NIGHT AS NEEDED FOR ANXIETY. MAX DAILY AMOUNT: 0.5 MG    ferrous sulfate dried (SLOW RELEASE IRON ) 160 (50 Fe) MG TBCR extended release tablet Take 160 mg by mouth 2 times daily    ELIQUIS  5 MG TABS tablet TAKE 1 TABLET TWICE DAILY    metoprolol  tartrate (LOPRESSOR ) 25 MG tablet TAKE 1 TABLET TWICE DAILY    irbesartan  (AVAPRO ) 150 MG tablet TAKE 1 TABLET EVERY NIGHT    pantoprazole  (PROTONIX ) 40 MG tablet Take 1 tablet by mouth in the morning and at bedtime     No current facility-administered medications for this visit.     There are no discontinued medications.    ALLERGIES   No Known Allergies  ACTIVE MEDICAL PROBLEMS     Patient Active Problem List   Diagnosis    Normal routine history and physical examination    Tobacco abuse disorder    Anemia    Anxiety    Other insomnia  Pain of upper abdomen    Paroxysmal atrial fibrillation (HCC)    Cervicalgia    Chronic left hip pain    Chronic left shoulder pain    Chronic left-sided low back pain with left-sided sciatica    Essential hypertension    Foraminal stenosis of lumbar region    Hypercholesteremia    Symptomatic menopausal or female climacteric states    Chronic right-sided low back pain with right-sided sciatica    Gastroesophageal reflux disease without esophagitis    Myalgia    Radiculopathy, lumbosacral region    Lumbar radiculopathy    Iron  deficiency    Chills    Other fatigue    Right groin pain     SOCIAL HISTORY     Social History     Social History Narrative    Not on file       ASSESSMENT AND PLAN     1. Right groin pain  Assessment & Plan:   No incarcerated hernia suspected on physical exam. No lymph node palpated. I am suspected muscle strain vs. Soft tissue swelling. I have order an  ultrasound.   Orders:  -     US  EXTREMITY RIGHT NON VASC LIMITED; Future  2. Anemia, unspecified type  Assessment & Plan:  Etiology unclear : thought to be blood loss ( on anticoagulation) and chronic disease related inflammation. She has required blood transfusion in the past. Was scheduled for blood transfusion early this year but has declined. She continues on iron  tablet. HB level has improved slightly. She has no symptoms.   3. Paroxysmal atrial fibrillation (HCC)  Assessment & Plan:   Non symptomatic. She is on Lopressor  25 mg po daily for rate control and HR is at target today. She has anticoagulation with Eliquis .   4. Other insomnia  Assessment & Plan:   She is on Lorazepam . CSA and UDS done today.        Follow up:  No follow-ups on file.     Future Appointments   Date Time Provider Department Center   11/04/2023  2:00 PM Delene Jerelene CROME, MD [facility]       Jerelene CROME Delene, MD  09/16/2023

## 2023-09-25 NOTE — Telephone Encounter (Signed)
 Call received from [facility] . Pt is scheduled for Colonoscopy on 10/07/23. They need instructions for hold on Eliquis . Fax to [phone]. Mahima Hottle,G RN 09/25/2023 2:25 PM

## 2023-09-28 NOTE — Telephone Encounter (Addendum)
 Ellouise from [facility] [place]  endoscopy department calling for instructions for patient's procedure on August 13th.  Instructions located from [facility].   They will need a Physical signature on the instructions.  Printed and placed on Dr.Elisha's desk as they need a Physical signature. Fax to [phone]. Nikie Cid,G RN 09/28/2023 2:25 PM     Tina call back and asked to have instructions faxed so that they have confirmation, letter faxed to the number provided [phone]. Lashica Hannay,G RN 09/28/2023 2:52 PM

## 2023-09-28 NOTE — Telephone Encounter (Signed)
 Hold day before cscope  Hold day of cscope  Resume day after cscope unless there was significant bleeding during cscope

## 2023-09-29 NOTE — Telephone Encounter (Signed)
 Noted this was completed by PCP.

## 2023-10-01 ENCOUNTER — Inpatient Hospital Stay: Admit: 2023-10-01 | Payer: Medicare (Managed Care)

## 2023-10-01 DIAGNOSIS — E559 Vitamin D deficiency, unspecified: Principal | ICD-10-CM

## 2023-10-01 DIAGNOSIS — R1031 Right lower quadrant pain: Principal | ICD-10-CM

## 2023-10-01 LAB — CBC
Hematocrit: 29.8 % — ABNORMAL LOW (ref 37.0–47.0)
Hemoglobin: 9.7 g/dL — ABNORMAL LOW (ref 12.0–16.0)
MCH: 31.6 pg — ABNORMAL HIGH (ref 27.0–31.0)
MCHC: 32.6 g/dL — ABNORMAL LOW (ref 33.0–37.0)
MCV: 97.1 FL — ABNORMAL HIGH (ref 80.0–94.0)
MPV: 8.7 FL (ref 7.4–10.4)
Nucleated RBCs: 0 /100{WBCs}
Platelets: 314 K/uL (ref 130–400)
RBC: 3.07 M/uL — ABNORMAL LOW (ref 4.20–5.40)
RDW: 14.5 % (ref 11.5–14.5)
WBC: 6.7 K/uL (ref 4.5–10.9)
nRBC: 0 K/uL

## 2023-10-01 LAB — IRON PROFILE
Iron % Saturation: 11 % — ABNORMAL LOW (ref 15–50)
Iron: 39 ug/dL — ABNORMAL LOW (ref 50–170)
TIBC: 361 ug/dL (ref 250–450)

## 2023-10-01 LAB — VITAMIN D 25 HYDROXY: Vit D, 25-Hydroxy: 63.2 ng/mL

## 2023-10-01 NOTE — Other (Signed)
 Please notify patient that her ultrasound report no abnormalities.

## 2023-10-07 LAB — HM COLONOSCOPY: Colonoscopy, External: 1

## 2023-11-04 ENCOUNTER — Ambulatory Visit: Admit: 2023-11-04 | Discharge: 2023-11-04 | Payer: Medicare (Managed Care)

## 2023-11-04 ENCOUNTER — Telehealth

## 2023-11-04 NOTE — Telephone Encounter (Signed)
 Shelly Perkins  1950/08/03  Type of referral: internal/external/ ED       INT   Who is the referring office and referring provider  Delene Jerelene CROME, MD       Were you seen in the ER (if yes, when and where) No    Date of Injury   chronic issue           What do you need to be seen for: Left/right/bilateral  Pain in both knees    Have you had xrays in the last 6 months?     No        Where:            If not within 6 months please schedule (Note if a minor NP we do not schedule an xray, unless provider reviews chart and requests xray)          Have you had MRI within 6 months:  No         If yes where:            Have you had an EMG?  No          Where?             Is this going through the [place]?  No          If yes, please verify that we have an up to date referral with correct diagnosis.            Workers comp Yes/No    No        If yes, please bring WC info with you to appointment.              Have you done Physical/Occupational Therapy?   No          Where?                     Have you had prior surgery before for this diagnosis?  No    With who/where?           Have you had a total joint replacement (hip or knee)?        (If yes please book with [facility] )    Have you seen prior Ortho provider before?      No       If yes who?                   Please get a copy of previous records to bring with you to appointment.   Did scheduler request records?                     Please note our office is a scent neutral zone, please refrain from wearing perfume, cologne, and/or other fragrances.    NP appt scheduled for 11/23/23 at 2:30pm with BK. Xray prior.    [phone] (home)

## 2023-11-04 NOTE — Telephone Encounter (Signed)
 Noted,x-rays ordered

## 2023-11-04 NOTE — Progress Notes (Signed)
 MEDICARE ANNUAL WELLNESS VISIT       CHIEF COMPLAINT   Shelly Perkins is a 73 y.o. female who presents today for Medicare AWV     HISTORY OF PRESENT ILLNESS     History of Present Illness  Shelly Perkins  is a 73 year old female with a history of anemia, hypertension, and GERD who presents for follow-up on her anemia and knee pain.    She reports persistent fatigue, which she attributes to her constant pain or fluctuating iron  levels. She continues to take iron  supplements, although at a reduced dosage , as the higher dose caused stomach discomfort. She also takes vitamin C with her iron  pills. She has not received intravenous iron  infusions in the past.    She experiences pain in her right knee, which occasionally gives out, making it difficult for her to navigate stairs. This issue has been ongoing for nearly a year. She also reports similar symptoms starting in her left knee. She has not consulted an orthopedic specialist recently. She recalls receiving a set of injections in her back from a physiatrist, which unfortunately did not alleviate her symptoms.     She is currently on anticoagulation therapy for atrial fibrillation but does not experience palpitations. Occasionally, she notices a slight increase in her heart rate, but this is brief and does not cause her distress.      ANEMIA:      Fatigue:  Yes  Blood in the stools:  No  Black stools:  Yes   Current treatment: Ferrous sulfate ER 160 mg tab once daily      Lab Results   Component Value Date    HGB 9.7 (L) 10/01/2023    HGB 10.1 (L) 08/20/2023    HGB 9.1 (L) 07/15/2023    HGB 7.0 (L) 07/03/2023    MCV 97.1 (H) 10/01/2023    MCV 92.2 08/20/2023    FERRITIN 12.8 11/20/2022    IRON  39 (L) 10/01/2023    IRON  80 08/20/2023    IRONPERSAT 11 (L) 10/01/2023    IRONPERSAT 22 08/20/2023    VITAMINB12 316 06/29/2023    VITAMINB12 335 11/20/2022    VITAMINB12 362 07/29/2022    FOLATE 16.7 06/29/2023    FOLATE 15.1 11/20/2022        REVIEW OF SYSTEMS      Neurologic: Denies headache, dizziness, syncope or tremors.     Eyes:  No change in vision.   Ears:  She has reported some hearing decrease.  No tinnitus.   Nose:  No rhinorrhea or congestion   Oropharynx:  No sores, dysphagia, dental pain, or sore throat   Pulmonary:  Denies cough or shortness of breath.   Cardiac:  Denies chest pain, palpitations, or peripheral edema.   GI:  Denies abdominal pain, nausea, emesis. She has diarrhea with blood in the last 2 days. She has been out in a camp recently. Has been improving.   GU:  Denies dysuria, hematuria, weak stream, vaginal discharge, or nocturia.    Endocrine:  Denies polyuria, polydipsia, or weight change  Musculoskeletal:  She has back pain and knee pain  Psychiatric:  She has anxiety and has Lorazepam  as needed    PHYSICAL EXAM   General:  Pleasant, well-dressed and groomed, good historian  Eyes: PERRLA, non-injected  Ears: tympanic membranes are non-erythematous and in the neutral position, EAM without erythema or discharge  Nose: patent  Oropharynx: oral mucosa moist without lesions, no pharyngeal injection, tonsillar hypertrophy or exudate  Neck:  no adenopathy, thyromegaly, masses, or bruits  Heart: regular rate and rhythm without murmur  Lungs: clear to auscultation  Abdomen: soft, nontender, no masses or organomegaly  Extremities: no edema, pulses are normal  Musculoskeletal:  No joint swelling or tenderness. Normal gait.  Neurologic:  CNII-XII intact. Normal strength, sensation and reflexes throughout.  Skin:  No rash or suspicious skin lesions    VITALS     Vitals:    11/04/23 1425 11/04/23 1428   BP: 136/62 132/60   BP Site: Left Upper Arm Right Upper Arm   Patient Position: Sitting Sitting   BP Cuff Size: Medium Adult Medium Adult   Pulse: 72    SpO2: 100%    Weight: 40.1 kg (88 lb 6.4 oz)    Height: 1.549 m (5' 1)     - Body mass index is 16.7 kg/m.    MEDICATIONS     Current Outpatient Medications   Medication Sig    Ferrous Sulfate ER 50 MG TBCR  Take 1 tablet by mouth daily    LORazepam  (ATIVAN ) 0.5 MG tablet TAKE 1 TABLET BY MOUTH EVERY NIGHT AS NEEDED FOR ANXIETY. MAX DAILY AMOUNT: 0.5 MG    ferrous sulfate dried (SLOW RELEASE IRON ) 160 (50 Fe) MG TBCR extended release tablet Take 160 mg by mouth 2 times daily    ELIQUIS  5 MG TABS tablet TAKE 1 TABLET TWICE DAILY    metoprolol  tartrate (LOPRESSOR ) 25 MG tablet TAKE 1 TABLET TWICE DAILY    irbesartan  (AVAPRO ) 150 MG tablet TAKE 1 TABLET EVERY NIGHT    pantoprazole  (PROTONIX ) 40 MG tablet Take 1 tablet by mouth in the morning and at bedtime     No current facility-administered medications for this visit.     There are no discontinued medications.    ALLERGIES   No Known Allergies  ACTIVE MEDICAL PROBLEMS     Patient Active Problem List   Diagnosis    Normal routine history and physical examination    Tobacco abuse disorder    Anemia    Anxiety    Other insomnia    Pain of upper abdomen    Paroxysmal atrial fibrillation (HCC)    Cervicalgia    Chronic left hip pain    Chronic left shoulder pain    Chronic left-sided low back pain with left-sided sciatica    Essential hypertension    Foraminal stenosis of lumbar region    Hypercholesteremia    Symptomatic menopausal or female climacteric states    Chronic right-sided low back pain with right-sided sciatica    Gastroesophageal reflux disease without esophagitis    Myalgia    Radiculopathy, lumbosacral region    Lumbar radiculopathy    Iron  deficiency    Chills    Other fatigue    Right groin pain     PAST SURGICAL HISTORY     Past Surgical History:   Procedure Laterality Date    APPENDECTOMY      CESAREAN SECTION      NECK SURGERY      fusion     SOCIAL HISTORY     Social History     Social History Narrative    Not on file     FAMILY HISTORY     Family History   Problem Relation Age of Onset    Cancer Father        SCREENING  QUESTIONNAIRES     DEPRESSION SCREENING  Severity:  1-4 low risk; 5-9 mild depressive symptoms;  10-14 mild major depression; 15-19  moderate major depression; 20-27 severe major depression      11/04/2023     2:22 PM   PHQ-9    Little interest or pleasure in doing things 0   Feeling down, depressed, or hopeless 0   PHQ-2 Score 0   PHQ-9 Total Score 0        MEDICARE ANNUAL WELLNESS VISIT HEALTH RISK ASSESSMENT & PLAN     Recommendations for Preventive Services Due: see orders and patient instructions/AVS.   Recommended screening schedule for the next 5-10 years is provided to the patient in written form: see Patient Instructions/AVS.   Patient's complete Health Risk Assessment and screening values have been reviewed and are found in Flowsheets. The following problems were reviewed today and where indicated follow up appointments were made and/or referrals ordered.     Positive Risk Factor Screenings with Interventions:       Tobacco Use:    Tobacco Use      Smoking status: Every Day        Packs/day: 0.25        Years: 0.3 packs/day for 51.7 years (12.9 ttl pk-yrs)        Types: Cigarettes        Start date: 1974      Smokeless tobacco: Never      Tobacco comments: Two packs per week 07/25/22      Interventions:  Patient declined any further intervention or treatment           General Health and ACP:  General  What matters most to you?: Being healthy  In general, how would you say your health is?: Fair  In the past 7 days, have you experienced any of the following: New or Increased Pain, New or Increased Fatigue, Loneliness, Social Isolation, Stress or Anger?: No  Do you have a Living Will?: (!) No    Advance Directives       Power of Attorney Living Will ACP-Advance Directive ACP-Power of Attorney    Not on File Not on File Not on File Not on File        General Health Risk Interventions    Activity, Diet, and Weight:    Abnormal BMI (obese):  Body mass index is 16.7 kg/m. (!) Abnormal      Health Habits/Nutrition Interventions:  She has anemia and encouraged to improve food intake    Vision Screen:  Do you have difficulty driving, watching  TV, or doing any of your daily activities because of your eyesight?: (!) Yes  Have you had an eye exam within the past year?: (!) No    Hearing/Vision Interventions:  She has declined audiology consult for now      Sexual Health: She  reports that she is not currently sexually active.    CARE TEAM   Patient Care Team:  Delene Jerelene CROME, MD as PCP - General (Internal Medicine)    LABS & IMAGING     Abstract on 10/09/2023   Component Date Value Ref Range Status    Colonoscopy, External 10/07/2023 1 polyp, AVM's, diverticulosis   Final     ASSESSMENT AND PLAN           1. Preventative health care  Assessment & Plan:  Flu and covid-19 vaccines : will think about it for this year. This not have any shot last year  Shingles vaccines , Pneumococcal vaccine and Dtap vaccine : Has declined for now  Breast cancer  screen: Last mammogram was done 03/01/2020 - will send a referral  Colorectal cancer screen: Last colonoscopy done 10/07/23 - 1 polyp removed ( adenomatous) - Recommendation is to repeat in 3 years  LDCT: declines  DEXA: She has Osteopenia reported in 2020. Has been on Vit D supplement. Will repeat DEXA this year.   Mood : has some anxiety, no depression  Weight : low BMI encouraged to maintain a good food intake  Cognition: No decline and she still drives  Fall: at risk due to her frailty    Orders:  -     Comprehensive Metabolic Panel w/ Reflex to MG; Future  -     Hemoglobin A1C; Future  -     Lipid Panel; Future  2. Pain in both knees, unspecified chronicity  Assessment & Plan:   - She reports significant pain in her right knee, which occasionally gives out, and some pain starting in her left knee.  - Likely knee osteoarthritis   - I have ordered a knee X-ray to further investigate the cause of her pain.   - Referrals to physiatry and orthopedics will be made for potential injection therapy and surgical intervention, respectively.  Orders:  -     XR KNEE LEFT (MIN 4 VIEWS); Future  -     Referral to [facility]  -     Referral to [facility]  3. Anemia, unspecified type  Assessment & Plan:  HB: 9.7 in August. She does feel tired but remains functional. She remains on iron  supplement. No major bleeding . She is on anticoagulation for atrial fibrillation.  We discuss IV iron  and she agrees to go ahead with this if iron  level does not improve in her labs results.   4. Insomnia, unspecified type  Assessment & Plan:  She will continue on Lorazepam . CSA and UDS are up to date  5. Paroxysmal atrial fibrillation [facility])  Assessment & Plan:  Occasionnal palpitations, she will continue on Metoprolol  25 mg po BID for rate control and anticoagulation with Eliquis  5 mg po BID.  6. Essential hypertension  Assessment & Plan:  BP: 132/60. At target  She will continue on her current treatment: Metoprolol  25 mg po BID and Irbesatan 150 mg po daily.   7. Breast cancer screening by mammogram  -     MAM TOMO DIGITAL SCREEN BILATERAL; Future  8. Asymptomatic menopausal state  -     DEXA BONE DENSITY AXIAL SKELETON; Future       Follow up:  Return in about 1 year (around 11/03/2024) for AWV.     No future appointments.  Jerelene LITTIE Cramp, MD  11/04/2023

## 2023-11-06 NOTE — Assessment & Plan Note (Signed)
 Occasionnal palpitations, she will continue on Metoprolol  25 mg po BID for rate control and anticoagulation with Eliquis  5 mg po BID.

## 2023-11-06 NOTE — Other (Signed)
 Pt was seen on 11/04/23. Dp you still want her to complete labs?

## 2023-11-06 NOTE — Assessment & Plan Note (Signed)
 HB: 9.7 in August. She does feel tired but remains functional. She remains on iron  supplement. No major bleeding . She is on anticoagulation for atrial fibrillation.  We discuss IV iron  and she agrees to go ahead with this if iron  level does not improve in her labs results.

## 2023-11-06 NOTE — Assessment & Plan Note (Signed)
 Flu and covid-19 vaccines : will think about it for this year. This not have any shot last year  Shingles vaccines , Pneumococcal vaccine and Dtap vaccine : Has declined for now  Breast cancer screen: Last mammogram was done 03/01/2020 - will send a referral  Colorectal cancer screen: Last colonoscopy done 10/07/23 - 1 polyp removed ( adenomatous) - Recommendation is to repeat in 3 years  LDCT: declines  DEXA: She has Osteopenia reported in 2020. Has been on Vit D supplement. Will repeat DEXA this year.   Mood : has some anxiety, no depression  Weight : low BMI encouraged to maintain a good food intake  Cognition: No decline and she still drives  Fall: at risk due to her frailty

## 2023-11-06 NOTE — Assessment & Plan Note (Signed)
-   She reports significant pain in her right knee, which occasionally gives out, and some pain starting in her left knee.  - Likely knee osteoarthritis   - I have ordered a knee X-ray to further investigate the cause of her pain.   - Referrals to physiatry and orthopedics will be made for potential injection therapy and surgical intervention, respectively.

## 2023-11-07 ENCOUNTER — Inpatient Hospital Stay: Admit: 2023-11-07 | Payer: Medicare (Managed Care)

## 2023-11-07 DIAGNOSIS — D649 Anemia, unspecified: Principal | ICD-10-CM

## 2023-11-07 DIAGNOSIS — Z0000 Encounter for general adult medical examination without abnormal findings: Secondary | ICD-10-CM

## 2023-11-07 LAB — COMPREHENSIVE METABOLIC PANEL W/ REFLEX TO MG FOR LOW K
ALT: 18 U/L (ref 12–78)
AST: 18 U/L (ref 10–37)
Albumin: 3.8 g/dL (ref 3.4–5.0)
Alk Phosphatase: 81 U/L (ref 43–117)
BUN: 16 mg/dL (ref 7–22)
CO2: 27 mmol/L (ref 21–32)
Calcium: 9.5 mg/dL (ref 8.5–10.1)
Chloride: 103 mmol/L (ref 98–108)
Creatinine: 0.81 mg/dL (ref 0.55–1.10)
Est, Glom Filt Rate: 78 ml/min/1.73m2 (ref >60)
Glucose: 127 mg/dL — ABNORMAL HIGH (ref 74–106)
Potassium: 4.3 mmol/L (ref 3.4–5.1)
Sodium: 133 mmol/L — ABNORMAL LOW (ref 136–145)
Total Bilirubin: 0.2 mg/dL (ref 0.00–1.00)
Total Protein: 7.2 g/dL (ref 6.4–8.2)

## 2023-11-07 LAB — CBC WITH AUTO DIFFERENTIAL
Basophils %: 0.8 % (ref 0–2)
Basophils Absolute: 0.06 K/UL (ref 0.0–0.1)
Eosinophils %: 2.1 % (ref 0–5)
Eosinophils Absolute: 0.15 K/UL (ref 0.0–0.4)
Hematocrit: 31.7 % — ABNORMAL LOW (ref 37.0–47.0)
Hemoglobin: 10.2 g/dL — ABNORMAL LOW (ref 12.0–16.0)
Immature Granulocytes %: 0.4 % (ref 0.0–0.6)
Immature Granulocytes Absolute: 0.03 K/UL (ref 0.00–0.04)
Lymphocytes Absolute: 0.99 K/UL — ABNORMAL LOW (ref 1.2–3.7)
Lymphocytes: 13.6 % — ABNORMAL LOW (ref 14–46)
MCH: 32 pg — ABNORMAL HIGH (ref 27.0–31.0)
MCHC: 32.2 g/dL — ABNORMAL LOW (ref 33.0–37.0)
MCV: 99.4 FL — ABNORMAL HIGH (ref 80.0–94.0)
MPV: 9.3 FL (ref 7.4–10.4)
Monocytes %: 7.6 % (ref 5–12)
Monocytes Absolute: 0.55 K/UL (ref 0.2–1.0)
Neutrophils Absolute: 5.49 K/UL (ref 1.6–6.1)
Nucleated RBCs: 0 /100{WBCs}
Platelets: 346 K/uL (ref 130–400)
RBC: 3.19 M/uL — ABNORMAL LOW (ref 4.20–5.40)
RDW: 13.2 % (ref 11.5–14.5)
Seg Neutrophils: 75.5 % (ref 47–80)
WBC: 7.3 K/uL (ref 4.5–10.9)
nRBC: 0 K/uL

## 2023-11-07 LAB — LIPID PANEL
Chol/HDL Ratio: 3.5
Cholesterol, Total: 208 mg/dL — ABNORMAL HIGH (ref 0–199)
HDL: 59 mg/dL (ref >50)
LDL Cholesterol: 131.4 mg/dL
Non-HDL Cholesterol: 149 mg/dL
Triglycerides: 88 mg/dL (ref <150)

## 2023-11-07 LAB — HEMOGLOBIN A1C
Estimated Avg Glucose: 85 mg/dL
Hemoglobin A1C: 4.6 % (ref <5.7)

## 2023-11-07 LAB — IRON PROFILE
Iron % Saturation: 92 % — ABNORMAL HIGH (ref 15–50)
Iron: 329 ug/dL — ABNORMAL HIGH (ref 50–170)
TIBC: 358 ug/dL (ref 250–450)

## 2023-11-13 ENCOUNTER — Telehealth

## 2023-11-13 NOTE — Telephone Encounter (Signed)
 Pt name and DOB verified.    Pt questioning what she should do as her iron  count is going up.    [phone] (home)

## 2023-11-16 NOTE — Telephone Encounter (Signed)
 Shelly Perkins has iron  deficiency anemia and is correct, her iron  is going up. She thought her count was too high but I explained her CBC results to her. Her iron  profile shows her saturation is too high. Can you advise on labs so we can call her back?

## 2023-11-17 ENCOUNTER — Inpatient Hospital Stay: Admit: 2023-11-17 | Payer: Medicare (Managed Care)

## 2023-11-17 DIAGNOSIS — Z1231 Encounter for screening mammogram for malignant neoplasm of breast: Principal | ICD-10-CM

## 2023-11-17 DIAGNOSIS — Z780 Asymptomatic menopausal state: Secondary | ICD-10-CM

## 2023-11-18 ENCOUNTER — Encounter

## 2023-11-18 NOTE — Telephone Encounter (Signed)
 Patient's name and date of birth verified at start of call.   Incoming refill request.  Caller has been notified that we require a minimum of 2 business days for refill processing. (This does not include weekends, holidays, etc.)      Shelly Perkins  Sep 27, 1950      Confirmed best contact number:   Home Phone [phone] (home)    Medications Requested:  Requested Prescriptions     Pending Prescriptions Disp Refills    LORazepam  (ATIVAN ) 0.5 MG tablet 30 tablet 0       Preferred Pharmacy:   [facility] [phone] - [place], [place] - [address] - P [phone] [phone]  [address]  [place] [address]  Phone: [phone] Fax: [phone]    Has patient already contacted pharmacy to confirm no refills were on file: Yes    Notes for office regarding medication request:          Other instructions and notes:  Last visit with provider: 11/04/2023  Next scheduled visit with provider: 11/03/2024  *If next visit is not scheduled, book next needed visit or document if recall was added to list prior to sending for processing*  Inform patient that this needs to be on file before sending request as we do require for them to remain up-to-date with their recommended healthcare in order to avoid any interruptions in our ability to provide ongoing care such as refills.     Patient MyChart Status:  For Active Patients - Patient has been notified that they will receive an automated notification via MyChart once their script has been processed.   For Inactive Patients - Patient is aware that we have a patient portal, MyChart, which offers many benefits such as being able to request their refills electronically and receive automated notifications when scripts are processed.  In addition, they can schedule and manage appointments, view their testing results and visit notes, and stay connected with their care team.  Patient offered MyChart today.  Patient Active.  (Send link for set up if accepted)

## 2023-11-18 NOTE — Other (Signed)
 Please notify patient that her DEXA report shows osteopenia in the hip and normal bone density in the spine. This is an improvement from osteoporosis reported back in 2020. I would recommend that she continues to take Vitamin D  supplement with good calcium  intake

## 2023-11-19 MED ORDER — LORAZEPAM 0.5 MG PO TABS
0.5 | ORAL_TABLET | Freq: Every evening | ORAL | 0 refills | 30.00000 days | Status: DC
Start: 2023-11-19 — End: 2024-01-07

## 2023-11-19 NOTE — Telephone Encounter (Signed)
 CONTROLLED SUBSTANCE REFILL REQUEST    CLINICAL STAFF ACTION: PMP reviewed and appropriate.   UDS up to date and CSA up to date.  RX pended for signature      Requested Prescriptions     Pending Prescriptions Disp Refills    LORazepam  (ATIVAN ) 0.5 MG tablet 30 tablet 0       MEDICATION #1  Next due date:  09/27/2023  Last prescription refill date: 08/27/2023  Number of pills given with last prescription: 30.0  Maximum pills per day: 1    Last office visit: 11/04/2023     Future Appointments   Date Time Provider Department Center   11/23/2023  2:15 PM [facility] XR 1 [facility]   11/23/2023  2:30 PM Terrilyn Pama GAILS, MD ORTHO [facility]   11/03/2024  2:00 PM Delene Jerelene CROME, MD [facility]        Controlled Substance Flowsheet (last value only)      11/19/2023     7:47 AM 09/16/2023     1:50 PM 08/25/2023     2:45 PM 06/29/2023     3:32 PM 03/27/2023     7:11 AM 02/09/2023     9:49 AM 12/17/2022     9:44 AM   [facility] Controlled Substance Flowsheet   Medication contract reviewed and signed 09/16/2023 09/16/2023 09/16/2021 09/16/2021 09/16/2021 09/16/2021 09/16/2021   PMP Reviewed 11/19/2023 09/16/2023 08/25/2023 06/29/2023 03/27/2023 12/17/2022 12/17/2022   UDS 09/16/2023                 Documented by: Hudson CROME Ar, MA

## 2023-11-23 ENCOUNTER — Inpatient Hospital Stay: Admit: 2023-11-23 | Discharge: 2023-11-23 | Payer: Medicare (Managed Care)

## 2023-11-23 ENCOUNTER — Ambulatory Visit: Admit: 2023-11-23 | Discharge: 2023-11-23 | Payer: Medicare (Managed Care) | Attending: Orthopaedic Surgery

## 2023-11-23 DIAGNOSIS — M25561 Pain in right knee: Principal | ICD-10-CM

## 2023-11-23 DIAGNOSIS — M170 Bilateral primary osteoarthritis of knee: Principal | ICD-10-CM

## 2023-11-23 MED ORDER — METHYLPREDNISOLONE ACETATE 80 MG/ML IJ SUSP
80 | Freq: Once | INTRAMUSCULAR | Status: AC
Start: 2023-11-23 — End: 2023-11-23
  Administered 2023-11-23: 19:00:00 80 mg via INTRA_ARTICULAR

## 2023-11-23 MED ORDER — LIDOCAINE HCL 1 % IJ SOLN
1 | Freq: Once | INTRAMUSCULAR | Status: AC
Start: 2023-11-23 — End: 2023-11-23
  Administered 2023-11-23: 19:00:00 4 mL via INTRA_ARTICULAR

## 2023-11-23 NOTE — Other (Signed)
 Please notify patient that her mammogram is normal

## 2023-11-23 NOTE — Progress Notes (Signed)
 Procedure Time Out      Pre-procedure checklist:      [x]   Patient identity confirmed using (2) patient identifiers  [x]   Documenation in chart agrees with intended procedure  [x]   Consent is signed and agrees with chart and patient  [x]   Relevant images and test results are labeled and correctly displayed      Confirmation that patient has taken prescribed pre-procedure medications:    [x]   No premedications ordered    Timeout completed by the provider and assistant immediatily prior to the procedure:    [x]   Patient identity confirmed using (2) patient identifiers  [x]   Documentation in chart agrees with intended proceudre  [x]   Consent is signed and agrees with chart and patient   [x]   Correct site and side has been marked by provider  [x]   Relevant images and test results are labeled and correctly displayed  [x]   Any safety practices needed based on patient history       80 mg depo medrol  lot #[id number] EXP 12/25/2024, NDC 0009-3475-01,MFR : [employer] ,each knee  1% lidocaine  lot# [id number] EXP 04/25/2027,NDC 36676-514-72, MFR: [facility] ,each knee

## 2023-11-23 NOTE — Progress Notes (Signed)
 CC: bilateral knee pain    HPI:   The patient is a pleasant 73 y.o. year-old female who presents today for evaluation of bilateral knee pain, right worse.  The knees have been painful for years.   The pain involves the whole joint. The pain radiates down the lateral lower leg.  The patient describes the pain as burning and numb in nature. The pain is constant, and occasionally there are severe exacerbations of the pain. The pain is exacerbated by prolonged walking and prolonged activity. From a functional standpoint the patient has difficulty with activities of daily living such as stairs and walking for longer distances. The patient is fearful of falls because of instability. The patient's ambulatory capacity is affected by pain, but the patient endures the pain without stopping. The pain has become severe enough to interfere with both activities of daily living and general quality of life. The patient has tried conservative measures including Tylenol  and topicals (blue emu, aspercreme).  Despite the use of these treatments for greater than 3 months, the pain persists.      Past Medical History:   Diagnosis Date    Atrial fibrillation (HCC)     GERD (gastroesophageal reflux disease)     Hypertension        Current Outpatient Medications:     LORazepam  (ATIVAN ) 0.5 MG tablet, Take 1 tablet by mouth nightly for 30 days. Max Daily Amount: 0.5 mg, Disp: 30 tablet, Rfl: 0    Ferrous Sulfate ER 50 MG TBCR, Take 1 tablet by mouth daily, Disp: , Rfl:     ferrous sulfate dried (SLOW RELEASE IRON ) 160 (50 Fe) MG TBCR extended release tablet, Take 160 mg by mouth 2 times daily, Disp: 30 tablet, Rfl: 5    ELIQUIS  5 MG TABS tablet, TAKE 1 TABLET TWICE DAILY, Disp: 180 tablet, Rfl: 3    metoprolol  tartrate (LOPRESSOR ) 25 MG tablet, TAKE 1 TABLET TWICE DAILY, Disp: 180 tablet, Rfl: 3    irbesartan  (AVAPRO ) 150 MG tablet, TAKE 1 TABLET EVERY NIGHT, Disp: 90 tablet, Rfl: 3    pantoprazole  (PROTONIX ) 40 MG tablet, Take 1 tablet by  mouth in the morning and at bedtime, Disp: , Rfl:   No Known Allergies  Past Surgical History:   Procedure Laterality Date    APPENDECTOMY      CESAREAN SECTION      NECK SURGERY      fusion     Family History   Problem Relation Age of Onset    Cancer Father      Social History     Socioeconomic History    Marital status: Married     Spouse name: Not on file    Number of children: Not on file    Years of education: Not on file    Highest education level: Not on file   Occupational History    Not on file   Tobacco Use    Smoking status: Every Day     Current packs/day: 0.25     Average packs/day: 0.3 packs/day for 51.7 years (12.9 ttl pk-yrs)     Types: Cigarettes     Start date: 1974    Smokeless tobacco: Never    Tobacco comments:     Two packs per week 07/25/22    Substance and Sexual Activity    Alcohol use: Never    Drug use: Never    Sexual activity: Not Currently   Other Topics Concern    Not on  file   Social History Narrative    Not on file     Social Drivers of Health     Financial Resource Strain: Low Risk  (09/16/2021)    Overall Financial Resource Strain (CARDIA)     Difficulty of Paying Living Expenses: Not hard at all   Food Insecurity: No Food Insecurity (06/26/2023)    Hunger Vital Sign     Worried About Running Out of Food in the Last Year: Never true     Ran Out of Food in the Last Year: Never true   Transportation Needs: No Transportation Needs (06/26/2023)    PRAPARE - Therapist, art (Medical): No     Lack of Transportation (Non-Medical): No   Physical Activity: Sufficiently Active (11/04/2023)    Exercise Vital Sign     Days of Exercise per Week: 7 days     Minutes of Exercise per Session: 80 min   Stress: Not on file   Social Connections: Not on file   Intimate Partner Violence: Not on file   Housing Stability: Low Risk  (06/26/2023)    Housing Stability Vital Sign     Unable to Pay for Housing in the Last Year: No     Number of Times Moved in the Last Year: 0     Homeless in  the Last Year: No         ROS: Musculoskeletal and general systems were reviewed with the patient and are negative except as above.      Examination: There were no vitals taken for this visit.  The patient is awake, alert and appropriate.  The head is normocephalic.  The sclerae are white.  The skin has normal turgor.  The abdomen is non-protruded.  The chest expands normally with deep inspiration.       Focused examination of the right knee reveals intact skin anteriorly.  There is a mild knee effusion. There is neutral alignment of the knee in full extension.  The extensor mechanism is intact to both palpation and the patient is able to perform a straight leg raise. There is tenderness with palpation about the medial aspect of the knee.  Active range of motion is from 0 - 130.   There is no extensor lag.  The patient reports no pain throughout the arc of motion.  The knee is stable in the coronal plane through out the available arc of motion.  Sagittal plane stability in midflexion is within normal limits. The calf is soft and supple.  Pedal pulses are palpable.    Focused examination of the left knee reveals intact skin anteriorly. There is a mild knee effusion. There is neutral alignment of the knee in full extension.  The extensor mechanism is intact to both palpation and the patient is able to perform a straight leg raise. There is tenderness with palpation about the medial aspect of the knee.  Active range of motion is from 0 - 130.   There is no extensor lag.  The patient reports no pain throughout the arc of motion.  The knee is stable in the coronal plane through out the available arc of motion.  Sagittal plane stability in midflexion is within normal limits. The calf is soft and supple.  Pedal pulses are palpable.      Imaging:   Xrays obtained today at [facility] were independently reviewed and demonstrate: There is an upright AP view of both knees, bilateral Cecillia, as well  as a lateral and sunrise  view of the bilateralknees. There is mild stage osteoarthritis of right knee, mild stage osteoarthritis of left. There is varus alignment of the right knee on the AP view, varus alignment of the left. There is mild medial narrowing of the tibiofemoral joint space of the right knee, as well as left. There are no definite osteophytes present at the bilateral knee joint lines.  The lateral views reveal no definite patellofemoral osteophytes bilaterally. The sunrise view of reveals preservation of the bilateral patellofemoral joint space. KL I bilaterally      Assessment: 74 y.o. female with early bilateral knee osteoarthritis and likely some component of pain being referred by her spine    Plan:     We have discussed the natural history of knee joint arthrosis, as well as various treatment options. Non-invasive options for managing degenerative joint disease include activity modification, physical therapy/HEP, supplements, and/or medical management with acetaminophen  and/or NSAIDs. More invasive treatment options include intraarticular injection(s), radiofrequency nerve ablation, and potentially knee arthroplasty. Treatment is not necessary in the absence of lifestyle limiting pain, and my recommendation is for maximizing conservative care prior to surgical intervention. Despite treatment with conservative measures, the patient reports ongoing symptoms that interfere with quality of life and desired activities.  It sounds like some of her pain is neuropathic in nature.  At this point I think we have either options of proceeding with advanced imaging to assess for intra-articular pain generators, or diagnostic injections to see how much pain is alleviated; I suspect whatever residual pain remains is likely being referred by her lumbar spine.  She would like to proceed with bilateral diagnostic injections today.  If effective, they could be repeated as frequently as every 3 months.  If she has no substantial relief from  these, then I would favor treatment of her lumbar spine pathology to see what effect this has on her knee pain.    After informed consent was obtained and written consent signed, under sterile conditions, 80 mg of Depo-Medroland 4  mL of 1% Xylocaine  was injected into the patient's right knee intra-articularly.  This procedure was then repeated in the left knee.  The patient tolerated the procedures quite well. Typical post-injection instructions were given.    Patient was evaluated with review of x-rays, counseling and ultimately injection so therefore I will be adding modifier 25 to the visit.       The patient understands the plan and will follow up accordingly.    This chart was dictated using M Modal, a voice to text software; inherent to using this technology, there may be incorrectly spelled words, or erroneous phrases. Please call the office with any questions or concerns.    A copy of this note will be sent to the referring provider.

## 2023-11-24 NOTE — Telephone Encounter (Signed)
 Shelly Jerelene CROME, MD     11/16/23  5:16 PM  Her result is very different form last month. I would repeat her iron  profile + ferritin before any conclusion.      Labs added as noted, Call placed to patient to review concerns noted.  Left message at contact number listed requesting return call.    11/24/2023 2:42 PM

## 2023-11-30 NOTE — Result Encounter Note (Signed)
"  T was seen in the office on 11/04/23,labs were reviewed.   "

## 2023-12-02 NOTE — Telephone Encounter (Signed)
"  Pt states she saw her GI provider and they recommended for her to follow up anemia, iron  issues with Hematology.  Pt requesting referral.   12/02/2023 9:53 AM      "

## 2023-12-02 NOTE — Result Encounter Note (Signed)
"  Pt advised of results and recommendations.     12/02/2023 9:51 AM      "

## 2023-12-02 NOTE — Telephone Encounter (Signed)
"  Notify pt hematol referral made and sign note  "

## 2023-12-02 NOTE — Addendum Note (Signed)
"  Addended by: Aileena Iglesia KATHERINE on: 12/02/2023 05:07 PM     Modules accepted: Orders    "

## 2023-12-25 NOTE — Telephone Encounter (Addendum)
"  Records with [facility]    Sent Welcome letter new pt packet     Patients name and date of birth verified      Diagnosis:M25.561, M25.562 (ICD-10-CM) - Pain in both knees, unspecified chronicity     Is this going through the [place]?     NO       If YES, Do we have up to date referral for the correct body part?          Is this referral Workers [employer] Accident Related:      no       If YES, What is the date of your injury, claim#, insurance company and adjustor:         Have you seen a pain management provider in the past:            If YES, What was the name of the provider, office name and when:      yes     Have you had any imaging done w/in the past 2 years?      [facility]       If YES, what facility was the imaging completed:      11/23/23    Have you had any injections done in the past?            If YES, what office/provider:   When:   What type of injection:       [facility]     Have you ever seen a chiropractor for this issue?      no      If YES, what office/provider:         When:          Have you ever had physical therapy for this issue:   [facility]        If YES, what office/provider:         When:           Have you ever seen orthopedics, neurology or neurosurgery for this issue?[facility]      If YES, what office/provider:         When:      Please note our providers do not prescribe any opioid medications at this practice. Do you have any questions regarding this:            As we prepare for you upcoming visit, our office will need your previous medical records including imaging reports and CD's prior to your scheduled visit on        Did scheduler request records?         Please note our office is a scent neutral zone, please refrain from wearing perfume, cologne, and/or other fragrances.   "

## 2024-01-07 ENCOUNTER — Encounter

## 2024-01-07 NOTE — Telephone Encounter (Signed)
"  Patient's name and date of birth verified at start of call.   Incoming refill request.  Caller has been notified that we require a minimum of 2 business days for refill processing. (This does not include weekends, holidays, etc.)      Shelly Perkins  07/27/1950      Confirmed best contact number:   Home Phone [phone] (home)    Medications Requested:  Requested Prescriptions     Pending Prescriptions Disp Refills    LORazepam  (ATIVAN ) 0.5 MG tablet 30 tablet 0     Sig: Take 1 tablet by mouth nightly for 30 days. Max Daily Amount: 0.5 mg       Preferred Pharmacy:   [id number] [place], [place] - [phone] [place] [phone]  [address]  [place] [address]  Phone: [phone] Fax: [phone]    Has patient already contacted pharmacy to confirm no refills were on file: No    Notes for office regarding medication request:          Other instructions and notes:  Last visit with provider: 11/04/2023  Next scheduled visit with provider: 11/03/2024  *If next visit is not scheduled, book next needed visit or document if recall was added to list prior to sending for processing*  Inform patient that this needs to be on file before sending request as we do require for them to remain up-to-date with their recommended healthcare in order to avoid any interruptions in our ability to provide ongoing care such as refills.     Patient MyChart Status:  For Active Patients - Patient has been notified that they will receive an automated notification via MyChart once their script has been processed.   For Inactive Patients - Patient is aware that we have a patient portal, MyChart, which offers many benefits such as being able to request their refills electronically and receive automated notifications when scripts are processed.  In addition, they can schedule and manage appointments, view their testing results and visit notes, and stay connected with their care team.  Patient offered MyChart today.  Patient  declined.  (Send link for set up if accepted)    "

## 2024-01-08 MED ORDER — LORAZEPAM 0.5 MG PO TABS
0.5 | ORAL_TABLET | Freq: Every evening | ORAL | 0 refills | 15.00000 days | Status: DC
Start: 2024-01-08 — End: 2024-02-17

## 2024-01-08 NOTE — Telephone Encounter (Signed)
"      CONTROLLED SUBSTANCE REFILL REQUEST    CLINICAL STAFF ACTION:   PMP was reviewed and appropriate:  yes  CSA and UDS are up to date:  yes  Rx was pended for provider to sign:  yes    Requested Prescriptions     Pending Prescriptions Disp Refills    LORazepam  (ATIVAN ) 0.5 MG tablet 30 tablet 0     Sig: Take 1 tablet by mouth nightly for 30 days. Max Daily Amount: 0.5 mg         MEDICATION #1  Next due date:  12/24/2023  Last prescription refill date: 11/24/2023  Number of pills given with last prescription: 30  Maximum pills per day: 1    Last office visit: 11/04/2023     Future Appointments   Date Time Provider Department Center   01/15/2024  2:30 PM Shona Neil LABOR, APRN - NP [facility]   11/03/2024  2:00 PM Delene Jerelene CROME, MD [facility]      FILLED  WRITTEN SOLD    11/19/2023 11/19/2023 11/24/2023 1   Lorazepam  0.5 Mg Tablet  30.00 30 Be Eli [id number] [facility] 0 0.50 [facility] [place]    08/25/2023 08/25/2023 08/27/2023 1   Lorazepam  0.5 Mg Tablet  30.00 30 Ap Dam [id number] [facility] (0990) 0 0.50 [place]    06/30/2023 06/29/2023 06/30/2023 1   Lorazepam  0.5 Mg Tablet  30.00 30 Su Bax [id number] [facility] (0990) 0 0.50 [place]    05/12/2023 05/11/2023 05/12/2023 1   Lorazepam  0.5 Mg Tablet  30.00 30 Su Bax [id number] [facility] (0990) 0 0.50 [place]  Controlled Substance Monitoring Flowsheet  [place] Controlled Substance Flowsheet Medication contract reviewed and signed PMP Reviewed UDS   01/08/2024 09/16/2023 01/08/2024 09/16/2023   11/19/2023 09/16/2023 11/19/2023 09/16/2023   09/16/2023 09/16/2023 09/16/2023    08/25/2023 09/16/2021 08/25/2023    06/29/2023 09/16/2021 06/29/2023    03/27/2023 09/16/2021 03/27/2023    02/09/2023 09/16/2021 12/17/2022    12/17/2022 09/16/2021 12/17/2022           Documented by: Karna JINNY Kief, MA    "

## 2024-01-15 ENCOUNTER — Inpatient Hospital Stay: Admit: 2024-01-15 | Payer: Medicare (Managed Care)

## 2024-01-15 ENCOUNTER — Ambulatory Visit: Admit: 2024-01-15 | Discharge: 2024-01-15 | Payer: Medicare (Managed Care)

## 2024-01-15 LAB — IRON PROFILE
Iron % Saturation: 16 % (ref 15–50)
Iron: 70 ug/dL (ref 50–170)
TIBC: 431 ug/dL (ref 250–450)

## 2024-01-15 LAB — FERRITIN: Ferritin: 45.6 ng/mL (ref 8–252)

## 2024-01-15 NOTE — H&P (View-Only) (Signed)
 "[facility] Initial Consultation Note    Date of Service: 01/15/24  Patient Name: Shelly Perkins  Patient DOB: 1950-04-03  PCP: Delene Jerelene CROME, MD    Chief Complaint   Patient presents with    New Patient     Pain in both knees, unspecified chronicity referred by Jerelene Delene, MD. Pt states she saw ortho for her knees and she is here to be seen for her lower left back. She has been seen for this issue back in 2024. Pt has seen PT, had injections and her pain has not improved.   Pt is taking tylenol  arthritis -2 at bedtime, every night. She is on blood thinners. 7/10        HPI:    History of Present Illness  The patient is a 73 year old female who presents for a follow-up evaluation of lumbar radicular pain.    She was last seen on 12/05/2022 and underwent a right L4-5 and L5-S1 transforaminal epidural steroid injection on 01/08/2023. Since then, she reports a worsening of her condition, with pain now radiating down both arms and into her thumbs. This new symptom, characterized by sharp pain extending into her fingers, has been present for several months. Additionally, she continues to experience persistent back pain, which she believes has worsened despite undergoing two or three injections without relief. Pain radiates down her right leg and into her feet, causing discomfort when wearing socks. She reports no neck pain but does experience shoulder pain. She maintains flexibility and can walk on her toes, although with some difficulty due to toe pain. She reports no issues with bladder or bowel incontinence or urgency. While she has not experienced any falls, she does report occasional numbness in her fingers.     The primary source of her pain is her back. She experiences constant pain that worsens with prolonged sitting or standing, making it difficult to lie down and stretch out at night. She is considering the use of a lumbar pillow for support while sitting. She uses a spandex  knee brace when anticipating extensive walking or stair climbing, which she finds beneficial. She reports no allergies to contrast dye or iodine and is not diabetic. Her current pain management regimen includes two Arthritis Strength Tylenol  tablets taken nightly before bed. She previously used Excedrin but discontinued it due to the initiation of blood thinners. She has not tried gabapentin or Lyrica.    She has a history of carpal tunnel syndrome, which was surgically corrected. 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 She has undergone neck surgery, during which a metal rod was inserted and bone chips from her left hip were fused into her neck. She recently had a DEXA scan, which showed improvement compared to the previous one.    PAST SURGICAL HISTORY:  She has undergone neck surgery, during which a metal rod was inserted and bone chips from her left hip were fused into her neck.     SOCIAL HISTORY  She does not drink alcohol.     Injections -   01/08/23 R L4-5, L5-S1 TESI  0% relief    Surgery - none    The patient denies new numbness/tingling and weakness. The patient denies bladder/bowel dysfunction, urgency, and incontinence. The patient denies unexplained weight loss, fevers/chills, and night sweats. The patient denies new balance problems.    ROS:  See history of present illness and scanned intake form from today for review of systems  and family history. 12 systems and family history were reviewed and are negative/unchanged unless otherwise noted in HPI or on intake form.    IMAGING:  EXAM: MRI LUMBAR SPINE WO CONTRAST     INDICATION: 73 year old female with chronic right-sided low back pain with right-sided sciatica; Chronic right-sided low back pain with right-sided sciatica; Pt states ongoing LBP with right leg radiculopathy.      COMPARISON: None.     TECHNIQUE: Noncontrast sagittal T1, T2 and STIR sequences, as well as coronal T2 sequence. Axial T1 and T2 weighted  sequences through the lumbar spine were obtained.      FINDINGS: Overall image quality is degraded by technical artifact and some degree of patient motion artifact.     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 Based on parasagittal imaging, there is no malalignment. Degeneration of all lumbar disks with variable loss disc space height. The conus occupies a normal location and is normal in appearance. There is no intraspinal mass. Prominent degenerative endplate changes are present at L2-3 without edema. There are subtle edematous endplate changes L3-4, L4-5 and L5-S1 along with L1-2.     At the T11-12 and T12-L1 levels, there is bilateral posterior facet hypertrophy present, minimal diffuse disc bulge. There is no canal stenosis at either level. Minimal foraminal compromise bilaterally.     At L1-2, there is mild diffuse disc bulge with osteophyte associated with mild deformity of the ventral thecal sac. Mild bilateral posterior hypertrophy ligamentous thickening. No canal stenosis. There is mild foraminal encroachment left greater than right this level.     At L2-3, diffuse disc bulge with osteophyte formation is present, subtly eccentric left, associated with deformity of the ventral thecal sac. There is mild bilateral posterior hypertrophy left greater than right with mild ligamentous thickening. No canal stenosis, however, there is moderately severe foraminal encroachment on the left at this level with milder foraminal encroachment on the right.     At L3-4, diffuse disc bulge with osteophyte formation is present associated with mild deformity of the ventral thecal sac. Mild posterior facet hypertrophy. There is no canal stenosis. Moderate foraminal encroachment on the left with milder foraminal encroachment on the right.     At L4-5, there is diffuse disc bulge with osteophyte associated with mild deformity of the ventral thecal sac. There is mild to  moderate posterior hypertrophy with ligamentous thickening. Mild central canal narrowing at this level without frank stenosis. There is moderately severe foraminal encroachment on the proximal right neural foramen at this level with moderate foraminal encroachment on the left.     At L5-S1, there is diffuse disc bulge with osteophyte associated with mild deformity of the ventral thecal sac. Moderate posterior facet hypertrophy with ligamentous thickening is present. There is no canal stenosis, however, degenerative factors combine to result in severe encroachment on the proximal right neural foramen at this level with moderately severe foraminal encroachment on the left.     IMPRESSION: Degenerative changes are present throughout the lumbar region, as detailed above. There is no canal stenosis at any level, however, there is marked multilevel foraminal encroachment present, please see details above.  PMH/PSxHx:  Past Medical History:   Diagnosis Date    Atrial fibrillation (HCC)     GERD (gastroesophageal reflux disease)     Hypertension      Past Surgical History:   Procedure Laterality Date    APPENDECTOMY      BREAST BIOPSY  CESAREAN SECTION      NECK SURGERY      fusion       FAMILY HISTORY:  Family History   Problem Relation Age of Onset    Cancer Father        SOCIAL HISTORY:  Social History     Social History Narrative    Not on file       MEDICATIONS:    Current Outpatient Medications:     LORazepam  (ATIVAN ) 0.5 MG tablet, Take 1 tablet by mouth nightly for 30 days. Max Daily Amount: 0.5 mg, Disp: 30 tablet, Rfl: 0    Ferrous Sulfate ER 50 MG TBCR, Take 1 tablet by mouth daily, Disp: , Rfl:     ELIQUIS  5 MG TABS tablet, TAKE 1 TABLET TWICE DAILY, Disp: 180 tablet, Rfl: 3    metoprolol  tartrate (LOPRESSOR ) 25 MG tablet, TAKE 1 TABLET TWICE DAILY, Disp: 180 tablet, Rfl: 3    irbesartan  (AVAPRO ) 150 MG tablet, TAKE 1 TABLET EVERY NIGHT, Disp: 90 tablet, Rfl: 3    pantoprazole  (PROTONIX ) 40 MG tablet, Take 1  tablet by mouth in the morning and at bedtime (Patient taking differently: Take 1 tablet by mouth daily), Disp: , Rfl:     ferrous sulfate dried (SLOW RELEASE IRON ) 160 (50 Fe) MG TBCR extended release tablet, Take 160 mg by mouth 2 times daily (Patient not taking: Reported on 01/15/2024), Disp: 30 tablet, Rfl: 5    ALLERGIES:  No Known Allergies    Physical Exam    General/Constitutional: Patient is seated comfortably in NAD, well-groomed.   Head/Eyes: Head atraumatic, EOMI   ENT: Hearing grossly normal   Respiratory: Breathing comfortably on RA, normal respiratory effort, no audible wheeze   Cardiovascular: 2+ peripheral radial and DP pulses, no LE edema noted   GI: abdomen non-distended, non-tender to palpation   GU: deferred   Skin: No appreciable rashes or skin breakdown   Hematologic/Lymphatic: no cervical or axillary lymphadenopathy noted   Psychiatric: Appropriate affect, A&Ox3 , answers questions appropriately, short and long term memory grossly normal, normal insight   Lumbar exam:    Musculoskeletal:    Inspection: Normal lumbar coronal and sagittal contours.    Stations:  Independent transfers from sitting to standing.    Gait:  Within normal limits.  Heel-toe and tandem walking intact.    Palpation: + tenderness over bilateral lumbar paravertebral musculature.    Range of motion: Lumbar spine range of motion is reduced by 50% with pain on extension and rotation    Special tests:  Negative sacroiliac joint tests (distraction, compression, thigh thrust, Faber's, Patrick's, Production Manager, Gaenslen's)    Neurological:    Motor: 5/5 throughout right and left lower extremities.  Normal tone without atrophy or abnormal movements.      Sensory: Grossly intact to light touch and pinprick throughout the right and left lower extremities.    DTRs:  2+ and symmetric at right and left patellar and Achilles.    Special tests: Negative right and left straight leg raise.  Right and left plantar responses are flexor  nature.  No clonus about the ankles.    Assessment & Plan      1. Lumbar radiculopathy  Assessment & Plan:   Katelynn presents for follow up evaluation.  She last underwent a right L4-5 and L5-S1 transforaminal epidural steroid injection on 01/08/23.  Unfortunately, this has not provided her with any relief for pain.  Since that time, her pain has persisted.  She continues to have significant right-sided low back pain with radiation down her right leg.  The pain is made worse with any prolonged activity.  She denies any numbness or tingling.    Unfortunately, she has failed extensive conservative treatment.  I would not recommend any further injections, as these have not been helpful for her.  We discussed medication options and surgical referral.  Patient would prefer to avoid this for now, we will further discuss this pending her response to the lumbar medial branch blocks.  2. Lumbar spondylosis  Assessment & Plan:   The patients' major concern is chronic right-sided low back pain.  We reviewed her previous lumbar MRI, which shows significant degenerative endplate changes at L2-3, severe disc degeneration at each level of the lumbar spine.  There is severe foraminal narrowing from L2-3 through L5-S1.  Patient has previously undergone epidural steroid injections, as well as her home exercise program without any relief of her pain.  Thus we discussed targeting the facet joints since this may be a contributing factor.  Based on her MRI findings and exam, I have recommended right L2-3 and L3-4 medial branch blocks with an eye towards rhizotomy, and patient would like to proceed.    Risks and benefits or MBB were reviewed and discussed. They include: infection, pain flare or no anesthetic response     Patient understands the goal of this procedure is to see if they have a 80% decrease in pain in the first 8 hours after the injections. They agree to complete their homework (documenting on pain paperwork every hour for 8  hours or until they go to bed).      They will contact the office the following day with the data. If pain rates are reduced by 80+% a rhizotomy will be scheduled.     Consent will be signed prior to the procedure     Discussed risks and benefits of rhizotomy which include: infection, post-procedure pain flare, swelling. It may take 1 day to 3-4 weeks for optimum benefit to occur. They understand the consent will be reviewed and signed at pre-procedure evaluation.     Patient denies allergy to contrast dye or iodine, diabetes, use of aspirin or blood thinners, pacemaker, SCS, or internal devices    Orders:  -     Amb Referral to Outpatient Department         LEVEL OF SERVICE BASED ON TIME:  I personally spent a total of 40 minutes on today's visit doing chart preparation, review of previous records/labs/imaging, performing a medically appropriate evaluation, counseling and educating the patient, and documenting clinical information in the electronic health record.    A copy of this consultation note will be sent to the requesting provider.    The patient (or guardian, if applicable) and other individuals in attendance with the patient were advised that Artificial Intelligence will be utilized during this visit to record, process the conversation to generate a clinical note, and support improvement of the AI technology. The patient (or guardian, if applicable) and other individuals in attendance at the appointment consented to the use of AI, including the recording.      CC:  Elisha, Belinda L, MD  [address]  [place],  [place] [address]   "

## 2024-01-15 NOTE — Assessment & Plan Note (Signed)
"   Shelly Perkins presents for follow up evaluation.  She last underwent a right L4-5 and L5-S1 transforaminal epidural steroid injection on 01/08/23.  Unfortunately, this has not provided her with any relief for pain.  Since that time, her pain has persisted.  She continues to have significant right-sided low back pain with radiation down her right leg.  The pain is made worse with any prolonged activity.  She denies any numbness or tingling.    Unfortunately, she has failed extensive conservative treatment.  I would not recommend any further injections, as these have not been helpful for her.  We discussed medication options and surgical referral.  Patient would prefer to avoid this for now, we will further discuss this pending her response to the lumbar medial branch blocks.  "

## 2024-01-15 NOTE — Assessment & Plan Note (Addendum)
"   The patients' major concern is chronic right-sided low back pain.  We reviewed her previous lumbar MRI, which shows significant degenerative endplate changes at L2-3, severe disc degeneration at each level of the lumbar spine.  There is severe foraminal narrowing from L2-3 through L5-S1.  Patient has previously undergone epidural steroid injections, as well as her home exercise program without any relief of her pain.  Thus we discussed targeting the facet joints since this may be a contributing factor.  Based on her MRI findings and exam, I have recommended right L2-3 and L3-4 medial branch blocks with an eye towards rhizotomy, and patient would like to proceed.    Risks and benefits or MBB were reviewed and discussed. They include: infection, pain flare or no anesthetic response     Patient understands the goal of this procedure is to see if they have a 80% decrease in pain in the first 8 hours after the injections. They agree to complete their homework (documenting on pain paperwork every hour for 8 hours or until they go to bed).      They will contact the office the following day with the data. If pain rates are reduced by 80+% a rhizotomy will be scheduled.     Consent will be signed prior to the procedure     Discussed risks and benefits of rhizotomy which include: infection, post-procedure pain flare, swelling. It may take 1 day to 3-4 weeks for optimum benefit to occur. They understand the consent will be reviewed and signed at pre-procedure evaluation.     Patient denies allergy to contrast dye or iodine, diabetes, use of aspirin or blood thinners, pacemaker, SCS, or internal devices    "

## 2024-01-15 NOTE — Patient Instructions (Signed)
"  You will be scheduled for : lumbar medial branch block     Please read the following instructions to help you prepare for your upcoming procedure:    Procedures are done in the Outpatient Department at [facility], located at [address] in [place].  Once in the building, you will go to the Outpatient department on the second floor.    To expedite the registration process and allow us  to maintain social distancing, the registrant may contact you the day prior to your injection to update your insurance information, obtain consent for treatment, and collect a co-pay if applicable. Please arrive at your scheduled time to allow for registration and initial assessment.  If you arrive later than your scheduled time, your procedure may have to be rescheduled.    You will receive a call from our office to schedule this injection.  Please expect to wait at least 14 business days for this phone call.  You will be given your arrival time either in the office or during that call.  PLEASE DISREGARD ANY OTHER NOTIFICATION OF A TIME OTHER THAN THE ARRIVAL TIME GIVEN TO YOU BY OUR OFFICE.    If you are on antibiotics, antivirals, or antifungals by mouth for an infection, we will not be able to do the procedure until you have completed the full course.  In this case, please call us  to reschedule the procedure.    If this is a test procedure (medial branch blocks or joint nerve blocks), do not take any as needed pain medications prior to the procedure.  You will need to have at least moderate (5-6/10) pain in order for the test procedure to be accurate.    If you are diabetic, your sugar will be checked before the procedure.  If it is over 300, the procedure will need to be rescheduled.    If you are on aspirin or a blood thinner, it may need to be stopped before your procedure.  Our clinical team will tell you how far in advance to stop your blood thinner, if needed.    If you are on Coumadin/Warfarin and it needs to be  stopped for the procedure, you will be arriving in the outpatient department 1 hour before your procedure for an INR check.    YOU ARE NOT REQUIRED TO HAVE A DRIVER FOR THIS PROCEDURE.    Dye Allergy:   Remember to inform us  if you have ever had an allergy to contrast dye or iodine.    You may need medication prior to your injection to prevent any reaction.   Medications used for confirmed contrast allergy:     Take prednisone 50 mg 13 hours, 7 hours, and 1 hour prior to your procedure  AND  Take benadryl  50 mg 1 hour prior to your procedure    Day of Procedure:   1. Please bring a current medication list with you. Please bring minimal belongings and limit jewelry.  2. A driver will be required if you need any medication for the procedure to help you relax.   3. You will be given a follow up appointment after procedure.     Please call the Physiatry office with any questions. [phone]      "

## 2024-01-15 NOTE — Progress Notes (Signed)
 "[facility] Initial Consultation Note    Date of Service: 01/15/24  Patient Name: Shelly Perkins  Patient DOB: 1950-04-03  PCP: Delene Jerelene CROME, MD    Chief Complaint   Patient presents with    New Patient     Pain in both knees, unspecified chronicity referred by Jerelene Delene, MD. Pt states she saw ortho for her knees and she is here to be seen for her lower left back. She has been seen for this issue back in 2024. Pt has seen PT, had injections and her pain has not improved.   Pt is taking tylenol  arthritis -2 at bedtime, every night. She is on blood thinners. 7/10        HPI:    History of Present Illness  The patient is a 73 year old female who presents for a follow-up evaluation of lumbar radicular pain.    She was last seen on 12/05/2022 and underwent a right L4-5 and L5-S1 transforaminal epidural steroid injection on 01/08/2023. Since then, she reports a worsening of her condition, with pain now radiating down both arms and into her thumbs. This new symptom, characterized by sharp pain extending into her fingers, has been present for several months. Additionally, she continues to experience persistent back pain, which she believes has worsened despite undergoing two or three injections without relief. Pain radiates down her right leg and into her feet, causing discomfort when wearing socks. She reports no neck pain but does experience shoulder pain. She maintains flexibility and can walk on her toes, although with some difficulty due to toe pain. She reports no issues with bladder or bowel incontinence or urgency. While she has not experienced any falls, she does report occasional numbness in her fingers.     The primary source of her pain is her back. She experiences constant pain that worsens with prolonged sitting or standing, making it difficult to lie down and stretch out at night. She is considering the use of a lumbar pillow for support while sitting. She uses a spandex  knee brace when anticipating extensive walking or stair climbing, which she finds beneficial. She reports no allergies to contrast dye or iodine and is not diabetic. Her current pain management regimen includes two Arthritis Strength Tylenol  tablets taken nightly before bed. She previously used Excedrin but discontinued it due to the initiation of blood thinners. She has not tried gabapentin or Lyrica.    She has a history of carpal tunnel syndrome, which was surgically corrected. 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 She has undergone neck surgery, during which a metal rod was inserted and bone chips from her left hip were fused into her neck. She recently had a DEXA scan, which showed improvement compared to the previous one.    PAST SURGICAL HISTORY:  She has undergone neck surgery, during which a metal rod was inserted and bone chips from her left hip were fused into her neck.     SOCIAL HISTORY  She does not drink alcohol.     Injections -   01/08/23 R L4-5, L5-S1 TESI  0% relief    Surgery - none    The patient denies new numbness/tingling and weakness. The patient denies bladder/bowel dysfunction, urgency, and incontinence. The patient denies unexplained weight loss, fevers/chills, and night sweats. The patient denies new balance problems.    ROS:  See history of present illness and scanned intake form from today for review of systems  and family history. 12 systems and family history were reviewed and are negative/unchanged unless otherwise noted in HPI or on intake form.    IMAGING:  EXAM: MRI LUMBAR SPINE WO CONTRAST     INDICATION: 73 year old female with chronic right-sided low back pain with right-sided sciatica; Chronic right-sided low back pain with right-sided sciatica; Pt states ongoing LBP with right leg radiculopathy.      COMPARISON: None.     TECHNIQUE: Noncontrast sagittal T1, T2 and STIR sequences, as well as coronal T2 sequence. Axial T1 and T2 weighted  sequences through the lumbar spine were obtained.      FINDINGS: Overall image quality is degraded by technical artifact and some degree of patient motion artifact.     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 Based on parasagittal imaging, there is no malalignment. Degeneration of all lumbar disks with variable loss disc space height. The conus occupies a normal location and is normal in appearance. There is no intraspinal mass. Prominent degenerative endplate changes are present at L2-3 without edema. There are subtle edematous endplate changes L3-4, L4-5 and L5-S1 along with L1-2.     At the T11-12 and T12-L1 levels, there is bilateral posterior facet hypertrophy present, minimal diffuse disc bulge. There is no canal stenosis at either level. Minimal foraminal compromise bilaterally.     At L1-2, there is mild diffuse disc bulge with osteophyte associated with mild deformity of the ventral thecal sac. Mild bilateral posterior hypertrophy ligamentous thickening. No canal stenosis. There is mild foraminal encroachment left greater than right this level.     At L2-3, diffuse disc bulge with osteophyte formation is present, subtly eccentric left, associated with deformity of the ventral thecal sac. There is mild bilateral posterior hypertrophy left greater than right with mild ligamentous thickening. No canal stenosis, however, there is moderately severe foraminal encroachment on the left at this level with milder foraminal encroachment on the right.     At L3-4, diffuse disc bulge with osteophyte formation is present associated with mild deformity of the ventral thecal sac. Mild posterior facet hypertrophy. There is no canal stenosis. Moderate foraminal encroachment on the left with milder foraminal encroachment on the right.     At L4-5, there is diffuse disc bulge with osteophyte associated with mild deformity of the ventral thecal sac. There is mild to  moderate posterior hypertrophy with ligamentous thickening. Mild central canal narrowing at this level without frank stenosis. There is moderately severe foraminal encroachment on the proximal right neural foramen at this level with moderate foraminal encroachment on the left.     At L5-S1, there is diffuse disc bulge with osteophyte associated with mild deformity of the ventral thecal sac. Moderate posterior facet hypertrophy with ligamentous thickening is present. There is no canal stenosis, however, degenerative factors combine to result in severe encroachment on the proximal right neural foramen at this level with moderately severe foraminal encroachment on the left.     IMPRESSION: Degenerative changes are present throughout the lumbar region, as detailed above. There is no canal stenosis at any level, however, there is marked multilevel foraminal encroachment present, please see details above.  PMH/PSxHx:  Past Medical History:   Diagnosis Date    Atrial fibrillation (HCC)     GERD (gastroesophageal reflux disease)     Hypertension      Past Surgical History:   Procedure Laterality Date    APPENDECTOMY      BREAST BIOPSY  CESAREAN SECTION      NECK SURGERY      fusion       FAMILY HISTORY:  Family History   Problem Relation Age of Onset    Cancer Father        SOCIAL HISTORY:  Social History     Social History Narrative    Not on file       MEDICATIONS:    Current Outpatient Medications:     LORazepam  (ATIVAN ) 0.5 MG tablet, Take 1 tablet by mouth nightly for 30 days. Max Daily Amount: 0.5 mg, Disp: 30 tablet, Rfl: 0    Ferrous Sulfate ER 50 MG TBCR, Take 1 tablet by mouth daily, Disp: , Rfl:     ELIQUIS  5 MG TABS tablet, TAKE 1 TABLET TWICE DAILY, Disp: 180 tablet, Rfl: 3    metoprolol  tartrate (LOPRESSOR ) 25 MG tablet, TAKE 1 TABLET TWICE DAILY, Disp: 180 tablet, Rfl: 3    irbesartan  (AVAPRO ) 150 MG tablet, TAKE 1 TABLET EVERY NIGHT, Disp: 90 tablet, Rfl: 3    pantoprazole  (PROTONIX ) 40 MG tablet, Take 1  tablet by mouth in the morning and at bedtime (Patient taking differently: Take 1 tablet by mouth daily), Disp: , Rfl:     ferrous sulfate dried (SLOW RELEASE IRON ) 160 (50 Fe) MG TBCR extended release tablet, Take 160 mg by mouth 2 times daily (Patient not taking: Reported on 01/15/2024), Disp: 30 tablet, Rfl: 5    ALLERGIES:  No Known Allergies    Physical Exam    General/Constitutional: Patient is seated comfortably in NAD, well-groomed.   Head/Eyes: Head atraumatic, EOMI   ENT: Hearing grossly normal   Respiratory: Breathing comfortably on RA, normal respiratory effort, no audible wheeze   Cardiovascular: 2+ peripheral radial and DP pulses, no LE edema noted   GI: abdomen non-distended, non-tender to palpation   GU: deferred   Skin: No appreciable rashes or skin breakdown   Hematologic/Lymphatic: no cervical or axillary lymphadenopathy noted   Psychiatric: Appropriate affect, A&Ox3 , answers questions appropriately, short and long term memory grossly normal, normal insight   Lumbar exam:    Musculoskeletal:    Inspection: Normal lumbar coronal and sagittal contours.    Stations:  Independent transfers from sitting to standing.    Gait:  Within normal limits.  Heel-toe and tandem walking intact.    Palpation: + tenderness over bilateral lumbar paravertebral musculature.    Range of motion: Lumbar spine range of motion is reduced by 50% with pain on extension and rotation    Special tests:  Negative sacroiliac joint tests (distraction, compression, thigh thrust, Faber's, Patrick's, Production Manager, Gaenslen's)    Neurological:    Motor: 5/5 throughout right and left lower extremities.  Normal tone without atrophy or abnormal movements.      Sensory: Grossly intact to light touch and pinprick throughout the right and left lower extremities.    DTRs:  2+ and symmetric at right and left patellar and Achilles.    Special tests: Negative right and left straight leg raise.  Right and left plantar responses are flexor  nature.  No clonus about the ankles.    Assessment & Plan      1. Lumbar radiculopathy  Assessment & Plan:   Katelynn presents for follow up evaluation.  She last underwent a right L4-5 and L5-S1 transforaminal epidural steroid injection on 01/08/23.  Unfortunately, this has not provided her with any relief for pain.  Since that time, her pain has persisted.  She continues to have significant right-sided low back pain with radiation down her right leg.  The pain is made worse with any prolonged activity.  She denies any numbness or tingling.    Unfortunately, she has failed extensive conservative treatment.  I would not recommend any further injections, as these have not been helpful for her.  We discussed medication options and surgical referral.  Patient would prefer to avoid this for now, we will further discuss this pending her response to the lumbar medial branch blocks.  2. Lumbar spondylosis  Assessment & Plan:   The patients' major concern is chronic right-sided low back pain.  We reviewed her previous lumbar MRI, which shows significant degenerative endplate changes at L2-3, severe disc degeneration at each level of the lumbar spine.  There is severe foraminal narrowing from L2-3 through L5-S1.  Patient has previously undergone epidural steroid injections, as well as her home exercise program without any relief of her pain.  Thus we discussed targeting the facet joints since this may be a contributing factor.  Based on her MRI findings and exam, I have recommended right L2-3 and L3-4 medial branch blocks with an eye towards rhizotomy, and patient would like to proceed.    Risks and benefits or MBB were reviewed and discussed. They include: infection, pain flare or no anesthetic response     Patient understands the goal of this procedure is to see if they have a 80% decrease in pain in the first 8 hours after the injections. They agree to complete their homework (documenting on pain paperwork every hour for 8  hours or until they go to bed).      They will contact the office the following day with the data. If pain rates are reduced by 80+% a rhizotomy will be scheduled.     Consent will be signed prior to the procedure     Discussed risks and benefits of rhizotomy which include: infection, post-procedure pain flare, swelling. It may take 1 day to 3-4 weeks for optimum benefit to occur. They understand the consent will be reviewed and signed at pre-procedure evaluation.     Patient denies allergy to contrast dye or iodine, diabetes, use of aspirin or blood thinners, pacemaker, SCS, or internal devices    Orders:  -     Amb Referral to Outpatient Department         LEVEL OF SERVICE BASED ON TIME:  I personally spent a total of 40 minutes on today's visit doing chart preparation, review of previous records/labs/imaging, performing a medically appropriate evaluation, counseling and educating the patient, and documenting clinical information in the electronic health record.    A copy of this consultation note will be sent to the requesting provider.    The patient (or guardian, if applicable) and other individuals in attendance with the patient were advised that Artificial Intelligence will be utilized during this visit to record, process the conversation to generate a clinical note, and support improvement of the AI technology. The patient (or guardian, if applicable) and other individuals in attendance at the appointment consented to the use of AI, including the recording.      CC:  Elisha, Belinda L, MD  [address]  [place],  [place] [address]   "

## 2024-01-18 NOTE — Result Encounter Note (Signed)
"  Call placed to patient to review concerns noted.  Left message at contact number listed requesting return call.    01/18/2024 10:51 AM    "

## 2024-01-18 NOTE — Result Encounter Note (Signed)
"  Please notify patient that her iron  profile is within normal range with improving iron  storage.   "

## 2024-01-19 NOTE — Telephone Encounter (Signed)
"  Referral for Right L2-3, L3-4 Medial branch block.  Spoke with patient, sched for 02/04/24 @9 :15 arrival time. Confirmed procedure and [facility]PD location.    "

## 2024-02-04 ENCOUNTER — Inpatient Hospital Stay: Admit: 2024-02-04 | Payer: Medicare (Managed Care) | Attending: Physical Medicine & Rehabilitation

## 2024-02-04 VITALS — BP 149/44 | HR 60 | Temp 96.40000°F | Resp 18 | Ht 62.0 in | Wt 95.0 lb

## 2024-02-04 DIAGNOSIS — M47816 Spondylosis without myelopathy or radiculopathy, lumbar region: Principal | ICD-10-CM

## 2024-02-04 MED ORDER — LIDOCAINE HCL 1 % IJ SOLN
1 | Freq: Once | INTRAMUSCULAR | Status: AC
Start: 2024-02-04 — End: 2024-02-04
  Administered 2024-02-04: 15:00:00 5 mL via SUBCUTANEOUS

## 2024-02-04 MED FILL — LIDOCAINE HCL 1 % IJ SOLN: 1 % | INTRAMUSCULAR | Qty: 20

## 2024-02-04 NOTE — Progress Notes (Signed)
"  Physiatry Procedure   History and plan of care reviewed.   Pt ambulated to room with steady gait  Procedure: right L2/3, L3/4 MBB #1     Physician performing procedure room:   Dr. Clovia   Staff 1 in room: Nat RN   Staff 2 in room: Candace XRT       Time out completed: 1008   * Patient was identified by name and date of birth   * Agreement on procedure being performed was verified  * Risks and Benefits explained to the patient  * Procedure site verified and marked as necessary  * Patient was positioned for comfort  * Consent was signed and verified   *Pt transferred to procedure table independently     Start time: 1013   Stop time: 1015   Pt tolerated procedure well, Pt transferred off table independently      Time left room: 1018   Method of transport: ambulated   Report given to: Dancile RN   "

## 2024-02-04 NOTE — Interval H&P Note (Signed)
"  Update History & Physical    The patient's History and Physical of January 15, 2024 was reviewed with the patient and I examined the patient. There was no change. The surgical site was confirmed by the patient and me.     Plan: The risks, benefits, expected outcome, and alternative to the recommended procedure have been discussed with the patient. Patient understands and wants to proceed with the procedure.     Electronically signed by Florette JONETTA Diener, MD on 02/04/2024 at 1009 AM      "

## 2024-02-04 NOTE — Op Note (Signed)
"  Operative Note      Patient: Shelly Perkins  Date of Birth: 1950-11-29  MRN: [id number]    Date of Service:  02/04/24    Procedure:  right L2-L4 Medial Branch Block #1    Pre-operative & Post Diagnosis:  lumbar spondylosis without myelopathy    Indications:  To diagnose as well as to confirm whether this patient would be a good candidate for Rhizotomy.    Contraindications: No contraindications exist such as bacterial infection, bleeding, disease(s), allergies to local anesthetic or radio contrast material, or spinal anatomical abnormalities. The patient's history and physical exam shows no contraindication for the proposed procedure.    Consent/Time Out: Risks and benefits were reviewed with the patient, consent was signed.  Time-Out performed, verified patient identification (name and date of birth), verified procedure, verified site/side - marked, verified correct patient position, special equipment/implants available if applicable, medications/allergies/relevant history reviewed, and required imaging and test results available.    Sedation: None    Position: lying prone    Description of Procedure:  1. Sterile technique utilized.    2. Needle placement/injection: Under intermittent fluoroscopic guidance, a 25 gauge, 3.5 inch needle was advanced to the periosteum of the target medial branch/dorsal rami. After negative aspiration, 0.5 mL of 1% lidocaine  was injected at the nerve site.    Complications: There were no technical difficulties. The patient tolerated the procedure well and was discharged to the recovery room.    Post Procedural Care/Assessment:   - Please see nursing pre and post procedure assessment forms.   - The patient's response to the procedure was appropriate.   - A postprocedure follow-up was scheduled to evaluate patient's clinical status.    Comments:  Pre-procedure pain level was 9/10, post-procedure pain level was 3/10    Fluoroscopy:  0.437 mGy/0.5 min    "

## 2024-02-04 NOTE — Discharge Instructions (Signed)
"  [facility]  [address]  [place], [place]  [address]  [phone]    Thank you for choosing [facility].  Our team works together to offer you the best quality care and comprehensive pain management.    FOLLOW UP: call the office tomorrow to report your homework results    TODAY'S PROCEDURE: right lumbar medial branch nerve blocks #1 (first test procedure to see if you might be a candidate for rhizotomy)    You received the following medications during the procedure:  lidocaine       DISCHARGE INSTRUCTIONS:  - Resume activity as tolerated - test the nerve block at least once an hour by doing something that typically increases your pain, and document on your homework  - You may resume your as needed pain medications after the pain homework scale is complete, or once your pain has returned to baseline  - Resume all medication  - You may shower and remove the bandage tomorrow  - Do not submerge the injection site in water (pool, hot tub, bath, etc) for 72 hours  - No heat for 24 hours to injection site(s)  - You may apply ice to the injection site(s) as needed    COMMON SYMPTOMS YOU MAY EXPERIENCE TODAY:    Heavy/Weak Limb(s)  Soreness at injection site    If these symptoms do not start resolving in 24 hours, please call the office.      Call the office if you have any questions or if you have any of the following:  - Excessive redness or swelling at injection site  - Excessive bleeding from injection site  - Excessive pain or headache unrelieved with your regular pain medication  - Increasing numbness/tingling  - New persistent weakness  - Fever greater than 101 degrees within 24 hours    IF YOU ARE EXPERIENCING SHORTNESS OF BREATH, INABILITY TO URINATE, NEW INABILITY TO WALK, OR NEW SWELLING, PLEASE SEEK EMERGENCY CARE (CALL 911) AS THESE COULD BE SIGNS OF SERIOUS COMPLICATIONS.  "

## 2024-02-05 ENCOUNTER — Telehealth

## 2024-02-05 NOTE — Telephone Encounter (Signed)
"  Please check order and sign    Pre procedure pain 9 post 3    Date of Service:  02/04/24    80 % of relief after procedure.     Procedure:  right L2-L4 Medial Branch Block #1      Results (the letter circled for each hour):                     Hour 1   B 1              Hour 2   B 1                     Hour 3   B 1                     Hour 4   B 1                     Hour 5   B 1                     Hour 6   C 3                      Hour 7   C 3                     Hour 8   C 3            When did pain return? This morning   Where is pain located? Right side of lumbar spine  Level of pain now? 6    Diabetic    no  Dye or iodine allergy   no  On blood thinners   eliquis   On Aspirin    no  Has a pacemaker or other internal electrical device no  Do you have a history of thrombocytopenia/bleeding problems  no  .Suzen DELENA Kitty, MA        "

## 2024-02-05 NOTE — Telephone Encounter (Signed)
"  Patient Name & DOB verified.  Pt is calling to report post-nerve block result    Date of procedure:  12/11  Procedure done:  right lumbar nerve block      Location of Pain:  lower back   Functional Goal/Activity: ok   felt better in some areas.  But there are other spots that are sore     Results (the letter circled for each hour):     Hour 1   B   Hour 2   B    Hour 3   B    Hour 4   B    Hour 5   B    Hour 6   C    Hour 7   C    Hour 8    C       Other Functions that improved during the 8 hours (such as ability to lift, walk, bend, etc.).  yes    Has pain returned? YES or NO  yes    What is your current pain level (1-10)?   6    Will send this to provider to review.    Best number to reach Pt at:   [phone] (home)      "

## 2024-02-08 NOTE — Telephone Encounter (Signed)
"  Order reviewed and signed    Neil Gaskin, FNP    "

## 2024-02-11 NOTE — Telephone Encounter (Signed)
"  Referral for Right L2-4 Medial branch block # 2.  Spoke with patient, sched for 03/03/24 @9 :45 arrival time. Confirmed procedure and OPD location.    "

## 2024-02-17 ENCOUNTER — Encounter

## 2024-02-17 MED ORDER — LORAZEPAM 0.5 MG PO TABS
0.5 | ORAL_TABLET | Freq: Every evening | ORAL | 0 refills | 30.00000 days | Status: AC
Start: 2024-02-17 — End: 2024-03-24

## 2024-02-17 NOTE — Telephone Encounter (Signed)
 SABRA

## 2024-02-17 NOTE — Telephone Encounter (Signed)
"  CONTROLLED SUBSTANCE REFILL REQUEST    CLINICAL STAFF ACTION: PMP/PDMP reviewed and appropriate.   Pill Count up to date, UDS up to date, and CSA up to date.  RX pended for signature      Requested Prescriptions     Pending Prescriptions Disp Refills    LORazepam  (ATIVAN ) 0.5 MG tablet [Pharmacy Med Name: LORAZEPAM  0.5MG  TABLETS] 30 tablet      Sig: TAKE 1 TABLET BY MOUTH EVERY NIGHT. MAX DAILY AMOUNT: 0.5 MG       MEDICATION #1  Next due date:  02/07/24  Last prescription refill date: 01/08/24  Number of pills given with last prescription: 30  Maximum pills per day: 1    Last office visit: 11/04/2023     Future Appointments   Date Time Provider Department Center   03/03/2024  9:45 AM [facility]   11/03/2024  2:00 PM Delene Jerelene CROME, MD [facility] AM[facility]        Controlled Substance Flowsheet (last value only)  C[facility] Controlled Substance Flowsheet Medication contract reviewed and signed PMP Reviewed UDS   01/08/2024 09/16/2023 01/08/2024 09/16/2023   11/19/2023 09/16/2023 11/19/2023 09/16/2023   09/16/2023 09/16/2023 09/16/2023    08/25/2023 09/16/2021 08/25/2023    06/29/2023 09/16/2021 06/29/2023    03/27/2023 09/16/2021 03/27/2023    02/09/2023 09/16/2021 12/17/2022    12/17/2022 09/16/2021 12/17/2022            Documented by: Olivia Rio, RN    "

## 2024-02-22 ENCOUNTER — Encounter

## 2024-02-22 MED ORDER — APIXABAN 5 MG PO TABS
5 | ORAL_TABLET | Freq: Two times a day (BID) | ORAL | 3 refills | 30.00000 days | Status: DC
Start: 2024-02-22 — End: 2024-03-28

## 2024-02-22 MED ORDER — METOPROLOL TARTRATE 25 MG PO TABS
25 | ORAL_TABLET | Freq: Two times a day (BID) | ORAL | 3 refills | 90.00000 days | Status: DC
Start: 2024-02-22 — End: 2024-03-28

## 2024-02-22 MED ORDER — IRBESARTAN 150 MG PO TABS
150 | ORAL_TABLET | Freq: Every day | ORAL | 3 refills | 90.00000 days | Status: AC
Start: 2024-02-22 — End: ?

## 2024-02-22 NOTE — Telephone Encounter (Signed)
 "PRESCRIPTION REFILL REQUEST     irbesartan  (AVAPRO ) 150 MG tablet         Sig: Take 1 tablet by mouth    Disp: 90 tablet    Refills: 3    Start: 02/22/2024    Class: Normal    For: Essential hypertension    Last ordered: 1 year ago (10/22/2022) by Deatrice DELENA Marker, MD    ARB Refill Protocol Passed12/29/2025 01:52 PM   Protocol Details Last creatinine level resulted within the past 12 months    Last potassium level normal, within the past 12 months    Visit with authorizing provider in past 9 months or upcoming 90 days       metoprolol  tartrate (LOPRESSOR ) 25 MG tablet         Sig: Take 1 tablet by mouth 2 times daily    Disp: 180 tablet    Refills: 3    Start: 02/22/2024    Class: Normal    For: Paroxysmal atrial fibrillation (HCC)    Last ordered: 1 year ago (12/17/2022) by Deatrice DELENA Marker, MD    Beta-Blockers Protocol Passed12/29/2025 01:52 PM   Protocol Details Last Pulse reading greater than 50 recorded within past year    Visit with authorizing provider in past 9 months or upcoming 90 days       apixaban  (ELIQUIS ) 5 MG TABS tablet         Sig: Take 1 tablet by mouth 2 times daily    Disp: 180 tablet    Refills: 3    Start: 02/22/2024    Class: Normal    Last ordered: 11 months ago (03/13/2023) by Deatrice DELENA Marker, MD    Factor Xa Inhibitors Passed12/29/2025 01:52 PM   Protocol Details Creatinine on record in the last 12 months    Visit with authorizing provider in past 9 months or upcoming 90 days    CBC on record in the last 12 months          Preferred Pharmacy:   [id number] - [place], [place] - [address] - P [phone] - F [phone]  [address]  [place] [address]  Phone: [phone] Fax: [phone]    [facility] [phone] - [place], [place] - [address] [place] [phone] [place] [phone]  [address]  [place] [address]  Phone: [phone] Fax: [phone]      Last appt @ PCP Office: 11/04/2023   Future Appointments   Date Time Provider Department Center   03/03/2024  9:45 AM [facility] OPD MINOR  ONE RM [facility]D SML   11/03/2024  2:00 PM Delene Jerelene CROME, MD [facility] AMB       MOST RECENT BLOOD PRESSURES  BP Readings from Last 3 Encounters:   02/04/24 (!) 149/44   01/15/24 (!) 108/54   11/04/23 132/60       MOST RECENT LAB DATA  Lab Results   Component Value Date/Time    K 4.3 11/07/2023 10:29 AM    TSH 1.210 06/29/2023 01:52 PM    HGB 10.2 11/07/2023 10:29 AM    HCT 31.7 11/07/2023 10:29 AM    INR 1.0 06/30/2021 12:58 PM     Lab Results   Component Value Date    HDL 59 11/07/2023    TRIG 88 11/07/2023    LDLEXT 63.4 07/28/2022    HDLEXT 47 07/28/2022    TRIGLYCEXT 113 07/28/2022    ALT 18 11/07/2023     Hemoglobin A1C (%)  Date Value   11/07/2023 4.6      No components found for: MALBCRRATEXT, LABMICR       "

## 2024-02-22 NOTE — Telephone Encounter (Signed)
"  Patient's name and date of birth verified at start of call.   Incoming refill request.  Caller has been notified that we require a minimum of 2 business days for refill processing. (This does not include weekends, holidays, etc.)      Shelly Perkins  06-17-1950      Confirmed best contact number:   Home Phone [phone] (home)    Medications Requested:  Requested Prescriptions     Pending Prescriptions Disp Refills    irbesartan  (AVAPRO ) 150 MG tablet 90 tablet 3     Sig: Take 1 tablet by mouth    metoprolol  tartrate (LOPRESSOR ) 25 MG tablet 180 tablet 3     Sig: Take 1 tablet by mouth 2 times daily    apixaban  (ELIQUIS ) 5 MG TABS tablet 180 tablet 3     Sig: Take 1 tablet by mouth 2 times daily       Preferred Pharmacy:   [id number] - [place], [place] - [address] - P [phone] [place] [phone]  [address]  [place] [address]  Phone: [phone] Fax: [phone]      Has patient already contacted pharmacy to confirm no refills were on file: Yes    Notes for office regarding medication request:          Other instructions and notes:  Last visit with provider: 11/04/2023  Next scheduled visit with provider: 11/03/2024  *If next visit is not scheduled, book next needed visit or document if recall was added to list prior to sending for processing*  Inform patient that this needs to be on file before sending request as we do require for them to remain up-to-date with their recommended healthcare in order to avoid any interruptions in our ability to provide ongoing care such as refills.     Patient MyChart Status:  For Active Patients - Patient has been notified that they will receive an automated notification via MyChart once their script has been processed.   For Inactive Patients - Patient is aware that we have a patient portal, MyChart, which offers many benefits such as being able to request their refills electronically and receive automated notifications when scripts are processed.  In addition, they can  schedule and manage appointments, view their testing results and visit notes, and stay connected with their care team.  Patient offered MyChart today.  Patient Active.  (Send link for set up if accepted)  "

## 2024-02-29 NOTE — Telephone Encounter (Signed)
"  Pt name and DOB verified.    Patient hsa an OPD on 03/03/24 however she now has new insurance    [employer] HMO    Writer entered into the registration    Member ID # is [id number]    Call if any issues with the appointment    [phone] (home)             "

## 2024-02-29 NOTE — Telephone Encounter (Signed)
"  Noted thank you.  New coverage added to reg and auth obtained.  Patient all set for 1/08 injection.  "

## 2024-02-29 NOTE — Telephone Encounter (Signed)
"  Pt name and DOB verified.    Upon rechecking the registration it does not seem to have taken, even though it came back on the query    [phone] (home)     "

## 2024-03-02 NOTE — Discharge Instructions (Signed)
"  [facility]  [address]  [place], [place]  [address]  [phone]    Thank you for choosing [facility].  Our team works together to offer you the best quality care and comprehensive pain management.    FOLLOW UP: call the office tomorrow to report your homework results    TODAY'S PROCEDURE: right lumbar medial branch nerve blocks #2 (second test procedure to confirm that you are a candidate for rhizotomy)    You received the following medications during the procedure:  bupivacaine     DISCHARGE INSTRUCTIONS:  - Resume activity as tolerated - test the nerve block at least once an hour by doing something that typically increases your pain, and document on your homework  - You may resume your as needed pain medications after the pain homework scale is complete, or once your pain has returned to baseline  - Resume all medication  - You may shower and remove the bandage tomorrow  - Do not submerge the injection site in water (pool, hot tub, bath, etc) for 72 hours  - No heat for 24 hours to injection site(s)  - You may apply ice to the injection site(s) as needed    COMMON SYMPTOMS YOU MAY EXPERIENCE TODAY:    Heavy/Weak Limb(s)  Soreness at injection site    If these symptoms do not start resolving in 24 hours, please call the office.      Call the office if you have any questions or if you have any of the following:  - Excessive redness or swelling at injection site  - Excessive bleeding from injection site  - Excessive pain or headache unrelieved with your regular pain medication  - Increasing numbness/tingling  - New persistent weakness  - Fever greater than 101 degrees within 24 hours    IF YOU ARE EXPERIENCING SHORTNESS OF BREATH, INABILITY TO URINATE, NEW INABILITY TO WALK, OR NEW SWELLING, PLEASE SEEK EMERGENCY CARE (CALL 911) AS THESE COULD BE SIGNS OF SERIOUS COMPLICATIONS.    "

## 2024-03-03 ENCOUNTER — Inpatient Hospital Stay
Admit: 2024-03-03 | Discharge: 2024-03-07 | Payer: Medicare (Managed Care) | Attending: Physical Medicine & Rehabilitation

## 2024-03-03 VITALS — BP 140/56 | HR 59 | Temp 97.00000°F | Resp 18 | Ht 62.0 in | Wt 90.0 lb

## 2024-03-03 DIAGNOSIS — M47816 Spondylosis without myelopathy or radiculopathy, lumbar region: Principal | ICD-10-CM

## 2024-03-03 MED ORDER — BUPIVACAINE HCL (PF) 0.5 % IJ SOLN
0.5 | Freq: Once | INTRAMUSCULAR | Status: AC
Start: 2024-03-03 — End: 2024-03-03
  Administered 2024-03-03: 15:00:00 25 mL

## 2024-03-03 MED FILL — BUPIVACAINE HCL (PF) 0.5 % IJ SOLN: 0.5 % | INTRAMUSCULAR | Qty: 10 | Fill #0

## 2024-03-03 NOTE — H&P (View-Only) (Signed)
 HISTORY AND PHYSICAL             Date: 03/03/2024       Patient Name: Shelly Perkins     Date of Birth: Oct 17, 1950      Age:  74 y.o.    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  She had right L2-4 MBBs #1 on 02/04/24, which were diagnostic.    Per [facility]'s clinic note HPI on 01/15/24:  She was last seen on 12/05/2022 and underwent a right L4-5 and L5-S1 transforaminal epidural steroid injection on 01/08/2023. Since then, she reports a worsening of her condition, with pain now radiating down both arms and into her thumbs. This new symptom, characterized by sharp pain extending into her fingers, has been present for several months. Additionally, she continues to experience persistent back pain, which she believes has worsened despite undergoing two or three injections without relief. Pain radiates down her right leg and into her feet, causing discomfort when wearing socks. She reports no neck pain but does experience shoulder pain. She maintains flexibility and can walk on her toes, although with some difficulty due to toe pain. She reports no issues with bladder or bowel incontinence or urgency. While she has not experienced any falls, she does report occasional numbness in her fingers.    The primary source of her pain is her back. She experiences constant pain that worsens with prolonged sitting or standing, making it difficult to lie down and stretch out at night. She is considering the use of a lumbar pillow for support while sitting. She uses a spandex knee brace when anticipating extensive walking or stair climbing, which she finds beneficial. She reports no allergies to  contrast dye or iodine and is not diabetic. Her current pain management regimen includes two Arthritis Strength Tylenol  tablets taken nightly before bed. She previously used Excedrin but discontinued it due to the initiation of blood thinners. She has not tried gabapentin or Lyrica.   She has a history of carpal tunnel syndrome, which was surgically corrected. 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 She has undergone neck surgery, during which a metal rod was inserted and bone chips from her left hip were fused into her neck. She recently had a DEXA scan, which showed improvement compared to the previous one.   PAST SURGICAL HISTORY:  She has undergone neck surgery, during which a metal rod was inserted and bone chips from her left hip were fused into her neck.    SOCIAL HISTORY  She does not drink alcohol.   Injections -   11/14/24R L4-5, L5-S1 TESI                0% relief   Surgery - none    She denies any change in health or symptoms in the interim.    Pain level is 7/10 today.      Past Medical History     Past Medical History:   Diagnosis Date    Atrial fibrillation (HCC)     GERD (gastroesophageal reflux disease)     Hypertension  Past Surgical History     Past Surgical History:   Procedure Laterality Date    APPENDECTOMY      BREAST BIOPSY      CESAREAN SECTION      NECK SURGERY      fusion        Medications Prior to Admission     Prior to Admission medications   Medication Sig Start Date End Date Taking? Authorizing Provider   rosuvastatin  (CRESTOR ) 10 MG tablet Take 1 tablet by mouth daily 02/10/24  Yes [provider]   irbesartan  (AVAPRO ) 150 MG tablet Take 1 tablet by mouth daily 02/22/24   Delene Jerelene CROME, MD   metoprolol  tartrate (LOPRESSOR ) 25 MG tablet Take 1 tablet by mouth 2 times daily 02/22/24   Delene Jerelene CROME, MD   apixaban  (ELIQUIS ) 5 MG TABS tablet Take 1 tablet by mouth 2 times daily 02/22/24   Delene Jerelene CROME, MD   LORazepam  (ATIVAN )  0.5 MG tablet Take 1 tablet by mouth nightly for 30 days. Max Daily Amount: 0.5 mg 02/17/24 03/18/24  Baxter, Susan E, MD   Ferrous Sulfate ER 50 MG TBCR Take 1 tablet by mouth daily  Patient not taking: Reported on 02/04/2024 09/21/23   [provider]   ferrous sulfate dried (SLOW RELEASE IRON ) 160 (50 Fe) MG TBCR extended release tablet Take 160 mg by mouth 2 times daily  Patient not taking: No sig reported 06/26/23   Buena Devere BRAVO, MD   pantoprazole  (PROTONIX ) 40 MG tablet Take 1 tablet by mouth in the morning and at bedtime  Patient taking differently: Take 1 tablet by mouth daily 06/28/22   [provider]        Allergies   Patient has no known allergies.    Social History     Social History       Tobacco History       Smoking Status  Every Day Smoking Start Date  02/25/1972 Current Packs/Day  0.3 packs/day Average Packs/Day  0.3 packs/day for 52.0 years (13.0 ttl pk-yrs) Smoking Tobacco Type  Cigarettes since 02/25/1972   Pack Year History     Packs/Day From To Years    0.25 02/25/1972  52.0      Smokeless Tobacco Use  Never      Tobacco Comments  Two packs per week 07/25/22               Alcohol History       Alcohol Use Status  Not Currently Drinks/Week  0 Standard drinks or equivalent per week              Drug Use       Drug Use Status  Never              Sexual Activity       Sexually Active  Not Currently Partners  Female                    Family History     Family History   Problem Relation Age of Onset    Cancer Father        Review of Systems   Review of Systems  No new numbness/tingling/weakness.  No new bladder/bowel dysfunction.  No recent falls.    Physical Exam   BP (!) 146/74   Pulse 86   Temp 97 F (36.1 C) (Tympanic)   Resp 18   Ht 1.575 m (5' 2)   Wt  40.8 kg (90 lb)   SpO2 95%   BMI 16.46 kg/m     Physical Exam  A&Ox3, NAD, well groomed.  Grossly 5/5 strength throughout.  Ambulates independently to and from procedure room.  + TTP right lumbar paraspinals  + right lumbar  facet loading    Labs    No results found for this or any previous visit (from the past 24 hours).     Imaging/Diagnostics Last 24 Hours   No results found.    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  She had right L2-4 MBBs #1 on 02/04/24, which were diagnostic.    Her pain level is 7/10 today.    Plan   Risks and benefits of procedure discussed.  Options, including not doing the procedure today, discussed.  The patient voiced desire to proceed, and informed consent obtained today.  The patient will call with homework results tomorrow.    Consultations Ordered:  None    Electronically signed by Florette JONETTA Diener, MD on 03/03/24 at 1017 AM EST

## 2024-03-03 NOTE — Progress Notes (Signed)
 Physiatry Procedure   History and plan of care reviewed.     Procedure: Rt L2-4 MBB#2     Physician performing procedure room:   Dr. Clovia   Staff 1 in room: Candie,XRT   Staff 2 in room: Faylynn Stamos,RN       Time out completed: 1018   * Patient was identified by name and date of birth   * Agreement on procedure being performed was verified  * Risks and Benefits explained to the patient  * Procedure site verified and marked as necessary  * Patient was positioned for comfort  * Consent was signed and verified   *Pt transferred to procedure table independent     Start time: 1019   Stop time: 10 25  Pt tolerated procedure well, Pt transferred off table independent      Time left room: 1026  Method of transport: ambulates with steady gait     Report given to: Rosina KATHEE PEAK

## 2024-03-03 NOTE — Op Note (Signed)
 Operative Note      Patient: Shelly Perkins  Date of Birth: Oct 14, 1950  MRN: [id number]    Date of Service:  03/03/24    Procedure:  right L2-L4 Medial Branch Block #2    Pre-operative & Post Diagnosis:  lumbar spondylosis without myelopathy    Indications:  To diagnose as well as to confirm whether this patient would be a good candidate for Rhizotomy.    Contraindications: No contraindications exist such as bacterial infection, bleeding, disease(s), allergies to local anesthetic or radio contrast material, or spinal anatomical abnormalities. The patient's history and physical exam shows no contraindication for the proposed procedure.    Consent/Time Out: Risks and benefits were reviewed with the patient, consent was signed.  Time-Out performed, verified patient identification (name and date of birth), verified procedure, verified site/side - marked, verified correct patient position, special equipment/implants available if applicable, medications/allergies/relevant history reviewed, and required imaging and test results available.    Sedation: None    Position: lying prone    Description of Procedure:  1. Sterile technique utilized.    2. Needle placement/injection: Under intermittent fluoroscopic guidance, a 25 gauge, 3.5 inch needle was advanced to the periosteum of the target medial branch/dorsal rami. After negative aspiration, 0.5 mL of 0.5% bupivacaine  was injected at the nerve site.    Complications: There were no technical difficulties. The patient tolerated the procedure well and was discharged to the recovery room.    Post Procedural Care/Assessment:   - Please see nursing pre and post procedure assessment forms.   - The patient's response to the procedure was appropriate.   - A postprocedure follow-up was scheduled to evaluate patient's clinical status.    Comments:  Pre-procedure pain level was 7/10, post-procedure pain level was 0/10    Fluoroscopy:  0.432 mGy/0.5 min

## 2024-03-03 NOTE — H&P (Signed)
 HISTORY AND PHYSICAL             Date: 03/03/2024       Patient Name: Shelly Perkins     Date of Birth: Oct 17, 1950      Age:  74 y.o.    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  She had right L2-4 MBBs #1 on 02/04/24, which were diagnostic.    Per [facility]isa's clinic note HPI on 01/15/24:  She was last seen on 12/05/2022 and underwent a right L4-5 and L5-S1 transforaminal epidural steroid injection on 01/08/2023. Since then, she reports a worsening of her condition, with pain now radiating down both arms and into her thumbs. This new symptom, characterized by sharp pain extending into her fingers, has been present for several months. Additionally, she continues to experience persistent back pain, which she believes has worsened despite undergoing two or three injections without relief. Pain radiates down her right leg and into her feet, causing discomfort when wearing socks. She reports no neck pain but does experience shoulder pain. She maintains flexibility and can walk on her toes, although with some difficulty due to toe pain. She reports no issues with bladder or bowel incontinence or urgency. While she has not experienced any falls, she does report occasional numbness in her fingers.    The primary source of her pain is her back. She experiences constant pain that worsens with prolonged sitting or standing, making it difficult to lie down and stretch out at night. She is considering the use of a lumbar pillow for support while sitting. She uses a spandex knee brace when anticipating extensive walking or stair climbing, which she finds beneficial. She reports no allergies to  contrast dye or iodine and is not diabetic. Her current pain management regimen includes two Arthritis Strength Tylenol  tablets taken nightly before bed. She previously used Excedrin but discontinued it due to the initiation of blood thinners. She has not tried gabapentin or Lyrica.   She has a history of carpal tunnel syndrome, which was surgically corrected. 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 She has undergone neck surgery, during which a metal rod was inserted and bone chips from her left hip were fused into her neck. She recently had a DEXA scan, which showed improvement compared to the previous one.   PAST SURGICAL HISTORY:  She has undergone neck surgery, during which a metal rod was inserted and bone chips from her left hip were fused into her neck.    SOCIAL HISTORY  She does not drink alcohol.   Injections -   11/14/24R L4-5, L5-S1 TESI                0% relief   Surgery - none    She denies any change in health or symptoms in the interim.    Pain level is 7/10 today.      Past Medical History     Past Medical History:   Diagnosis Date    Atrial fibrillation (HCC)     GERD (gastroesophageal reflux disease)     Hypertension  Past Surgical History     Past Surgical History:   Procedure Laterality Date    APPENDECTOMY      BREAST BIOPSY      CESAREAN SECTION      NECK SURGERY      fusion        Medications Prior to Admission     Prior to Admission medications   Medication Sig Start Date End Date Taking? Authorizing Provider   rosuvastatin  (CRESTOR ) 10 MG tablet Take 1 tablet by mouth daily 02/10/24  Yes [provider]   irbesartan  (AVAPRO ) 150 MG tablet Take 1 tablet by mouth daily 02/22/24   Delene Jerelene CROME, MD   metoprolol  tartrate (LOPRESSOR ) 25 MG tablet Take 1 tablet by mouth 2 times daily 02/22/24   Delene Jerelene CROME, MD   apixaban  (ELIQUIS ) 5 MG TABS tablet Take 1 tablet by mouth 2 times daily 02/22/24   Delene Jerelene CROME, MD   LORazepam  (ATIVAN )  0.5 MG tablet Take 1 tablet by mouth nightly for 30 days. Max Daily Amount: 0.5 mg 02/17/24 03/18/24  Baxter, Susan E, MD   Ferrous Sulfate ER 50 MG TBCR Take 1 tablet by mouth daily  Patient not taking: Reported on 02/04/2024 09/21/23   [provider]   ferrous sulfate dried (SLOW RELEASE IRON ) 160 (50 Fe) MG TBCR extended release tablet Take 160 mg by mouth 2 times daily  Patient not taking: No sig reported 06/26/23   Buena Devere BRAVO, MD   pantoprazole  (PROTONIX ) 40 MG tablet Take 1 tablet by mouth in the morning and at bedtime  Patient taking differently: Take 1 tablet by mouth daily 06/28/22   [provider]        Allergies   Patient has no known allergies.    Social History     Social History       Tobacco History       Smoking Status  Every Day Smoking Start Date  02/25/1972 Current Packs/Day  0.3 packs/day Average Packs/Day  0.3 packs/day for 52.0 years (13.0 ttl pk-yrs) Smoking Tobacco Type  Cigarettes since 02/25/1972   Pack Year History     Packs/Day From To Years    0.25 02/25/1972  52.0      Smokeless Tobacco Use  Never      Tobacco Comments  Two packs per week 07/25/22               Alcohol History       Alcohol Use Status  Not Currently Drinks/Week  0 Standard drinks or equivalent per week              Drug Use       Drug Use Status  Never              Sexual Activity       Sexually Active  Not Currently Partners  Female                    Family History     Family History   Problem Relation Age of Onset    Cancer Father        Review of Systems   Review of Systems  No new numbness/tingling/weakness.  No new bladder/bowel dysfunction.  No recent falls.    Physical Exam   BP (!) 146/74   Pulse 86   Temp 97 F (36.1 C) (Tympanic)   Resp 18   Ht 1.575 m (5' 2)   Wt  40.8 kg (90 lb)   SpO2 95%   BMI 16.46 kg/m     Physical Exam  A&Ox3, NAD, well groomed.  Grossly 5/5 strength throughout.  Ambulates independently to and from procedure room.  + TTP right lumbar paraspinals  + right lumbar  facet loading    Labs    No results found for this or any previous visit (from the past 24 hours).     Imaging/Diagnostics Last 24 Hours   No results found.    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  She had right L2-4 MBBs #1 on 02/04/24, which were diagnostic.    Her pain level is 7/10 today.    Plan   Risks and benefits of procedure discussed.  Options, including not doing the procedure today, discussed.  The patient voiced desire to proceed, and informed consent obtained today.  The patient will call with homework results tomorrow.    Consultations Ordered:  None    Electronically signed by Florette JONETTA Diener, MD on 03/03/24 at 1017 AM EST

## 2024-03-04 ENCOUNTER — Telehealth

## 2024-03-04 NOTE — Telephone Encounter (Signed)
 Patient Name & DOB verified.  Pt is calling to report post-nerve block result    Date of procedure:  03/03/24  Procedure done:  nerve block    What is your current pain level (1-10)?  6-7  Best number to reach Pt at: [phone]

## 2024-03-07 NOTE — Telephone Encounter (Signed)
 Appointment 01/15/2024 with AS.    The referral has been initiated.  Please review, edit for follow-up plan, and sign.  Thank you.    Date of Service:  03/03/24   Procedure:  right L2-L4 Medial Branch Block #2    Pre-procedure pain level was 7/10, post-procedure pain level was 0/10     Results (the letter circled for each hour):     Hour 1 (A) 0/10   Hour 2 (A) 0/10   Hour 3 (B) 1/10   Hour 4 (B) 1/10   Hour 5 (B) 1/10   Hour 6 (E) 7/10   Hour 7 (E) 7/10   Hour 8 (E)       7/10    Diagnostic block. The patient had > 80% relief and functional improvement for the duration of local anesthetic.      Other Functions that improved during the 8 hours: stair climbing, and walking the dogs, and repositioning.     At this time the pt was instructed on the pre and post rhizo expectations.     The pt understands it may take 1-4 weeks post rhizotomy before there is a noted improvement in the pain intensity.     The pt was given the opportunity to ask questions.    Diabetic denies     Dye or iodine allergy denies    On blood thinners Eliquis     On Aspirin  denies    Has a pacemaker or other internal electrical device denies  Do you have a history of thrombocytopenia/bleeding problems denies    The pt understands the above will be shared with the provider.     The pt understands the procedure will be scheduled once the referral has been processed.     The pt stated an understanding and was very appreciative of the call. The pt was encouraged to call the office with any additional concerns.

## 2024-03-07 NOTE — Telephone Encounter (Signed)
"  Order reviewed and signed    Neil Gaskin, FNP    "

## 2024-03-09 NOTE — Telephone Encounter (Signed)
 Referral for Right L2-L4 Rhizotomy.  Auth request submitted via [employer] portal for [employer]. Pending clinical review.  Clinicals uploaded.  Once decision is rec'd, will contact patient.

## 2024-03-16 NOTE — Telephone Encounter (Signed)
 Rec'd auth.  LMOM for call back to sched.  Advised I am In the office Monday thru Friday from 8:00 - 3:30.

## 2024-03-17 NOTE — Telephone Encounter (Signed)
 Spoke with patient, sched for 03/24/24 @10 :30 arrival time. Confirmed procedure and [facility] location.

## 2024-03-17 NOTE — Telephone Encounter (Signed)
 Pt name and DOB verified.    Pt is returning a call to [facility].   Writer is unable to reach.   Please call back.  CB:[phone]

## 2024-03-24 ENCOUNTER — Inpatient Hospital Stay: Admit: 2024-03-24 | Payer: Medicare (Managed Care) | Attending: Physical Medicine & Rehabilitation

## 2024-03-24 DIAGNOSIS — M47816 Spondylosis without myelopathy or radiculopathy, lumbar region: Principal | ICD-10-CM

## 2024-03-24 MED ORDER — LIDOCAINE HCL 1 % IJ SOLN
1 | Freq: Once | INTRAMUSCULAR | Status: AC
Start: 2024-03-24 — End: 2024-03-24
  Administered 2024-03-24: 16:00:00 20 mL via SUBCUTANEOUS

## 2024-03-24 MED FILL — LIDOCAINE HCL 1 % IJ SOLN: 1 % | INTRAMUSCULAR | Qty: 20

## 2024-03-24 NOTE — Progress Notes (Signed)
 Rhizotomy Procedure   Pt ambulated to room with steady gait  History and plan of care reviewed.   Procedure: right L2-4 rhizotomy     Physician performing procedure   Dr. Clovia  Staff 1 in room [place] RN   Staff 2 in room [place] XRT     Time out completed: 1053  * Patient was identified by name and date of birth   * Agreement on procedure being performed was verified  * Risks and Benefits explained to the patient  * Procedure site verified and marked as necessary  * Patient was positioned for comfort  * Consent was signed and verified    Pt transferred to procedure table independently     Start time: 1057   Cautery used: Rhizotomy unit   Cautery pad location right flank   Lot # [id number]   Skin integrity intact without injury. No redness, burn or blister noted when pad removed.     Stop time: 1110   Pt tolerated procedure well  Pt transferred off table with stand by assist    Time left room: 1112   Method of transport: ambulated   Report given to: [employer]

## 2024-03-24 NOTE — Op Note (Signed)
 Operative Note      Patient: Shelly Perkins  Date of Birth: 1950-09-01  MRN: [id number]    Date of Service:  03/24/24    Procedure:  right L2-L4 medial branch rhizotomy    Pre-operative & Post Diagnosis: lumbar spondylosis without myelopathy    Indications:   To reduce pain associated with the facet joints that has been identified as the pain generator using a double diagnostic block protocol.   - no prior spinal pain at the treated levels.  - pain is non-radicular.  - the patient has failed >3 months of conservative therapy.  - minimum of 6 months prior to repeat previous rhizotomy with greater than 50% relief of target pain.    Contraindications: No contraindications exist such as bacterial infection, bleeding, disease(s), allergies to local anesthetic or radio contrast material, or spinal anatomical abnormalities. The patient's history and physical exam shows no contraindication for the proposed procedure.    Consent/Time Out: Risks and benefits were reviewed with the patient, consent was signed.  Time-Out performed, verified patient identification (name and date of birth), verified procedure, verified site/side - marked, verified correct patient position, special equipment/implants available if applicable, medications/allergies/relevant history reviewed, and required imaging and test results available.    Sedation:  None    Position: Prone    Description of  Procedure:  1. Preparation: Chloraprep was used to clean the skin.    2. Adequate skin and subcutaneous anesthesia was obtained using 1% preservative free Lidocaine .    3. Needle placement: Under intermittent fluoroscopic guidance a special NeuroTherm RF cannula was advanced to the periosteum where each medial branch nerve is located. The NeuroTherm RF frequency generator was attached using the connector cable.    4. Testing: Motor testing was then initiated to ensure close proximity of the RF needle tip to the medial branch nerve and to ensure that there  was no inadvertent stimulation of the motor fibers of the spinal nerve. The same technique was used to place the additional RF catheters at their appropriate target medial branches at the other treated levels.    5.Lesioning: Once all RF cannulas were in place and testing completed, 1 to 3 mL of 1% lidocaine  was injected at each level to anesthetize the medial branch(s). Then the NeuroTherm thermal ablator was initiated with RF temperature at the catheter tip registering 80 degrees for 90 seconds.  Once completed, the RF needles were removed and a Band-aid applied if needed.    6. Complications: There were no technical difficulties. The patient tolerated the procedure well and was discharged to the recovery room.    POST-PROCEDURAL CARE/ASSESSMENT OF ANALGESIC RESPONSE TO INJECTION:    - Please see nursing pre and post procedure assessment forms.   - The patient's response to the procedure was appropriate.   - A postprocedure follow-up was scheduled to evaluate patient's clinical status.    Fluoroscopy:  0.787 mGy/0.3 min

## 2024-03-24 NOTE — Interval H&P Note (Signed)
 Update History & Physical    The patient's History and Physical of March 03, 2024 was reviewed with the patient and I examined the patient. There was no change. The surgical site was confirmed by the patient and me.     She had the second set of right L2-4 medial branch nerve blocks on 03/03/24, which were diagnostic.    Plan: The risks, benefits, expected outcome, and alternative to the recommended procedure have been discussed with the patient. Patient understands and wants to proceed with the procedure.     Electronically signed by Florette JONETTA Diener, MD on 03/24/2024 at 10:52 AM

## 2024-03-24 NOTE — Discharge Instructions (Signed)
 [facility]  [address]  [place], [place]  [address]  [phone]    Thank you for choosing [facility].  Our team works together to offer you the best quality care and comprehensive pain management.    FOLLOW UP: April 26, 2024 at 10:15 AM with Alisa Siulinski, NP (Physiatry)    If you need to change the date or time of the appointment, please call the office.    TODAY'S PROCEDURE: right lumbar medial branch nerves rhizotomy    You received the following medications during the procedure:  lidocaine     DISCHARGE INSTRUCTIONS:  - Rest today  - Resume activity as tolerated  - Resume all medication  - You may shower and remove the bandage tomorrow  - Do not submerge the injection site in water (pool, hot tub, bath, etc) for 72 hours  - No heat for 24 hours to injection site(s)  - You may apply ice to the injection site(s) as needed    COMMON SYMPTOMS YOU MAY EXPERIENCE TODAY:    Heavy/Weak Limb(s)  Soreness at injection site    If these symptoms do not start resolving in 24 hours, please call the office.      Call the office if you have any questions or if you have any of the following:  - Excessive redness or swelling at injection site  - Excessive bleeding from injection site  - Excessive pain or headache unrelieved with your regular pain medication  - Increasing numbness/tingling  - New persistent weakness  - Fever greater than 101 degrees within 24 hours    IF YOU ARE EXPERIENCING SHORTNESS OF BREATH, INABILITY TO URINATE, NEW INABILITY TO WALK, OR NEW SWELLING, PLEASE SEEK EMERGENCY CARE (CALL 911) AS THESE COULD BE SIGNS OF SERIOUS COMPLICATIONS.

## 2024-03-28 ENCOUNTER — Encounter

## 2024-03-28 MED ORDER — APIXABAN 5 MG PO TABS
5 | ORAL_TABLET | Freq: Two times a day (BID) | ORAL | 3 refills | 30.00000 days | Status: AC
Start: 2024-03-28 — End: ?

## 2024-03-28 MED ORDER — METOPROLOL TARTRATE 25 MG PO TABS
25 | ORAL_TABLET | Freq: Two times a day (BID) | ORAL | 3 refills | 90.00000 days | Status: AC
Start: 2024-03-28 — End: ?
